# Patient Record
Sex: Female | Born: 1960 | Race: Black or African American | Hispanic: No | Marital: Single | Smoking: Never smoker
Health system: Southern US, Community
[De-identification: ages and names within clinical notes are randomized; demographics above are authoritative.]

## PROBLEM LIST (undated history)

## (undated) DIAGNOSIS — K219 Gastro-esophageal reflux disease without esophagitis: Secondary | ICD-10-CM

## (undated) DIAGNOSIS — R062 Wheezing: Secondary | ICD-10-CM

## (undated) DIAGNOSIS — M1990 Unspecified osteoarthritis, unspecified site: Secondary | ICD-10-CM

## (undated) DIAGNOSIS — N938 Other specified abnormal uterine and vaginal bleeding: Secondary | ICD-10-CM

## (undated) DIAGNOSIS — Z79818 Long term (current) use of other agents affecting estrogen receptors and estrogen levels: Secondary | ICD-10-CM

## (undated) DIAGNOSIS — Z923 Personal history of irradiation: Secondary | ICD-10-CM

## (undated) DIAGNOSIS — D649 Anemia, unspecified: Secondary | ICD-10-CM

## (undated) DIAGNOSIS — D250 Submucous leiomyoma of uterus: Secondary | ICD-10-CM

## (undated) DIAGNOSIS — D219 Benign neoplasm of connective and other soft tissue, unspecified: Secondary | ICD-10-CM

## (undated) DIAGNOSIS — N841 Polyp of cervix uteri: Secondary | ICD-10-CM

## (undated) DIAGNOSIS — I10 Essential (primary) hypertension: Secondary | ICD-10-CM

## (undated) DIAGNOSIS — C50911 Malignant neoplasm of unspecified site of right female breast: Secondary | ICD-10-CM

## (undated) DIAGNOSIS — T7840XA Allergy, unspecified, initial encounter: Secondary | ICD-10-CM

## (undated) DIAGNOSIS — R0981 Nasal congestion: Secondary | ICD-10-CM

## (undated) HISTORY — DX: Polyp of cervix uteri: N84.1

## (undated) HISTORY — PX: VAGINAL HYSTERECTOMY: SUR661

## (undated) HISTORY — DX: Nasal congestion: R09.81

## (undated) HISTORY — PX: TONSILLECTOMY: SUR1361

## (undated) HISTORY — DX: Allergy, unspecified, initial encounter: T78.40XA

## (undated) HISTORY — DX: Essential (primary) hypertension: I10

## (undated) HISTORY — DX: Wheezing: R06.2

## (undated) HISTORY — DX: Unspecified osteoarthritis, unspecified site: M19.90

## (undated) HISTORY — DX: Gastro-esophageal reflux disease without esophagitis: K21.9

## (undated) HISTORY — DX: Other specified abnormal uterine and vaginal bleeding: N93.8

## (undated) HISTORY — DX: Submucous leiomyoma of uterus: D25.0

## (undated) HISTORY — DX: Anemia, unspecified: D64.9

## (undated) HISTORY — DX: Benign neoplasm of connective and other soft tissue, unspecified: D21.9

## (undated) HISTORY — DX: Malignant neoplasm of unspecified site of right female breast: C50.911

---

## 1998-04-18 HISTORY — PX: TOOTH EXTRACTION: SUR596

## 2000-10-30 ENCOUNTER — Other Ambulatory Visit: Admission: RE | Admit: 2000-10-30 | Discharge: 2000-10-30 | Payer: Self-pay | Admitting: Obstetrics and Gynecology

## 2001-11-27 ENCOUNTER — Other Ambulatory Visit: Admission: RE | Admit: 2001-11-27 | Discharge: 2001-11-27 | Payer: Self-pay | Admitting: Obstetrics and Gynecology

## 2003-01-10 ENCOUNTER — Other Ambulatory Visit: Admission: RE | Admit: 2003-01-10 | Discharge: 2003-01-10 | Payer: Self-pay | Admitting: Obstetrics and Gynecology

## 2004-03-31 ENCOUNTER — Other Ambulatory Visit: Admission: RE | Admit: 2004-03-31 | Discharge: 2004-03-31 | Payer: Self-pay | Admitting: Obstetrics and Gynecology

## 2005-04-20 ENCOUNTER — Other Ambulatory Visit: Admission: RE | Admit: 2005-04-20 | Discharge: 2005-04-20 | Payer: Self-pay | Admitting: Obstetrics and Gynecology

## 2006-07-14 ENCOUNTER — Other Ambulatory Visit: Admission: RE | Admit: 2006-07-14 | Discharge: 2006-07-14 | Payer: Self-pay | Admitting: Obstetrics and Gynecology

## 2007-08-24 ENCOUNTER — Other Ambulatory Visit: Admission: RE | Admit: 2007-08-24 | Discharge: 2007-08-24 | Payer: Self-pay | Admitting: Obstetrics and Gynecology

## 2009-03-04 ENCOUNTER — Ambulatory Visit: Payer: Self-pay | Admitting: Obstetrics and Gynecology

## 2009-03-04 ENCOUNTER — Other Ambulatory Visit: Admission: RE | Admit: 2009-03-04 | Discharge: 2009-03-04 | Payer: Self-pay | Admitting: Obstetrics and Gynecology

## 2010-05-06 ENCOUNTER — Ambulatory Visit: Admit: 2010-05-06 | Payer: Self-pay | Admitting: Obstetrics and Gynecology

## 2010-06-17 ENCOUNTER — Encounter: Payer: Self-pay | Admitting: Obstetrics and Gynecology

## 2011-01-05 ENCOUNTER — Other Ambulatory Visit: Payer: Self-pay | Admitting: Obstetrics and Gynecology

## 2011-02-02 ENCOUNTER — Encounter: Payer: Self-pay | Admitting: Gynecology

## 2011-02-02 DIAGNOSIS — J45909 Unspecified asthma, uncomplicated: Secondary | ICD-10-CM | POA: Insufficient documentation

## 2011-02-15 ENCOUNTER — Ambulatory Visit: Payer: Self-pay | Admitting: Obstetrics and Gynecology

## 2011-05-06 ENCOUNTER — Other Ambulatory Visit: Payer: Self-pay | Admitting: Obstetrics and Gynecology

## 2011-05-10 ENCOUNTER — Other Ambulatory Visit: Payer: Self-pay | Admitting: Obstetrics and Gynecology

## 2011-06-28 ENCOUNTER — Encounter: Payer: Self-pay | Admitting: Obstetrics and Gynecology

## 2011-10-03 ENCOUNTER — Telehealth: Payer: Self-pay | Admitting: *Deleted

## 2011-10-03 NOTE — Telephone Encounter (Signed)
Pt calling with advice about irregular cycle, last OV 2010, pt informed to make annual with Dr.G, pt will call back and make ov.

## 2011-10-12 ENCOUNTER — Other Ambulatory Visit: Payer: Self-pay | Admitting: Obstetrics and Gynecology

## 2011-10-13 NOTE — Telephone Encounter (Signed)
Has CE scheduled 12/18/11.

## 2011-10-14 ENCOUNTER — Encounter: Payer: Self-pay | Admitting: Obstetrics and Gynecology

## 2011-10-17 ENCOUNTER — Other Ambulatory Visit (HOSPITAL_COMMUNITY)
Admission: RE | Admit: 2011-10-17 | Discharge: 2011-10-17 | Disposition: A | Discharge status: Home / Self Care | Payer: BC Managed Care – PPO | Source: Ambulatory Visit | Attending: Obstetrics and Gynecology | Admitting: Obstetrics and Gynecology

## 2011-10-17 ENCOUNTER — Ambulatory Visit (INDEPENDENT_AMBULATORY_CARE_PROVIDER_SITE_OTHER): Payer: BC Managed Care – PPO | Admitting: Obstetrics and Gynecology

## 2011-10-17 ENCOUNTER — Encounter: Payer: Self-pay | Admitting: Obstetrics and Gynecology

## 2011-10-17 VITALS — BP 130/80 | Ht 68.0 in | Wt 316.0 lb

## 2011-10-17 DIAGNOSIS — Z01419 Encounter for gynecological examination (general) (routine) without abnormal findings: Secondary | ICD-10-CM | POA: Insufficient documentation

## 2011-10-17 DIAGNOSIS — N949 Unspecified condition associated with female genital organs and menstrual cycle: Secondary | ICD-10-CM

## 2011-10-17 DIAGNOSIS — D649 Anemia, unspecified: Secondary | ICD-10-CM | POA: Insufficient documentation

## 2011-10-17 DIAGNOSIS — N938 Other specified abnormal uterine and vaginal bleeding: Secondary | ICD-10-CM

## 2011-10-17 HISTORY — PX: OTHER SURGICAL HISTORY: SHX169

## 2011-10-17 LAB — CBC WITH DIFFERENTIAL/PLATELET
Basophils Relative: 0 % (ref 0–1)
Eosinophils Absolute: 0.1 10*3/uL (ref 0.0–0.7)
Eosinophils Relative: 1 % (ref 0–5)
Hemoglobin: 8 g/dL — ABNORMAL LOW (ref 12.0–15.0)
MCH: 19.2 pg — ABNORMAL LOW (ref 26.0–34.0)
MCHC: 29.1 g/dL — ABNORMAL LOW (ref 30.0–36.0)
MCV: 66.1 fL — ABNORMAL LOW (ref 78.0–100.0)
Monocytes Relative: 6 % (ref 3–12)
Neutrophils Relative %: 66 % (ref 43–77)

## 2011-10-17 MED ORDER — ETHYNODIOL DIAC-ETH ESTRADIOL 1-35 MG-MCG PO TABS: 1.0000 | ORAL_TABLET | Freq: Every day | ORAL | Status: DC

## 2011-10-17 NOTE — Progress Notes (Signed)
Patient came to see me today for her annual GYN exam. We had last seen her in the fall of 2010. We had her on Kelnor for birth control and cycle control. She was seen after this at the [facility]. Initially they switched her to Micronor due to her age. She could not tolerate due to nausea and bleeding. They suggested she try  a Mirena IUD. She was not comfortable with that due to previous history of PID. She then called our office and spoke with Harriett Sine. Harriett Sine spoke with me and we switched her to Sprintec. She says that Sprintec is not as good as the [employer] was. She is having extremely heavy periods with clotting and dysmenorrhea. It is gone especially worse this year. She became anemic and her hemoglobin got as low as 7. She has been on ferrous sulfate and went back up to 10.3. She says it was rechecked and the stropped to 9. She actually had a syncopal episode and fainted in January, 2013. She thinks she's become anemic again due to fatigue, easy bruising, and ice craving. She has had a mammogram this week. We did a Pap smear in November, 2010 which was normal. She also had a Pap smear at the health department in April 2012 which was normal. She is not having pelvic pain.  Physical examination: Kennon Portela present. HEENT within normal limits. Neck: Thyroid not large. No masses. Supraclavicular nodes: not enlarged. Breasts: Examined in both sitting and lying  position. No skin changes and no masses. Abdomen: Soft no guarding rebound or masses or hernia. Pelvic: External: Within normal limits. BUS: Within normal limits. Vaginal:within normal limits. Good estrogen effect. No evidence of cystocele rectocele or enterocele. Cervix: large polyp extruding from the os. Uterus: Normal size and shape. Adnexa: No masses. Rectovaginal exam: Confirmatory and negative.  Assessment: #1. Menorrhagia with dysmenorrhea #2. Endocervical polyp  Plan: Polyp removed and sent to pathology. [facility] scheduled. CBC and  TSH done. Information on endometrial ablation given the patient. Patient restarted on Kelnor. Extremities: Within normal limits.

## 2011-10-18 ENCOUNTER — Other Ambulatory Visit: Payer: Self-pay | Admitting: Obstetrics and Gynecology

## 2011-10-18 DIAGNOSIS — D649 Anemia, unspecified: Secondary | ICD-10-CM

## 2011-10-18 HISTORY — DX: Anemia, unspecified: D64.9

## 2011-10-18 LAB — URINALYSIS W MICROSCOPIC + REFLEX CULTURE
Glucose, UA: NEGATIVE mg/dL
Hgb urine dipstick: NEGATIVE
Leukocytes, UA: NEGATIVE
Protein, ur: NEGATIVE mg/dL

## 2011-10-19 ENCOUNTER — Other Ambulatory Visit: Payer: Self-pay | Admitting: *Deleted

## 2011-10-19 DIAGNOSIS — N390 Urinary tract infection, site not specified: Secondary | ICD-10-CM

## 2011-10-19 MED ORDER — NITROFURANTOIN MONOHYD MACRO 100 MG PO CAPS: 100.0000 mg | ORAL_CAPSULE | Freq: Two times a day (BID) | ORAL | Status: AC

## 2011-10-19 NOTE — Progress Notes (Signed)
rx called into [facility] [phone].

## 2011-10-21 LAB — URINE CULTURE: Colony Count: >=100000

## 2011-10-25 ENCOUNTER — Other Ambulatory Visit: Payer: Self-pay | Admitting: Obstetrics and Gynecology

## 2011-10-25 DIAGNOSIS — N938 Other specified abnormal uterine and vaginal bleeding: Secondary | ICD-10-CM

## 2011-10-26 ENCOUNTER — Ambulatory Visit: Payer: BC Managed Care – PPO | Admitting: Obstetrics and Gynecology

## 2011-10-26 ENCOUNTER — Other Ambulatory Visit: Payer: BC Managed Care – PPO

## 2011-10-26 ENCOUNTER — Encounter: Payer: Self-pay | Admitting: Obstetrics and Gynecology

## 2011-10-31 ENCOUNTER — Encounter: Payer: Self-pay | Admitting: *Deleted

## 2011-10-31 NOTE — Progress Notes (Signed)
Patient ID: Kayla Neal, female   DOB: 04-02-1961, 51 y.o.   MRN: [id number] Order signed and faxed to [facility] for diag. Mammo. Right breast calcifications.

## 2011-11-03 ENCOUNTER — Encounter (INDEPENDENT_AMBULATORY_CARE_PROVIDER_SITE_OTHER): Payer: Self-pay

## 2011-11-07 ENCOUNTER — Encounter (INDEPENDENT_AMBULATORY_CARE_PROVIDER_SITE_OTHER): Payer: Self-pay

## 2011-11-11 ENCOUNTER — Encounter (INDEPENDENT_AMBULATORY_CARE_PROVIDER_SITE_OTHER): Payer: Self-pay | Admitting: Surgery

## 2011-11-11 ENCOUNTER — Other Ambulatory Visit (INDEPENDENT_AMBULATORY_CARE_PROVIDER_SITE_OTHER): Payer: Self-pay | Admitting: Surgery

## 2011-11-11 ENCOUNTER — Ambulatory Visit (INDEPENDENT_AMBULATORY_CARE_PROVIDER_SITE_OTHER): Payer: BC Managed Care – PPO | Admitting: Surgery

## 2011-11-11 ENCOUNTER — Encounter (HOSPITAL_BASED_OUTPATIENT_CLINIC_OR_DEPARTMENT_OTHER): Payer: Self-pay | Admitting: *Deleted

## 2011-11-11 VITALS — BP 130/72 | HR 70 | Temp 97.6°F | Resp 16 | Ht 68.0 in | Wt 316.0 lb

## 2011-11-11 DIAGNOSIS — R928 Other abnormal and inconclusive findings on diagnostic imaging of breast: Secondary | ICD-10-CM

## 2011-11-11 DIAGNOSIS — R921 Mammographic calcification found on diagnostic imaging of breast: Secondary | ICD-10-CM

## 2011-11-11 DIAGNOSIS — C50911 Malignant neoplasm of unspecified site of right female breast: Secondary | ICD-10-CM | POA: Insufficient documentation

## 2011-11-11 HISTORY — DX: Malignant neoplasm of unspecified site of right female breast: C50.911

## 2011-11-11 NOTE — Patient Instructions (Signed)
We will schedule surgery to remove the area of calcifications in your right breast

## 2011-11-11 NOTE — Progress Notes (Signed)
Kayla Neal DOB: 1961-03-06 MRN: [id number]                                                                                      DATE: 11/11/2011  PCP: [name], MD Referring Provider: No ref. provider found  IMPRESSION:  Right breast calcifications, indeterminate but suspicious for DCIS  PLAN:   Wire localized excision of right breast calcifications under general anesthesia as an outpatient                 CC:  Chief Complaint  Patient presents with  . Breast Problem    Breast calcifications, right    HPI:  Kayla Neal is a 51 y.o.  female who presents for evaluation of Recently found right breast calcifications on a routine mammogram. These are moderately suspicious linear calcifications. A needle core biopsy was not possible because the patient's weight is 2 large for the stereotactic wire-guided table. Therefore surgical biopsy was recommended. Of note is that she has a mother who had colon cancer in her 36s, and a maternal aunt with ovarian cancer in a paternal aunt with breast cancer, both postmenopausal  PMH:  has a past medical history of Asthma; Anemia; GERD (gastroesophageal reflux disease); Hypertension; Nasal congestion; Wheezing; and Arthritis.  PSH:   has past surgical history that includes Tooth extraction (2000).  ALLERGIES:   Allergies  Allergen Reactions  . Lisinopril Nausea And Vomiting and Other (See Comments)    Severe stomach cramps    MEDICATIONS: Current outpatient prescriptions:Ferrous Sulfate (IRON) 325 (65 FE) MG TABS, Take by mouth 3 (three) times daily., Disp: , Rfl: ;  Multiple Vitamins-Minerals (ONE-A-DAY MENOPAUSE FORMULA PO), Take by mouth daily., Disp: , Rfl: ;  ALBUTEROL IN, Inhale into the lungs.  , Disp: , Rfl: ;  ethynodiol-ethinyl estradiol (KELNOR 1/35) 1-35 MG-MCG tablet, Take 1 tablet by mouth daily., Disp: 1 Package, Rfl: 13 EVENING PRIMROSE OIL PO, Take by mouth., Disp: , Rfl: ;  Flaxseed, Linseed, (FLAXSEED OIL PO),  Take by mouth.  , Disp: , Rfl:   ROS: Point review of systems was negative except for some nasal congestion and wheezing associated with her mild asthma  EXAM:   VITAL SIGNS: BP 130/72  Pulse 70  Temp 97.6 F (36.4 C) (Temporal)  Resp 16  Ht 5\' 8"  (1.727 m)  Wt 316 lb (143.337 kg)  BMI 48.05 kg/m2  LMP 10/09/2011 GENERAL:  The patient is alert, oriented, and generally healthy-appearing, NAD. Mood and affect are normal.  HEENT:  The head is normocephalic, the eyes nonicteric, the pupils were round regular and equal. EOMs are normal. Pharynx normal. Dentition good.  NECK:  The neck is supple and there are no masses or thyromegaly.  LUNGS: Normal respirations and clear to auscultation.  HEART: Regular rhythm, with no murmurs rubs or gallops. Pulses are intact carotid dorsalis pedis and posterior tibial. No significant varicosities are noted.  BREASTS:  The breasts are symmetric in appearance. They're normal to inspection and palpation. There is no evidence of any suspicious areas.  LYMPHATICS: There is no axillary or supraventricular adenopathy noted.  ABDOMEN: Soft, flat, and nontender. No  masses or organomegaly is noted. No hernias are noted. Bowel sounds are normal.  EXTREMITIES:  Good range of motion, no edema.   DATA REVIEWED:  I have reviewed the mammogram reports, some laboratory studies are found electronic medical record, and office visit with her gynecologist.    Currie Paris 11/11/2011  CC: No ref. provider found, [name], MD

## 2011-11-14 ENCOUNTER — Encounter (HOSPITAL_BASED_OUTPATIENT_CLINIC_OR_DEPARTMENT_OTHER): Payer: Self-pay | Admitting: Certified Registered"

## 2011-11-14 ENCOUNTER — Ambulatory Visit (HOSPITAL_BASED_OUTPATIENT_CLINIC_OR_DEPARTMENT_OTHER)
Admission: RE | Admit: 2011-11-14 | Discharge: 2011-11-14 | Disposition: A | Discharge status: Home / Self Care | Payer: BC Managed Care – PPO | Source: Ambulatory Visit | Attending: Surgery | Admitting: Surgery

## 2011-11-14 ENCOUNTER — Ambulatory Visit (HOSPITAL_BASED_OUTPATIENT_CLINIC_OR_DEPARTMENT_OTHER): Payer: BC Managed Care – PPO | Admitting: Certified Registered"

## 2011-11-14 ENCOUNTER — Encounter (HOSPITAL_BASED_OUTPATIENT_CLINIC_OR_DEPARTMENT_OTHER): Payer: Self-pay | Admitting: *Deleted

## 2011-11-14 ENCOUNTER — Encounter (HOSPITAL_BASED_OUTPATIENT_CLINIC_OR_DEPARTMENT_OTHER)
Admission: RE | Disposition: A | Discharge status: Home / Self Care | Payer: Self-pay | Source: Ambulatory Visit | Attending: Surgery

## 2011-11-14 ENCOUNTER — Ambulatory Visit
Admission: RE | Admit: 2011-11-14 | Discharge: 2011-11-14 | Disposition: A | Discharge status: Home / Self Care | Payer: BC Managed Care – PPO | Source: Ambulatory Visit | Attending: Surgery | Admitting: Surgery

## 2011-11-14 DIAGNOSIS — D0590 Unspecified type of carcinoma in situ of unspecified breast: Secondary | ICD-10-CM

## 2011-11-14 DIAGNOSIS — R921 Mammographic calcification found on diagnostic imaging of breast: Secondary | ICD-10-CM

## 2011-11-14 DIAGNOSIS — K219 Gastro-esophageal reflux disease without esophagitis: Secondary | ICD-10-CM | POA: Insufficient documentation

## 2011-11-14 DIAGNOSIS — I10 Essential (primary) hypertension: Secondary | ICD-10-CM | POA: Insufficient documentation

## 2011-11-14 HISTORY — PX: BREAST BIOPSY: SHX20

## 2011-11-14 HISTORY — PX: BREAST LUMPECTOMY: SHX2

## 2011-11-14 SURGERY — BREAST BIOPSY WITH NEEDLE LOCALIZATION
Anesthesia: General | Site: Breast | Laterality: Right | Wound class: Clean

## 2011-11-14 MED ORDER — PROPOFOL 10 MG/ML IV EMUL
INTRAVENOUS | Status: DC | PRN
  Administered 2011-11-14: 260 mg via INTRAVENOUS
  Administered 2011-11-14: 40 mg via INTRAVENOUS

## 2011-11-14 MED ORDER — MIDAZOLAM HCL 5 MG/5ML IJ SOLN
INTRAMUSCULAR | Status: DC | PRN
  Administered 2011-11-14: 2 mg via INTRAVENOUS

## 2011-11-14 MED ORDER — CHLORHEXIDINE GLUCONATE 4 % EX LIQD: 1.0000 "application " | Freq: Once | CUTANEOUS | Status: DC

## 2011-11-14 MED ORDER — HYDROMORPHONE HCL PF 1 MG/ML IJ SOLN
0.2500 mg | INTRAMUSCULAR | Status: DC | PRN
  Administered 2011-11-14: 0.25 mg via INTRAVENOUS

## 2011-11-14 MED ORDER — LIDOCAINE HCL (CARDIAC) 20 MG/ML IV SOLN
INTRAVENOUS | Status: DC | PRN
  Administered 2011-11-14: 100 mg via INTRAVENOUS

## 2011-11-14 MED ORDER — ONDANSETRON HCL 4 MG/2ML IJ SOLN
INTRAMUSCULAR | Status: DC | PRN
  Administered 2011-11-14: 4 mg via INTRAVENOUS

## 2011-11-14 MED ORDER — PROMETHAZINE HCL 25 MG/ML IJ SOLN: 6.2500 mg | INTRAMUSCULAR | Status: DC | PRN

## 2011-11-14 MED ORDER — BUPIVACAINE HCL (PF) 0.25 % IJ SOLN
INTRAMUSCULAR | Status: DC | PRN
  Administered 2011-11-14: 30 mL

## 2011-11-14 MED ORDER — OXYCODONE-ACETAMINOPHEN 5-325 MG PO TABS: 1.0000 | ORAL_TABLET | ORAL | Status: DC | PRN

## 2011-11-14 MED ORDER — FENTANYL CITRATE 0.05 MG/ML IJ SOLN: 50.0000 ug | INTRAMUSCULAR | Status: DC | PRN

## 2011-11-14 MED ORDER — FENTANYL CITRATE 0.05 MG/ML IJ SOLN
INTRAMUSCULAR | Status: DC | PRN
  Administered 2011-11-14: 25 ug via INTRAVENOUS
  Administered 2011-11-14: 100 ug via INTRAVENOUS
  Administered 2011-11-14: 25 ug via INTRAVENOUS

## 2011-11-14 MED ORDER — LACTATED RINGERS IV SOLN
INTRAVENOUS | Status: DC
  Administered 2011-11-14: 10:00:00 via INTRAVENOUS

## 2011-11-14 MED ORDER — DEXTROSE 5 % IV SOLN
3.0000 g | INTRAVENOUS | Status: AC
  Administered 2011-11-14: 3 g via INTRAVENOUS

## 2011-11-14 MED ORDER — DEXAMETHASONE SODIUM PHOSPHATE 4 MG/ML IJ SOLN
INTRAMUSCULAR | Status: DC | PRN
  Administered 2011-11-14: 8 mg via INTRAVENOUS

## 2011-11-14 MED ORDER — LORAZEPAM 2 MG/ML IJ SOLN: 1.0000 mg | Freq: Once | INTRAMUSCULAR | Status: DC | PRN

## 2011-11-14 MED ORDER — MIDAZOLAM HCL 2 MG/2ML IJ SOLN: 1.0000 mg | INTRAMUSCULAR | Status: DC | PRN

## 2011-11-14 SURGICAL SUPPLY — 52 items
ADH SKN CLS APL DERMABOND .7 (GAUZE/BANDAGES/DRESSINGS) ×1
APPLICATOR COTTON TIP 6IN STRL (MISCELLANEOUS) IMPLANT
BINDER BREAST LRG (GAUZE/BANDAGES/DRESSINGS) IMPLANT
BINDER BREAST MEDIUM (GAUZE/BANDAGES/DRESSINGS) IMPLANT
BINDER BREAST XLRG (GAUZE/BANDAGES/DRESSINGS) IMPLANT
BINDER BREAST XXLRG (GAUZE/BANDAGES/DRESSINGS) IMPLANT
BLADE HEX COATED 2.75 (ELECTRODE) ×2 IMPLANT
BLADE SURG 15 STRL LF DISP TIS (BLADE) ×1 IMPLANT
BLADE SURG 15 STRL SS (BLADE) ×2
CANISTER SUCTION 1200CC (MISCELLANEOUS) ×2 IMPLANT
CHLORAPREP W/TINT 26ML (MISCELLANEOUS) ×2 IMPLANT
CLIP TI MEDIUM 6 (CLIP) IMPLANT
CLIP TI WIDE RED SMALL 6 (CLIP) IMPLANT
CLOTH BEACON ORANGE TIMEOUT ST (SAFETY) ×2 IMPLANT
COVER MAYO STAND STRL (DRAPES) ×2 IMPLANT
COVER TABLE BACK 60X90 (DRAPES) ×2 IMPLANT
DECANTER SPIKE VIAL GLASS SM (MISCELLANEOUS) ×1 IMPLANT
DERMABOND ADVANCED (GAUZE/BANDAGES/DRESSINGS) ×1
DERMABOND ADVANCED .7 DNX12 (GAUZE/BANDAGES/DRESSINGS) ×1 IMPLANT
DEVICE DUBIN W/COMP PLATE 8390 (MISCELLANEOUS) ×1 IMPLANT
DRAPE LAPAROTOMY TRNSV 102X78 (DRAPE) ×2 IMPLANT
DRAPE UTILITY XL STRL (DRAPES) ×2 IMPLANT
ELECT REM PT RETURN 9FT ADLT (ELECTROSURGICAL) ×2
ELECTRODE REM PT RTRN 9FT ADLT (ELECTROSURGICAL) ×1 IMPLANT
GLOVE BIO SURGEON STRL SZ 6.5 (GLOVE) ×1 IMPLANT
GLOVE BIO SURGEON STRL SZ7 (GLOVE) ×1 IMPLANT
GLOVE BIOGEL PI IND STRL 7.0 (GLOVE) IMPLANT
GLOVE BIOGEL PI IND STRL 7.5 (GLOVE) IMPLANT
GLOVE BIOGEL PI INDICATOR 7.0 (GLOVE) ×1
GLOVE BIOGEL PI INDICATOR 7.5 (GLOVE) ×1
GLOVE EUDERMIC 7 POWDERFREE (GLOVE) ×2 IMPLANT
GOWN PREVENTION PLUS XLARGE (GOWN DISPOSABLE) ×4 IMPLANT
GOWN PREVENTION PLUS XXLARGE (GOWN DISPOSABLE) ×1 IMPLANT
KIT MARKER MARGIN INK (KITS) ×1 IMPLANT
NDL HYPO 25X1 1.5 SAFETY (NEEDLE) ×1 IMPLANT
NEEDLE HYPO 25X1 1.5 SAFETY (NEEDLE) ×2 IMPLANT
NS IRRIG 1000ML POUR BTL (IV SOLUTION) IMPLANT
PACK BASIN DAY SURGERY FS (CUSTOM PROCEDURE TRAY) ×2 IMPLANT
PENCIL BUTTON HOLSTER BLD 10FT (ELECTRODE) ×2 IMPLANT
SLEEVE SCD COMPRESS KNEE MED (MISCELLANEOUS) ×2 IMPLANT
SPONGE GAUZE 4X4 12PLY (GAUZE/BANDAGES/DRESSINGS) IMPLANT
SPONGE INTESTINAL PEANUT (DISPOSABLE) IMPLANT
SPONGE LAP 4X18 X RAY DECT (DISPOSABLE) ×2 IMPLANT
STAPLER VISISTAT 35W (STAPLE) IMPLANT
SUT MNCRL AB 4-0 PS2 18 (SUTURE) ×2 IMPLANT
SUT SILK 0 TIES 10X30 (SUTURE) IMPLANT
SUT VICRYL 3-0 CR8 SH (SUTURE) ×2 IMPLANT
SYR CONTROL 10ML LL (SYRINGE) ×2 IMPLANT
TOWEL OR NON WOVEN STRL DISP B (DISPOSABLE) ×2 IMPLANT
TUBE CONNECTING 20X1/4 (TUBING) ×2 IMPLANT
WATER STERILE IRR 1000ML POUR (IV SOLUTION) ×1 IMPLANT
YANKAUER SUCT BULB TIP NO VENT (SUCTIONS) ×2 IMPLANT

## 2011-11-14 NOTE — H&P (View-Only) (Signed)
NAME: Kayla Neal DOB: 12/08/1960 MRN: [id number]                                                                                      DATE: 11/11/2011  PCP: GOTTSEGEN,DANIEL L, MD Referring Provider: No ref. provider found  IMPRESSION:  Right breast calcifications, indeterminate but suspicious for DCIS  PLAN:   Wire localized excision of right breast calcifications under general anesthesia as an outpatient                 CC:  Chief Complaint  Patient presents with  . Breast Problem    Breast calcifications, right    HPI:  Kayla Neal is a 51 y.o.  female who presents for evaluation of Recently found right breast calcifications on a routine mammogram. These are moderately suspicious linear calcifications. A needle core biopsy was not possible because the patient's weight is 2 large for the stereotactic wire-guided table. Therefore surgical biopsy was recommended. Of note is that she has a mother who had colon cancer in her 70s, and a maternal aunt with ovarian cancer in a paternal aunt with breast cancer, both postmenopausal  PMH:  has a past medical history of Asthma; Anemia; GERD (gastroesophageal reflux disease); Hypertension; Nasal congestion; Wheezing; and Arthritis.  PSH:   has past surgical history that includes Tooth extraction (2000).  ALLERGIES:   Allergies  Allergen Reactions  . Lisinopril Nausea And Vomiting and Other (See Comments)    Severe stomach cramps    MEDICATIONS: Current outpatient prescriptions:Ferrous Sulfate (IRON) 325 (65 FE) MG TABS, Take by mouth 3 (three) times daily., Disp: , Rfl: ;  Multiple Vitamins-Minerals (ONE-A-DAY MENOPAUSE FORMULA PO), Take by mouth daily., Disp: , Rfl: ;  ALBUTEROL IN, Inhale into the lungs.  , Disp: , Rfl: ;  ethynodiol-ethinyl estradiol (KELNOR 1/35) 1-35 MG-MCG tablet, Take 1 tablet by mouth daily., Disp: 1 Package, Rfl: 13 EVENING PRIMROSE OIL PO, Take by mouth., Disp: , Rfl: ;  Flaxseed, Linseed, (FLAXSEED OIL PO),  Take by mouth.  , Disp: , Rfl:   ROS: Point review of systems was negative except for some nasal congestion and wheezing associated with her mild asthma  EXAM:   VITAL SIGNS: BP 130/72  Pulse 70  Temp 97.6 F (36.4 C) (Temporal)  Resp 16  Ht 5' 8" (1.727 m)  Wt 316 lb (143.337 kg)  BMI 48.05 kg/m2  LMP 10/09/2011 GENERAL:  The patient is alert, oriented, and generally healthy-appearing, NAD. Mood and affect are normal.  HEENT:  The head is normocephalic, the eyes nonicteric, the pupils were round regular and equal. EOMs are normal. Pharynx normal. Dentition good.  NECK:  The neck is supple and there are no masses or thyromegaly.  LUNGS: Normal respirations and clear to auscultation.  HEART: Regular rhythm, with no murmurs rubs or gallops. Pulses are intact carotid dorsalis pedis and posterior tibial. No significant varicosities are noted.  BREASTS:  The breasts are symmetric in appearance. They're normal to inspection and palpation. There is no evidence of any suspicious areas.  LYMPHATICS: There is no axillary or supraventricular adenopathy noted.  ABDOMEN: Soft, flat, and nontender. No   masses or organomegaly is noted. No hernias are noted. Bowel sounds are normal.  EXTREMITIES:  Good range of motion, no edema.   DATA REVIEWED:  I have reviewed the mammogram reports, some laboratory studies are found electronic medical record, and office visit with her gynecologist.    Yukari Flax J 11/11/2011  CC: No ref. provider found, GOTTSEGEN,DANIEL L, MD        

## 2011-11-14 NOTE — Anesthesia Procedure Notes (Signed)
Procedure Name: LMA Insertion Date/Time: 11/14/2011 11:02 AM Performed by: Verlan Friends Pre-anesthesia Checklist: Patient identified, Emergency Drugs available, Suction available, Patient being monitored and Timeout performed Patient Re-evaluated:Patient Re-evaluated prior to inductionOxygen Delivery Method: Circle System Utilized Preoxygenation: Pre-oxygenation with 100% oxygen Intubation Type: IV induction Ventilation: Mask ventilation without difficulty LMA: LMA with gastric port inserted LMA Size: 4.0 Number of attempts: 1 Placement Confirmation: positive ETCO2 Tube secured with: Tape Dental Injury: Teeth and Oropharynx as per pre-operative assessment

## 2011-11-14 NOTE — Op Note (Signed)
KIMIE PIDCOCK  [birth year]  [id number]  11/14/2011   Preoperative diagnosis: Suspicious calcifications, upper inner quadrant right breast  Postoperative diagnosis: same  Procedure: Wire loc excisional biopsy  Surgeon: Currie Paris, MD, FACS  Assistant: Charissa Bash, MS III  Anesthesia: General  Clinical History and Indications: this patient presents for a guidewire localized excision of a reght breast area of suspicious calcifications.  Description of procedure: The patient was seen in the holding area and the plans for the procedure reviewed. The right breast was marked as the operative side. The wire localizing films were reviewed.  The patient was taken to the operating room and after satisfactory general anesthesia had been obtained the right breast was prepped and draped and the timeout was performed.  The incision was made over the presumed area of the mass. There were two wires to bracket the area, both entered medial and traced laterally.Skin flaps were raised and using cautery the area was completely excised. Bleeders were controlled with either cautery or sutures as needed.  After achieving hemostasis, the incision was closed with 3-0 Vicryl, 4-0 Monocryl subcuticular, and Dermabond. 30 cc of 0.25% Marcaine was placed before closing.  Specimen mammogram was reviewed and the area in question appeared to have been removed  The patient tolerated the procedure well. There were no operative complications. All counts were correct.   EBL: Minimal  Currie Paris, MD, FACS 11/14/2011 11:54 AM

## 2011-11-14 NOTE — Transfer of Care (Signed)
Immediate Anesthesia Transfer of Care Note  Patient: Kayla Neal  Procedure(s) Performed: Procedure(s) (LRB): BREAST BIOPSY WITH NEEDLE LOCALIZATION (Right)  Patient Location: PACU  Anesthesia Type: General  Level of Consciousness: awake, alert , oriented and patient cooperative  Airway & Oxygen Therapy: Patient Spontanous Breathing and Patient connected to face mask oxygen  Post-op Assessment: Report given to PACU RN and Post -op Vital signs reviewed and stable  Post vital signs: Reviewed and stable  Complications: No apparent anesthesia complications

## 2011-11-14 NOTE — Progress Notes (Signed)
Dr Jamey Ripa notified of pt request for change of pain medication from roxicet to [facility]. Dr Jamey Ripa will call script in to [facility] pharmacy in [place].

## 2011-11-14 NOTE — Anesthesia Preprocedure Evaluation (Signed)
Anesthesia Evaluation  Patient identified by MRN, date of birth, ID band Patient awake    Reviewed: Allergy & Precautions, H&P , NPO status , Patient's Chart, lab work & pertinent test results  Airway Mallampati: II TM Distance: >3 FB Neck ROM: Full    Dental   Pulmonary asthma ,    Pulmonary exam normal       Cardiovascular hypertension,     Neuro/Psych    GI/Hepatic GERD-  ,  Endo/Other  Morbid obesity  Renal/GU      Musculoskeletal   Abdominal (+) + obese,   Peds  Hematology   Anesthesia Other Findings   Reproductive/Obstetrics                           Anesthesia Physical Anesthesia Plan  ASA: II  Anesthesia Plan: General   Post-op Pain Management:    Induction: Intravenous  Airway Management Planned: LMA  Additional Equipment:   Intra-op Plan:   Post-operative Plan: Extubation in OR  Informed Consent: I have reviewed the patients History and Physical, chart, labs and discussed the procedure including the risks, benefits and alternatives for the proposed anesthesia with the patient or authorized representative who has indicated his/her understanding and acceptance.     Plan Discussed with: CRNA and Surgeon  Anesthesia Plan Comments:         Anesthesia Quick Evaluation

## 2011-11-14 NOTE — Interval H&P Note (Signed)
History and Physical Interval Note:  11/14/2011 10:35 AM  Kayla Neal  has presented today for surgery, with the diagnosis of right breast calcification  The various methods of treatment have been discussed with the patient and family. After consideration of risks, benefits and other options for treatment, the patient has consented to  Procedure(s) (LRB): BREAST BIOPSY WITH NEEDLE LOCALIZATION (Right) as a surgical intervention .  The patient's history has been reviewed, patient examined, no change in status, stable for surgery.  I have reviewed the patient's chart and labs.  Questions were answered to the patient's satisfaction.   Right breast marked as the operative side. Wire loc films reviewed  [employer]

## 2011-11-14 NOTE — Anesthesia Postprocedure Evaluation (Signed)
  Anesthesia Post-op Note  Patient: Kayla Neal  Procedure(s) Performed: Procedure(s) (LRB): BREAST BIOPSY WITH NEEDLE LOCALIZATION (Right)  Patient Location: PACU  Anesthesia Type: General  Level of Consciousness: awake  Airway and Oxygen Therapy: Patient Spontanous Breathing  Post-op Pain: mild  Post-op Assessment: Post-op Vital signs reviewed, Patient's Cardiovascular Status Stable, Respiratory Function Stable, Patent Airway, No signs of Nausea or vomiting and Pain level controlled  Post-op Vital Signs: stable  Complications: No apparent anesthesia complications

## 2011-11-15 ENCOUNTER — Telehealth (INDEPENDENT_AMBULATORY_CARE_PROVIDER_SITE_OTHER): Payer: Self-pay | Admitting: Surgery

## 2011-11-15 ENCOUNTER — Encounter (HOSPITAL_BASED_OUTPATIENT_CLINIC_OR_DEPARTMENT_OTHER): Payer: Self-pay | Admitting: Surgery

## 2011-11-15 DIAGNOSIS — C50911 Malignant neoplasm of unspecified site of right female breast: Secondary | ICD-10-CM

## 2011-11-15 NOTE — Telephone Encounter (Signed)
Tried to call patient to give her an appt for next week. No answer. Referral made to [facility].

## 2011-11-15 NOTE — Addendum Note (Signed)
Addended byLiliana Cline on: 11/15/2011 12:55 PM   Modules accepted: Orders

## 2011-11-15 NOTE — Telephone Encounter (Signed)
I spoke with her today, the day after surgery. She is doing well except for problems with the incision or problems with pain.  I reviewed her pathology report. 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 Margins were negative although the deep margin was 2 mm. I reviewed that with her.  We will go ahead and make medical and radiation oncology consultation appointments. She is scheduled to see me in about 3 weeks I'll try to move that up for next week so we can discuss in person the pathology report

## 2011-11-18 ENCOUNTER — Encounter: Payer: Self-pay | Admitting: Obstetrics and Gynecology

## 2011-11-23 ENCOUNTER — Ambulatory Visit (INDEPENDENT_AMBULATORY_CARE_PROVIDER_SITE_OTHER): Payer: BC Managed Care – PPO | Admitting: Surgery

## 2011-11-23 ENCOUNTER — Encounter (INDEPENDENT_AMBULATORY_CARE_PROVIDER_SITE_OTHER): Payer: Self-pay | Admitting: Surgery

## 2011-11-23 VITALS — BP 148/90 | HR 76 | Resp 18 | Ht 68.0 in | Wt 315.0 lb

## 2011-11-23 DIAGNOSIS — C50919 Malignant neoplasm of unspecified site of unspecified female breast: Secondary | ICD-10-CM

## 2011-11-23 DIAGNOSIS — C50911 Malignant neoplasm of unspecified site of right female breast: Secondary | ICD-10-CM

## 2011-11-23 NOTE — Addendum Note (Signed)
Addended byLiliana Cline on: 11/23/2011 04:27 PM   Modules accepted: Orders

## 2011-11-23 NOTE — Patient Instructions (Addendum)
I need to see you again in about 3 weeks for a followup visit. We will try to arrange medical and radiation Oncologyconsultations for you

## 2011-11-23 NOTE — Progress Notes (Signed)
NAME: Kayla Neal                                            DOB: 1961/03/01 DATE: 11/23/2011                                                  MRN: [id number]  CC: Post op   HPI: This patient comes in for post op follow-up.Sheunderwent Wire localized excision of some right breast calcifications upper inner quadrant on 11/14/2011. She feels that she is doing well.  PE:  VITAL SIGNS: BP 148/90  Pulse 76  Resp 18  Ht 5\' 8"  (1.727 m)  Wt 315 lb (142.883 kg)  BMI 47.90 kg/m2  LMP 10/30/2011  General: The patient appears to be healthy, NAD The incision is healing nicely with no evidence of any problems.  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    PLAN: She'll have another postop visit with me in about 3 weeks. I reviewed the pathology report and gave her a copy. We will arrange both medical and radiation therapy consultations for her.

## 2011-11-28 ENCOUNTER — Ambulatory Visit (INDEPENDENT_AMBULATORY_CARE_PROVIDER_SITE_OTHER): Payer: Self-pay | Admitting: Surgery

## 2011-12-01 ENCOUNTER — Ambulatory Visit (INDEPENDENT_AMBULATORY_CARE_PROVIDER_SITE_OTHER): Payer: BC Managed Care – PPO | Admitting: Obstetrics and Gynecology

## 2011-12-01 ENCOUNTER — Encounter: Payer: Self-pay | Admitting: Obstetrics and Gynecology

## 2011-12-01 VITALS — BP 124/78

## 2011-12-01 DIAGNOSIS — N841 Polyp of cervix uteri: Secondary | ICD-10-CM | POA: Insufficient documentation

## 2011-12-01 DIAGNOSIS — N938 Other specified abnormal uterine and vaginal bleeding: Secondary | ICD-10-CM

## 2011-12-01 DIAGNOSIS — N949 Unspecified condition associated with female genital organs and menstrual cycle: Secondary | ICD-10-CM

## 2011-12-01 LAB — CBC WITH DIFFERENTIAL/PLATELET
Eosinophils Absolute: 0 10*3/uL (ref 0.0–0.7)
Eosinophils Relative: 1 % (ref 0–5)
Lymphs Abs: 1 10*3/uL (ref 0.7–4.0)
MCH: 23.6 pg — ABNORMAL LOW (ref 26.0–34.0)
MCV: 76 fL — ABNORMAL LOW (ref 78.0–100.0)
Platelets: 314 10*3/uL (ref 150–400)
RBC: 4.41 MIL/uL (ref 3.87–5.11)

## 2011-12-01 MED ORDER — MEGESTROL ACETATE 40 MG PO TABS: 40.0000 mg | ORAL_TABLET | Freq: Four times a day (QID) | ORAL | Status: DC

## 2011-12-01 NOTE — Progress Notes (Signed)
Patient came to see me today because of sudden onset of heavy bleeding. She has been intermittently anemic over the past year with her low hemoglobin being 7 in  January. She had been on one iron a day and I had increased her in July to 3 a day. Over the past several months her hemoglobin had bounced  around from 8 tiv10. It was last checked at the end of July when she had a breast biopsy which showed ductal carcinoma in situ. That day it was 10.3. She has been on birth control pills both for control of her cycles and contraception. This is her placebo week and she suddenly started heavy bleeding this morning passing clots every hour. She is getting lightheaded. Her blood pressure was normal at office. Her pulse was 84. She had been scheduled for saline infusion histogram on August 23 prior to today's events and the abnormal breast biopsy. She has many appointments with oncologist and radiologist to decide  on her treatment.  Exam:Pelvic exam: External within normal limits. BUS within normal limits. Vaginal exam within normal limits. Cervix is clean without lesions. There is moderate bleeding from the cervical os without clots.  Uterus is top normal size and shape. Adnexa failed to reveal masses. Rectovaginal examination is confirmatory and without masses.   Assessment #1. DUB with anemia. #2. Heavy placebo week bleeding today  Plan: Patient looks hemodynamically stable. She has tolerated a low hemoglobin now for 1 year. Her CBC was rechecked today. She will rest and stay off her feet. She will continue 3 iron a day. She will start Megace 40 mg 4 times a day and given 4 doses today. She will stay on four  a day until bleeding stopped for 24 hours and then to go to 3 a day. She will call me Tuesday and report. She will call sooner if symptoms increase. She will not restart her birth control pills because of breast cancer. She will either abstain from intercourse will use condoms.

## 2011-12-01 NOTE — Patient Instructions (Signed)
Take four Megace today. Continue four daily until bleeding stops. Then  reduce to 3 a day. Continue iron 3 a day. Do not restart birth control pills. Call me on Tuesday and report. Call sooner for increasing symptoms.

## 2011-12-02 ENCOUNTER — Other Ambulatory Visit: Payer: Self-pay | Admitting: *Deleted

## 2011-12-02 ENCOUNTER — Telehealth: Payer: Self-pay | Admitting: Women's Health

## 2011-12-02 DIAGNOSIS — D0510 Intraductal carcinoma in situ of unspecified breast: Secondary | ICD-10-CM

## 2011-12-02 NOTE — Telephone Encounter (Signed)
Telephone call for followup, had office appointment with Dr. Eda Paschal on 12/01/11 for menorrhagia and was prescribed Megace. States bleeding has greatly decreased, no further episodes of vertigo.

## 2011-12-05 ENCOUNTER — Ambulatory Visit (INDEPENDENT_AMBULATORY_CARE_PROVIDER_SITE_OTHER): Payer: BC Managed Care – PPO | Admitting: Surgery

## 2011-12-05 ENCOUNTER — Encounter (INDEPENDENT_AMBULATORY_CARE_PROVIDER_SITE_OTHER): Payer: Self-pay | Admitting: Surgery

## 2011-12-05 VITALS — BP 146/88 | HR 72 | Temp 97.0°F | Ht 68.5 in | Wt 313.0 lb

## 2011-12-05 DIAGNOSIS — Z09 Encounter for follow-up examination after completed treatment for conditions other than malignant neoplasm: Secondary | ICD-10-CM

## 2011-12-05 NOTE — Patient Instructions (Signed)
See me again after radiation 

## 2011-12-05 NOTE — Progress Notes (Signed)
NAME: Kayla Neal                                            DOB: 08/05/1960 DATE: 12/05/2011                                                  MRN: [id number]  CC: Post op   HPI: This patient comes in for post op follow-up.Sheunderwent Wire localized excision of some right breast calcifications upper inner quadrant on 11/14/2011. She feels that she is doing well. This is her second post op visit.  PE:  VITAL SIGNS: BP 146/88  Pulse 72  Temp 97 F (36.1 C) (Temporal)  Ht 5' 8.5" (1.74 m)  Wt 313 lb (141.976 kg)  BMI 46.90 kg/m2  SpO2 97%  LMP 11/30/2011  General: The patient appears to be healthy, NAD The incision is healing nicely with no evidence of any problems.  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    PLAN: She had med and rad onc visits pending. Will see me again in about three months.

## 2011-12-09 ENCOUNTER — Ambulatory Visit (INDEPENDENT_AMBULATORY_CARE_PROVIDER_SITE_OTHER): Payer: BC Managed Care – PPO

## 2011-12-09 ENCOUNTER — Other Ambulatory Visit: Payer: Self-pay | Admitting: *Deleted

## 2011-12-09 ENCOUNTER — Ambulatory Visit (INDEPENDENT_AMBULATORY_CARE_PROVIDER_SITE_OTHER): Payer: BC Managed Care – PPO | Admitting: Obstetrics and Gynecology

## 2011-12-09 DIAGNOSIS — D219 Benign neoplasm of connective and other soft tissue, unspecified: Secondary | ICD-10-CM

## 2011-12-09 DIAGNOSIS — Z853 Personal history of malignant neoplasm of breast: Secondary | ICD-10-CM

## 2011-12-09 DIAGNOSIS — N949 Unspecified condition associated with female genital organs and menstrual cycle: Secondary | ICD-10-CM

## 2011-12-09 DIAGNOSIS — N938 Other specified abnormal uterine and vaginal bleeding: Secondary | ICD-10-CM

## 2011-12-09 DIAGNOSIS — D259 Leiomyoma of uterus, unspecified: Secondary | ICD-10-CM

## 2011-12-09 DIAGNOSIS — C50919 Malignant neoplasm of unspecified site of unspecified female breast: Secondary | ICD-10-CM

## 2011-12-09 DIAGNOSIS — D649 Anemia, unspecified: Secondary | ICD-10-CM

## 2011-12-09 HISTORY — PX: ENDOMETRIAL BIOPSY: SHX622

## 2011-12-09 NOTE — Patient Instructions (Signed)
Please ask Dr. Welton Flakes to call me.

## 2011-12-09 NOTE — Progress Notes (Signed)
Patient came back today for saline infusion histogram. She completely stop bleeding with the Megace and is now on 3 a day. Her hemoglobin last week was 10. She feels much better. Ultrasound was done. The patient's uterus is enlarged by multiple fibroids. Her endometrial echo is 7.6 mm. It appears that one of the fibroids involves the endometrial cavity. Both her ovaries are normal. Her cul-de-sac is free of fluid. A catheter was placed in her uterus. Saline was injected. She does have a submucous myoma which is not confined to the endometrial cavity. Endometrial biopsy was obtained.  Assessment: Severe dysfunctional uterine bleeding with anemia. Multiple fibroids including submucous myoma.  Plan: I do not believe she is a candidate for either Mirena IUD, endometrial ablation, or hysteroscopic myomectomy based on the above. I believe she will require hysterectomy for treatment. I think it might be possible to do an LAVH on her although she does not have any uterine prolapse. The question now is whether she should have her breast cancer treatment first or after surgery. She is going to see Dr. Park Breed on Monday. I think the plan was for radiation treatment for her ductal carcinoma in situ. She will stand her Megace 40 mg twice a day. She knows she can increase that if needed. I've asked to have Dr. Park Breed call me Monday.

## 2011-12-12 ENCOUNTER — Ambulatory Visit (HOSPITAL_BASED_OUTPATIENT_CLINIC_OR_DEPARTMENT_OTHER): Payer: BC Managed Care – PPO | Admitting: Oncology

## 2011-12-12 ENCOUNTER — Encounter: Payer: Self-pay | Admitting: Oncology

## 2011-12-12 ENCOUNTER — Ambulatory Visit: Payer: BC Managed Care – PPO

## 2011-12-12 ENCOUNTER — Telehealth: Payer: Self-pay | Admitting: Oncology

## 2011-12-12 ENCOUNTER — Other Ambulatory Visit (HOSPITAL_BASED_OUTPATIENT_CLINIC_OR_DEPARTMENT_OTHER): Payer: BC Managed Care – PPO | Admitting: Lab

## 2011-12-12 VITALS — BP 145/85 | HR 76 | Temp 98.4°F | Resp 20 | Ht 69.0 in | Wt 313.5 lb

## 2011-12-12 DIAGNOSIS — N926 Irregular menstruation, unspecified: Secondary | ICD-10-CM

## 2011-12-12 DIAGNOSIS — D0590 Unspecified type of carcinoma in situ of unspecified breast: Secondary | ICD-10-CM

## 2011-12-12 DIAGNOSIS — C50919 Malignant neoplasm of unspecified site of unspecified female breast: Secondary | ICD-10-CM

## 2011-12-12 DIAGNOSIS — D649 Anemia, unspecified: Secondary | ICD-10-CM

## 2011-12-12 DIAGNOSIS — N939 Abnormal uterine and vaginal bleeding, unspecified: Secondary | ICD-10-CM

## 2011-12-12 DIAGNOSIS — Z170 Estrogen receptor positive status [ER+]: Secondary | ICD-10-CM

## 2011-12-12 DIAGNOSIS — C50911 Malignant neoplasm of unspecified site of right female breast: Secondary | ICD-10-CM

## 2011-12-12 LAB — CBC WITH DIFFERENTIAL/PLATELET
Basophils Absolute: 0 10*3/uL (ref 0.0–0.1)
HCT: 33.6 % — ABNORMAL LOW (ref 34.8–46.6)
HGB: 10.4 g/dL — ABNORMAL LOW (ref 11.6–15.9)
MONO#: 0.4 10*3/uL (ref 0.1–0.9)
NEUT#: 3.3 10*3/uL (ref 1.5–6.5)
NEUT%: 53.8 % (ref 38.4–76.8)
WBC: 6.1 10*3/uL (ref 3.9–10.3)
lymph#: 2.3 10*3/uL (ref 0.9–3.3)

## 2011-12-12 LAB — COMPREHENSIVE METABOLIC PANEL (CC13)
ALT: 13 U/L (ref 0–55)
Albumin: 3.7 g/dL (ref 3.5–5.0)
CO2: 26 mEq/L (ref 22–29)
Chloride: 107 mEq/L (ref 98–107)
Glucose: 88 mg/dl (ref 70–99)
Potassium: 3.9 mEq/L (ref 3.5–5.1)
Sodium: 141 mEq/L (ref 136–145)

## 2011-12-12 NOTE — Telephone Encounter (Signed)
gve the pt her sept,oct 2013 appt calendar

## 2011-12-12 NOTE — Progress Notes (Signed)
Kayla Neal [id number] 1960/11/16 51 y.o. 12/12/2011 3:24 PM  CC  [name], MD [address] [place] [address] Dr. 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 Patient is status post lumpectomy. She is now being seen in medical oncology for discussion of treatment options.  STAGE:   Breast cancer, right breast, UIQ, DCIS   Primary site: Breast (Right)   Staging method: AJCC 7th Edition   Pathologic: Stage 0 (Tis, NX, cM0) signed by Currie Paris, MD on 11/23/2011  3:58 PM   Summary: Stage 0 (Tis, NX, cM0)  REFERRING PHYSICIAN: Dr. Cyndia Bent.  HISTORY OF PRESENT ILLNESS:  Kayla Neal is a 51 y.o. female.  With medical history significant for fibroid tumors anemia secondary to dysfunctional uterine bleeding and asthma. Patient recently presented for her scheduled mammogram and was found to have calcifications in the upper inner quadrant of the right breast. She went on to have a needle core biopsy that showed a ductal carcinoma in situ high grade. She was seen by Dr. Cyndia Bent he recommended wire localized excisional Vioxx he which was performed on 11/14/2011. The final pathology revealed a 2.4 cm ductal carcinoma in situ with calcifications high-grade all surgical margins were negative for DCIS. The tumor was estrogen receptor +100% progesterone receptor +80%. Postoperatively patient has done well she is now seen in medical oncology for discussion of adjuvant treatment options. She will also be referred to radiation oncology for discussion of post lumpectomy radiation.  The patient also has history of significant dysfunctional uterine bleeding leading to anemia. She has a history of fibroids. She had been on apparently birth control pills but these were taken off and she is currently on Megestrol 40 mg 4 times a day To reduce  the amount of uterine bleeding that patient is experiencing. Eventual hysterectomy is planned by Dr. Eda Paschal.  Recommendations for treatment of ductal carcinoma in situ are made based on NCCN guidelines for stage 0 breast cancer.  Past Medical History: Past Medical History  Diagnosis Date  . Nasal congestion   . Wheezing   . Hypertension     under control without medication as of 11/11/11-no meds for  at least 1 yr  . Asthma     seasonal allergies related-prn albuterol  . GERD (gastroesophageal reflux disease)     no meds required in past 3 years  . Arthritis     knee pain-uses acetaminophen for pain control prn  . Anemia 10/18/2011    placed on iron tid for Hgb of 8  . 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    . Cervical polyp     Past Surgical History: Past Surgical History  Procedure Date  . Tooth extraction 2000  . Breast biopsy 11/14/2011    Procedure: BREAST BIOPSY WITH NEEDLE LOCALIZATION;  Surgeon: Currie Paris, MD;  Location: [facility];  Service: General;  Laterality: Right;  needle localization right breast calcifications    Family History: Family History  Problem Relation Age of Onset  . Diabetes Mother   . Hypertension Mother   . Cancer Mother     Colon cancer  . Heart disease Mother   . Hypertension Father   . Breast cancer Maternal Aunt     Age 48's  . Cancer Maternal Aunt  breast  . Cancer Paternal Aunt     Ovarian cancer  . Hypertension Brother     Social History History  Substance Use Topics  . Smoking status: Never Smoker   . Smokeless tobacco: Never Used  . Alcohol Use: Yes     rare 1 or 2 times per month    Allergies: Allergies  Allergen Reactions  . Lisinopril Nausea And Vomiting and Other (See Comments)    Severe stomach cramps    Current Medications: Current Outpatient Prescriptions  Medication Sig Dispense Refill  . Acetaminophen 650 MG TABS  Take 1-2 tablets by mouth as needed.      . ALBUTEROL IN Inhale into the lungs as needed.       Marland Kitchen EVENING PRIMROSE OIL PO Take by mouth daily.       . Ferrous Sulfate (IRON) 325 (65 FE) MG TABS Take by mouth 3 (three) times daily.      Marland Kitchen HYDROcodone-acetaminophen (NORCO/VICODIN) 5-325 MG per tablet       . megestrol (MEGACE) 40 MG tablet Take 1 tablet (40 mg total) by mouth 4 (four) times daily.  40 tablet  1  . Multiple Vitamins-Minerals (ONE-A-DAY MENOPAUSE FORMULA PO) Take by mouth daily.      . Flaxseed, Linseed, (FLAXSEED OIL PO) Take by mouth.          OB/GYN History: Menarche at Age 22 last menstrual period was on 12/01/2011. Patient is G0 P0 she has never been pregnant. She is now trying to become pregnant.  Fertility Discussion: Patient does not want to become pregnant Prior History of Cancer:. She has no prior history of cancers.  Health Maintenance:  Colonoscopy No Bone Density No Last PAP smear 10/17/2011.  Her first mammogram was in 2003.  ECOG PERFORMANCE STATUS: 0 - Asymptomatic  Genetic Counseling/testing:A complete family history was obtained patient's father died at age 66 mother died at age 2 patient has 2 brothers one sister. Patient's mother had colon cancer at the age of 58 paternal aunt had ovarian cancer at the age of 76 paternal aunt had breast cancer at the age of 92 and a maternal aunt had breast cancer at the age of 35. Based on this family history I have recommended genetic counseling and testing and a referral has been made to [facility] our Dentist.  REVIEW OF SYSTEMS:  A comprehensive review of systems was negative.  PHYSICAL EXAMINATION: Blood pressure 145/85, pulse 76, temperature 98.4 F (36.9 C), resp. rate 20, height 5\' 9"  (1.753 m), weight 313 lb 8 oz (142.203 kg), last menstrual period 11/30/2011.  Exam EOMI PERRLA sclerae anicteric no conjunctival pallor oral mucosa is moist neck is supple no palpable cervical supraclavicular or  axillary adenopathy lungs are clear to auscultation and percussion cardiovascular is regular rate rhythm no murmurs gallops or rubs abdomen is soft nontender nondistended bowel sounds are present no hepatosplenomegaly extremities no clubbing edema or cyanosis neuro patient's alert oriented otherwise nonfocal right breast reveals a healed incisional scar no nodularity. No nipple discharge. Left breast no masses or nipple discharge. Skin no rashes.   STUDIES/RESULTS: Mm Breast Surgical Specimen  11/14/2011  *RADIOLOGY REPORT*  Clinical Data:  Suspicious right breast calcifications.  The patient exceeded the weight limited of the stereotactic table therefore surgical excision was recommended.  NEEDLE LOCALIZATION WITH MAMMOGRAPHIC GUIDANCE AND SPECIMEN RADIOGRAPH  Comparison:  With priors  Patient presents for needle localization prior to surgery.  I met with the patient and we discussed the  procedure of needle localization including benefits and alternatives. We discussed the high likelihood of a successful procedure. We discussed the risks of the procedure, including infection, bleeding, tissue injury, and further surgery. Informed, written consent was given.  Using mammographic guidance, sterile technique, 2% lidocaine and two #9 modified Kopans needle, the calcifications in the upper inner quadrant of the right breast were bracketed using a superior approach.  The films are marked for Dr. Jamey Ripa.  Specimen radiograph was performed at [facility], and confirms the calcifications are present in the tissue sample.  The specimen is marked for pathology.  IMPRESSION: Needle localization the right breast.  No apparent complications.  Original Report Authenticated By: Littie Deeds. Judyann Munson, M.D.   Mm Breast Wire Localization Right  11/14/2011  *RADIOLOGY REPORT*  Clinical Data:  Suspicious right breast calcifications.  The patient exceeded the weight limited of the stereotactic table therefore surgical excision was  recommended.  NEEDLE LOCALIZATION WITH MAMMOGRAPHIC GUIDANCE AND SPECIMEN RADIOGRAPH  Comparison:  With priors  Patient presents for needle localization prior to surgery.  I met with the patient and we discussed the procedure of needle localization including benefits and alternatives. We discussed the high likelihood of a successful procedure. We discussed the risks of the procedure, including infection, bleeding, tissue injury, and further surgery. Informed, written consent was given.  Using mammographic guidance, sterile technique, 2% lidocaine and two #9 modified Kopans needle, the calcifications in the upper inner quadrant of the right breast were bracketed using a superior approach.  The films are marked for Dr. Jamey Ripa.  Specimen radiograph was performed at [facility], and confirms the calcifications are present in the tissue sample.  The specimen is marked for pathology.  IMPRESSION: Needle localization the right breast.  No apparent complications.  Original Report Authenticated By: Littie Deeds. ARCEO, M.D.     LABS:    Chemistry   No results found for this basename: NA, K, CL, CO2, BUN, CREATININE, GLU   No results found for this basename: CALCIUM, ALKPHOS, AST, ALT, BILITOT      Lab Results  Component Value Date   WBC 6.1 12/12/2011   HGB 10.4* 12/12/2011   HCT 33.6* 12/12/2011   MCV 76.7* 12/12/2011   PLT 352 12/12/2011    PATHOLOGY: ADDITIONAL INFORMATION: PROGNOSTIC INDICATORS - ACIS Results IMMUNOHISTOCHEMICAL AND MORPHOMETRIC ANALYSIS BY THE AUTOMATED CELLULAR IMAGING SYSTEM (ACIS) Estrogen Receptor (Negative, <1%): 100%, POSITIVE,STRONG STAINING INTENSITY Progesterone Receptor (Negative, <1%): 80%, POSITIVE, WEAK STAINING INTENSITY All controls stained appropriately Abigail Miyamoto MD Pathologist, Electronic Signature ( Signed 11/18/2011) FINAL DIAGNOSIS Diagnosis Breast, lumpectomy, right - DUCTAL CARCINOMA IN SITU WITH CALCIFICATIONS, HIGH GRADE, SPANNING AT LEAST 2.4 CM. -  THE SURGICAL RESECTION MARGINS ARE NEGATIVE FOR DUCTAL CARCINOMA. - SEE ONCOLOGY TABLE BELOW. Microscopic Comment BREAST, IN SITU CARCINOMA Specimen, including laterality: Right breast Procedure: Needle localized lumpectomy Grade of carcinoma: High grade Necrosis: Present Estimated tumor size: (glass slide measurement): 2.4 cm 1 of 2 FINAL for JAXSON, KEENER [phone]) Microscopic Comment(continued) Treatment effect: N/A Distance to closest margin: 0.2 cm to the posterior margin (glass slide measurement). Breast prognostic profile: Will be performed on the current case and the results reported separately. TNM: pTis, pNX Comments: The case was discussed with Dr. Jamey Ripa on 11/15/2011. (JBK:kh 11/15/2011) Pecola Leisure MD Pathologist, Electronic Signature (Case signed 11/15/2011) Specimen Gross and ASSESSMENT    51 year old female with  #1 new diagnosis of 2.4 cm ductal carcinoma in situ of the right breast status post lumpectomy that revealed  a 2.4 cm high-grade DCIS all surgical margins were negative tumor was ER +100% PR +80%. Patient is now status post lumpectomy. She is a good candidate for adjuvant antiestrogen therapy with tamoxifen 20 mg daily. Because she does have DCIS she is also eligible for our clinical study NSABP B. 43 for her DCIS. However I do believe that she plans on having her radiation performed at another site.  #2 because patient has had a lumpectomy she will get radiation and she is referred to radiation oncology.  #3 because of patient's strong family history of malignancy I have referred her to our genetic counselor for testing for possible ability of BRCA1 and 2 gene mutations.  #3 patient with dysfunctional uterine bleeding leading to anemia. Patient's hemoglobin has risen quite a bit. However she is on Megace and will soon be tapered down. There is plan for doing a hysterectomy. At this time my recommendation is to hold off on the hysterectomy until patient has  her genetic testing performed if she is BRCA positive then certainly she will need to have bilateral salpingo-oophorectomy with hysterectomy. If she is BRCA negative then certainly she could proceed with hysterectomy only. In terms of timing of the hysterectomy I would want her to have the hysterectomy prior to her going on antiestrogen therapy with tamoxifen.   PLAN:    #1 patient will be referred to radiation oncology for consultation.  #2 referral to [facility] for genetic counseling and testing for BRCA1 and BRCA2 gene mutations.  #3 if patient is BRCA one and 2 mutation carrier negative then she will proceed with her hysterectomy first. If the patient is found to be BRCA positive then I would recommend that she proceed with having bilateral salpingo-oophorectomy and hysterectomy.  #4Certainly her radiation could be timed based on recommendations by the radiation oncologist      Thank you so much for allowing me to participate in the care of Kayla Neal. I will continue to follow up the patient with you and assist in her care.  All questions were answered. The patient knows to call the clinic with any problems, questions or concerns. We can certainly see the patient much sooner if necessary.  I spent 60 minutes counseling the patient face to face. The total time spent in the appointment was 60 minutes.  Drue Second, MD Medical/Oncology [facility] [phone] (beeper) [phone] (Office)  12/12/2011, 3:24 PM

## 2011-12-12 NOTE — Patient Instructions (Addendum)
You have DCIS treatment radiation followed by tamoxifen  With family history of breast and ovarian cancer suggest genetic counseling and possible testing  I will see you back in 2-3 months

## 2011-12-15 ENCOUNTER — Encounter: Payer: Self-pay | Admitting: *Deleted

## 2011-12-15 NOTE — Progress Notes (Signed)
Mailed after appt letter to pt. 

## 2011-12-16 ENCOUNTER — Encounter: Payer: Self-pay | Admitting: Radiation Oncology

## 2011-12-16 ENCOUNTER — Ambulatory Visit
Admission: RE | Admit: 2011-12-16 | Discharge: 2011-12-16 | Disposition: A | Discharge status: Home / Self Care | Payer: BC Managed Care – PPO | Source: Ambulatory Visit | Attending: Radiation Oncology | Admitting: Radiation Oncology

## 2011-12-16 VITALS — BP 143/80 | HR 87 | Temp 97.9°F | Wt 315.3 lb

## 2011-12-16 DIAGNOSIS — C50211 Malignant neoplasm of upper-inner quadrant of right female breast: Secondary | ICD-10-CM | POA: Insufficient documentation

## 2011-12-16 DIAGNOSIS — J45909 Unspecified asthma, uncomplicated: Secondary | ICD-10-CM | POA: Insufficient documentation

## 2011-12-16 DIAGNOSIS — D0590 Unspecified type of carcinoma in situ of unspecified breast: Secondary | ICD-10-CM | POA: Insufficient documentation

## 2011-12-16 DIAGNOSIS — I10 Essential (primary) hypertension: Secondary | ICD-10-CM | POA: Insufficient documentation

## 2011-12-16 DIAGNOSIS — K219 Gastro-esophageal reflux disease without esophagitis: Secondary | ICD-10-CM | POA: Insufficient documentation

## 2011-12-16 DIAGNOSIS — C50911 Malignant neoplasm of unspecified site of right female breast: Secondary | ICD-10-CM

## 2011-12-16 DIAGNOSIS — Z79899 Other long term (current) drug therapy: Secondary | ICD-10-CM | POA: Insufficient documentation

## 2011-12-16 HISTORY — DX: Long term (current) use of other agents affecting estrogen receptors and estrogen levels: Z79.818

## 2011-12-16 NOTE — Addendum Note (Signed)
Encounter addended by: Zaryia Markel Mintz Quang Thorpe, RN on: 12/16/2011  5:45 PM<BR>     Documentation filed: Charges VN

## 2011-12-16 NOTE — Progress Notes (Signed)
Here for [place] Visit 

## 2011-12-16 NOTE — Addendum Note (Signed)
Encounter addended by: Delynn Flavin, RN on: 12/16/2011  5:15 PM<BR>     Documentation filed: Charges VN

## 2011-12-16 NOTE — Progress Notes (Signed)
[facility] today for evaluation of radiation in the treatment of her DCIS of her right breast.  No voiced concerns. Has experienced heavy menses, therefore, she has been placed on Megace to counteract her anemia.  Scheduled for genetic testing and is planned for a hysterectomy by Dr. Eda Paschal.

## 2011-12-16 NOTE — Progress Notes (Signed)
Radiation Oncology         [phone]) [phone] ________________________________  Initial outpatient Consultation  Name: Kayla Neal MRN: [id number]  Date: 12/16/2011  DOB: 08-Jun-1960  FA:[id number],IONGEX L, MD  Streck, Reola Mosher, MD   REFERRING PHYSICIAN: Currie Paris, MD  DIAGNOSIS: right upper inner quadrant DCIS, ER/PR +, high grade, with necrosis  HISTORY OF PRESENT ILLNESS::Kayla Neal is a 51 y.o. female who was found on screening mammography ( 10/14/2011)  to have right breast calcifications which were ultimately found to be consistent with DCIS. She underwent lumpectomy on 7-29 revealing ER/PR positive high-grade DCIS with necrosis. The spanned at least 2.4 cm. Margins were clear by at least 2 mm. The patient has a significant family history but has not yet undergone genetic counseling. This is pending for September 9. She has a history of uterine fibroids and has had excessive bleeding with plans to eventually undergo hysterectomy. She may undergo bilateral salpingo-oophorectomy if her genetics is positive. She is taking Megace; she has been anemic from her bleeding.  The patient lives near [facility] and is conflicted about whether she would like to get treated here or in [place] if she eventually needs radiotherapy.  PREVIOUS RADIATION THERAPY: No  PAST MEDICAL HISTORY:  has a past medical history of Nasal congestion; Wheezing; Hypertension; Asthma; GERD (gastroesophageal reflux disease); Arthritis; Cervical polyp; Breast cancer, right breast, UIQ, DCIS (11/11/2011); Allergy; Anemia (10/18/2011); Fibroids; Uterine bleeding, dysfunctional; Submucous myoma of uterus; and Use of megestrol acetate (Megace).    PAST SURGICAL HISTORY: Past Surgical History  Procedure Date  . Tooth extraction 2000  . Breast biopsy 11/14/2011    Procedure: BREAST BIOPSY WITH NEEDLE LOCALIZATION;  Surgeon: Currie Paris, MD;  Location: [facility];  Service: General;   Laterality: Right;  needle localization right breast calcifications  . Breast lumpectomy 11/14/11    RIGHT  BREAST=DUCTAL CA IN SITU,W/CALCIFICATIONS,HIGH GRADE,2.4CM,SURGICAL RESECTION MARGINS NEG,  DR. C. STRECK  . Endometrial biopsy 12/09/11    UTERUS=BENIGN ENDOMETRIAL GLANDS,BENIGN SQUAMOUS EPITHELIAL CELLS,NO DYSPLASIA OR MALIGNANCY  . Cervix polyp 10/17/11    NO MALIGNANCY    FAMILY HISTORY: family history includes Breast cancer in her maternal aunt; Cancer in her maternal aunt, mother, and paternal aunt; Diabetes in her mother; Heart disease in her mother; and Hypertension in her brother, father, and mother.  SOCIAL HISTORY:  reports that she has never smoked. She has never used smokeless tobacco. She reports that she drinks alcohol. She reports that she does not use illicit drugs.  ALLERGIES: Lisinopril and Bee pollen  MEDICATIONS:  Current Outpatient Prescriptions  Medication Sig Dispense Refill  . Acetaminophen 650 MG TABS Take 1-2 tablets by mouth as needed.      . ALBUTEROL IN Inhale into the lungs as needed.       Marland Kitchen EVENING PRIMROSE OIL PO Take by mouth daily.       . Ferrous Sulfate (IRON) 325 (65 FE) MG TABS Take by mouth 3 (three) times daily.      Marland Kitchen HYDROcodone-acetaminophen (NORCO/VICODIN) 5-325 MG per tablet       . megestrol (MEGACE) 40 MG tablet Take 40 mg by mouth 2 (two) times daily.      . Multiple Vitamins-Minerals (ONE-A-DAY MENOPAUSE FORMULA PO) Take by mouth daily.      . Flaxseed, Linseed, (FLAXSEED OIL PO) Take by mouth.          REVIEW OF SYSTEMS:  Notable for that above  PHYSICAL EXAM:  weight is 315 lb 4.8 oz (143.019 kg). Her temperature is 97.9 F (36.6 C). Her blood pressure is 143/80 and her pulse is 87.   General: Alert and oriented, in no acute distress HEENT: Head is normocephalic. Pupils are equally round and reactive to light. Extraocular movements are intact. Oropharynx is clear. Neck: Neck is supple, no palpable cervical or supraclavicular  lymphadenopathy. Heart: Regular in rate and rhythm with no murmurs, rubs, or gallops. Chest: Clear to auscultation bilaterally, with no rhonchi, wheezes, or rales. Abdomen: Soft, nontender, nondistended, with no rigidity or guarding. Extremities: No cyanosis or edema. Lymphatics: No concerning lymphadenopathy. Skin: No concerning lesions. Musculoskeletal: symmetric strength and muscle tone throughout. Neurologic: Cranial nerves II through XII are grossly intact. No obvious focalities. Speech is fluent. Coordination is intact. Psychiatric: Judgment and insight are intact. Affect is appropriate. Breasts: Right breast lumpectomy scar has healed well. No concerning lesions palpable in either breast. No palpable axillary adenopathy. Her breasts are large. I think she could be treated in the supine position with a Styrofoam wedge at her inframammary fold or alternatively in the prone position.   LABORATORY DATA:  Lab Results  Component Value Date   WBC 6.1 12/12/2011   HGB 10.4* 12/12/2011   HCT 33.6* 12/12/2011   MCV 76.7* 12/12/2011   PLT 352 12/12/2011   Lab Results  Component Value Date   NA 141 12/12/2011   K 3.9 12/12/2011   CL 107 12/12/2011   CO2 26 12/12/2011   Lab Results  Component Value Date   ALT 13 12/12/2011   AST 16 12/12/2011   ALKPHOS 90 12/12/2011   BILITOT 0.20 12/12/2011      RADIOGRAPHY: As above    IMPRESSION/PLAN: This is a very pleasant 51 year old woman high-grade DCIS  1) the patient will undergo genetics counseling on September 9. I will see see this to our genetics counselor to see if her appointment can be moved up and see if the results can be expedited. She may be a candidate for bilateral mastectomy if her results are positive. This would effect her plan of care significantly. This will also affect whether her ovaries will be removed when she undergoes hysterectomy.  2) if she ends up conserving her breast, she is an excellent candidate for radiotherapy. I  talked to the patient about our services at [facility] and informed her of Dr. Thersa Salt who will be starting there is a full-time physician at the end of September. She will think about whether she wants to get her treatment there or here and we will discuss that further once the results from her genetics counseling is available.  3) We talked about the risks benefits and side effects of radiotherapy to the right breast and she understands to take place over approximately 6 weeks. We talked about skin irritation and fatigue associated with treatment and less common long-term effects including inflammation to the lung and swelling of the arm. We talked about cosmetic changes to the breast as well. I did not go over a consent form with the patient today as as such a consent form would not not be valid at [facility], should she ultimately receive her treatment there.  4) I would like to get input from radiology as to whether a repeat mammogram is necessary to rule out residual calcifications if she undergoes radiotherapy.  5) in light of all the issues above, I have asked for the patient to be put on our  tumor board scheduled next week   _________________________________________   Lonie Peak, MD

## 2011-12-22 ENCOUNTER — Telehealth: Payer: Self-pay | Admitting: *Deleted

## 2011-12-22 NOTE — Telephone Encounter (Signed)
Left message for pt to return my call about rescheduling her genetic appt.

## 2011-12-26 ENCOUNTER — Encounter: Payer: Self-pay | Admitting: Genetic Counselor

## 2011-12-26 ENCOUNTER — Ambulatory Visit (HOSPITAL_BASED_OUTPATIENT_CLINIC_OR_DEPARTMENT_OTHER): Payer: BC Managed Care – PPO | Admitting: Genetic Counselor

## 2011-12-26 ENCOUNTER — Other Ambulatory Visit: Payer: BC Managed Care – PPO | Admitting: Lab

## 2011-12-26 DIAGNOSIS — Z803 Family history of malignant neoplasm of breast: Secondary | ICD-10-CM

## 2011-12-26 DIAGNOSIS — C50219 Malignant neoplasm of upper-inner quadrant of unspecified female breast: Secondary | ICD-10-CM

## 2011-12-26 DIAGNOSIS — D0590 Unspecified type of carcinoma in situ of unspecified breast: Secondary | ICD-10-CM

## 2011-12-26 DIAGNOSIS — Z8049 Family history of malignant neoplasm of other genital organs: Secondary | ICD-10-CM

## 2011-12-26 NOTE — Progress Notes (Signed)
Dr. Welton Flakes requested a consultation for genetic counseling and risk assessment for Kayla Neal, a 51 y.o. female, for discussion of her personal history of breast cancer and family history of breast and ovarian cancer. She presents to clinic today to discuss the possibility of a genetic predisposition to cancer, and to further clarify her risks, as well as her family members' risks for cancer.   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 This was treated with surgery.    Past Medical History  Diagnosis Date  . Nasal congestion   . Wheezing   . Hypertension     under control without medication as of 11/11/11-no meds for  at least 1 yr  . Asthma     seasonal allergies related-prn albuterol  . GERD (gastroesophageal reflux disease)     no meds required in past 3 years  . Arthritis     knee pain-uses acetaminophen for pain control prn  . Cervical polyp   . 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    . Allergy     LISINOPRIL=N,V  . Anemia 10/18/2011    placed on iron tid for Hgb of 8  . Fibroids     MULTIPLE IN ENDOMETRIAL CAVITY  . Uterine bleeding, dysfunctional     severe  . Submucous myoma of uterus   . Use of megestrol acetate (Megace)     For uterine bleeding    Past Surgical History  Procedure Date  . Tooth extraction 2000  . Breast biopsy 11/14/2011    Procedure: BREAST BIOPSY WITH NEEDLE LOCALIZATION;  Surgeon: Currie Paris, MD;  Location: [facility];  Service: General;  Laterality: Right;  needle localization right breast calcifications  . Breast lumpectomy 11/14/11    RIGHT  BREAST=DUCTAL CA IN SITU,W/CALCIFICATIONS,HIGH GRADE,2.4CM,SURGICAL RESECTION MARGINS NEG,  DR. C. STRECK  . Endometrial biopsy 12/09/11    UTERUS=BENIGN ENDOMETRIAL GLANDS,BENIGN SQUAMOUS EPITHELIAL CELLS,NO DYSPLASIA OR MALIGNANCY  . Cervix polyp 10/17/11   NO MALIGNANCY    History  Substance Use Topics  . Smoking status: Never Smoker   . Smokeless tobacco: Never Used  . Alcohol Use: Yes     rare 1 or 2 times per month    REPRODUCTIVE HISTORY AND PERSONAL RISK ASSESSMENT FACTORS: Menarche was at age 25.   Premenopausal Uterus Intact: Yes Ovaries Intact: Yes [id number] , first live birth at age N/A  She has not previously undergone treatment for infertility.   OCP use for 30 years   She has not used HRT in the past.    FAMILY HISTORY:  We obtained a detailed, 4-generation family history.  Significant diagnoses are listed below: Family History  Problem Relation Age of Onset  . Diabetes Mother   . Hypertension Mother   . Heart disease Mother   . Colon cancer Mother 43  . Hypertension Father   . ALS Father 6  . Breast cancer Maternal Aunt     Age 47's  . Ovarian cancer Paternal Aunt 62  . Hypertension Brother   . Breast cancer Paternal Aunt     dx in her 24s  . Breast cancer Cousin     3 paternal cousins with breast cancer >50  The patient was diagnosed with breast cancer at age 74.  Her mother was diagnosed with colon cancer at age 66, and a maternal aunt  was diagnosed with breast cancer at age 65.  The patient has eight paternal aunts and one uncle.  One aunt had breast cancer in her 27s, and another had ovarian cancer at 26.  She has three paternal cousins with breast cancer diagnosed over the age of 26.  There is no other reported history of cancer.  Patient's maternal ancestors are of Caucasian, [place] American and Native American descent, and paternal ancestors are of African American descent. There is no reported Ashkenazi Jewish ancestry. There is known consanguinity, as patient's parents are 3rd cousins.  GENETIC COUNSELING RISK ASSESSMENT, DISCUSSION, AND SUGGESTED FOLLOW UP: We reviewed the natural history and genetic etiology of sporadic, familial and hereditary cancer syndromes.  About 5-10% of breast cancer is  hereditary.  Of this, about 85% is the result of a BRCA1 or BRCA2 mutation.  We reviewed the red flags of hereditary cancer syndromes and the dominant inheritance patterns.  If the BRCA testing is negative, we discussed that we could be testing for the wrong gene.  We discussed gene panels, and that several cancer genes that are associated with different cancers can be tested at the same time.  Because of the breast and ovarian cancer that are in the patient's family, we will consider the BRCAPlus panel test.   The patient's personal and family history is suggestive of the following possible diagnosis: hereditary cancer syndrome  We discussed that identification of a hereditary cancer syndrome may help her care providers tailor the patients medical management. If a mutation indicating a hereditary cancer syndrome is detected in this case, the [employer] recommendations would include increased cancer surveillance and possible prophylactic surger. If a mutation is detected, the patient will be referred back to the referring provider and to any additional appropriate care providers to discuss the relevant options.   If a mutation is not found in the patient, this will decrease the likelihood of hereditary cancer syndrome as the explanation for her breast cancer. Cancer surveillance options would be discussed for the patient according to the appropriate standard National Comprehensive Cancer Network and American Cancer Society guidelines, with consideration of their personal and family history risk factors. In this case, the patient will be referred back to their care providers for discussions of management.   In order to estimate her chance of having a BRCA1 or BRCA2 mutation, we used statistical models (Penn II) and laboratory data that take into account her personal medical history, family history and ancestry.  Because each model is different, there can be a lot of variability in  the risks they give.  Therefore, these numbers must be considered a rough range and not a precise risk of having a BRCA1 or BRCA2 mutation.  This model estimates that she has approximately a 14% chance of having a mutation. Based on this assessment of her family and personal history, genetic testing is recommended.  After considering the risks, benefits, and limitations, the patient provided informed consent for  the following  testing: BRCAPlus through [employer]   Per the patient's request, we will contact her by telephone to discuss these results. A follow up genetic counseling visit will be scheduled if indicated.  The patient was seen for a total of 60 minutes, greater than 50% of which was spent face-to-face counseling.  This plan is being carried out per Dr. Milta Deiters recommendations.  This note will also be sent to the referring provider via the electronic medical record. The patient will be supplied with  a summary of this genetic counseling discussion as well as educational information on the discussed hereditary cancer syndromes following the conclusion of their visit.   Patient was discussed with Dr. Drue Second.   _______________________________________________________________________ For Office Staff:  Number of people involved in session: 3 Was an Intern/ student involved with case: yes

## 2011-12-27 ENCOUNTER — Telehealth: Payer: Self-pay | Admitting: Obstetrics and Gynecology

## 2011-12-27 ENCOUNTER — Telehealth: Payer: Self-pay | Admitting: *Deleted

## 2011-12-27 NOTE — Telephone Encounter (Signed)
Tell patient she should continue Megace 40 mg daily.

## 2011-12-27 NOTE — Telephone Encounter (Signed)
Tell her that Dr. Nils Flack to not start radiation until after surgery.

## 2011-12-27 NOTE — Telephone Encounter (Signed)
Pt is calling to follow up, she asked have you spoke with Dr.Khan? Pt said this is regarding possible surgery. Please advise

## 2011-12-27 NOTE — Telephone Encounter (Signed)
Patient wants to check on dates for OCtober before I schedule surgery and she will let me know tomorrow what will work for her.  Patient said they told her BRACA1 and 2 test could take 3 weeks for results.  She wanted to know in light of surgery planned did you want her to go ahead and start her radiation?    Also she wants to know if she should continue Megace?  Bleeding has stopped.  I told her I will call her tomorrow with answers to these questions and she will have dates available then.

## 2011-12-27 NOTE — Telephone Encounter (Signed)
Dr. Welton Flakes never called me. Please call Dr. Milta Deiters office and leave her message to call me. Explain to patient.

## 2011-12-27 NOTE — Telephone Encounter (Signed)
Got MD on phone for Dr.G

## 2011-12-28 NOTE — Telephone Encounter (Signed)
Left message for patient to call me

## 2012-01-06 ENCOUNTER — Telehealth: Payer: Self-pay | Admitting: Genetic Counselor

## 2012-01-06 NOTE — Telephone Encounter (Signed)
Left good news message and asked to have her CB

## 2012-01-09 ENCOUNTER — Telehealth: Payer: Self-pay | Admitting: *Deleted

## 2012-01-09 ENCOUNTER — Encounter: Payer: Self-pay | Admitting: Genetic Counselor

## 2012-01-09 MED ORDER — MEGESTROL ACETATE 40 MG PO TABS: 40.0000 mg | ORAL_TABLET | Freq: Every day | ORAL | Status: DC

## 2012-01-09 NOTE — Telephone Encounter (Signed)
Pt calling requesting refill on megace 40 mg daily. Per telephone encounter 12/27/11 pt should continue megace daily. rx sent.

## 2012-01-10 ENCOUNTER — Other Ambulatory Visit: Payer: Self-pay | Admitting: Obstetrics and Gynecology

## 2012-01-12 ENCOUNTER — Encounter (HOSPITAL_COMMUNITY): Payer: Self-pay | Admitting: Pharmacist

## 2012-01-17 ENCOUNTER — Telehealth: Payer: Self-pay | Admitting: Radiation Oncology

## 2012-01-17 ENCOUNTER — Other Ambulatory Visit: Payer: Self-pay | Admitting: Radiation Oncology

## 2012-01-17 ENCOUNTER — Encounter: Payer: Self-pay | Admitting: Radiation Oncology

## 2012-01-17 DIAGNOSIS — C50911 Malignant neoplasm of unspecified site of right female breast: Secondary | ICD-10-CM

## 2012-01-17 NOTE — Progress Notes (Signed)
I was able to speak with the patient today by phone. She is undergoing hysterectomy in the near future and this is somewhat complicating timely adjuvant followup for her Right Breast DCIS.  Overall, the patient reports that she would like to receive her postoperative radiation in [place], [place] which is closer to home. I am going to make referral to Dr. Gillian Shields for this. She is somewhat overwhelmed and feels that she cannot attend this consultation until the week of October 28. She and I also spoke about radiology's recommendation at our breast tumor board for her to undergo a postoperative mammogram to rule out residual calcifications in her breast. I've ordered this for the week of October 21 so that it's available to Dr. Thersa Salt at his consultation.  She is pleased with this plan.  -----------------------------------  Lonie Peak, MD

## 2012-01-17 NOTE — Telephone Encounter (Signed)
Left a second message today (after not receiving call back last week) to reiterate to patient that I would like to speak with her about plans of care.  Asked her to call my office back today.   She will need a post-op mammogram  (per tumor board discussion ) and need to be scheduled for radiotherapy planning either here or in [place], depending on her choice.   -----------------------------------  Lonie Peak, MD

## 2012-01-18 ENCOUNTER — Encounter: Payer: Self-pay | Admitting: Obstetrics and Gynecology

## 2012-01-18 ENCOUNTER — Ambulatory Visit (INDEPENDENT_AMBULATORY_CARE_PROVIDER_SITE_OTHER): Payer: BC Managed Care – PPO | Admitting: Obstetrics and Gynecology

## 2012-01-18 ENCOUNTER — Telehealth: Payer: Self-pay | Admitting: *Deleted

## 2012-01-18 DIAGNOSIS — D259 Leiomyoma of uterus, unspecified: Secondary | ICD-10-CM

## 2012-01-18 DIAGNOSIS — D219 Benign neoplasm of connective and other soft tissue, unspecified: Secondary | ICD-10-CM

## 2012-01-18 DIAGNOSIS — N925 Other specified irregular menstruation: Secondary | ICD-10-CM

## 2012-01-18 DIAGNOSIS — N949 Unspecified condition associated with female genital organs and menstrual cycle: Secondary | ICD-10-CM

## 2012-01-18 DIAGNOSIS — N938 Other specified abnormal uterine and vaginal bleeding: Secondary | ICD-10-CM

## 2012-01-18 NOTE — Progress Notes (Signed)
Patient came back to see me today. Her BRCA1 and BRCA2 test were negative. Her bleeding is controlled on Megace 40 mg twice a day although she does have some breakthrough bleeding. We have scheduled her for surgery next week. Her oncologist is okay with her having her radiation for ductal carcinoma in situ of her breast after the surgery.  Exam: Kennon Portela present.Pelvic exam: External within normal limits. BUS within normal limits. Vaginal exam within normal limits. Cervix is clean without lesions. Uterus is 7-8 weeks size. Adnexa failed to reveal masses. Rectovaginal examination is confirmatory and without masses.  Although she doesn't have prolapse her uterus is relatively mobile and I think amenable to vaginal surgery.  We plan to do a vaginal hysterectomy. She does  know that we may need to convert it to abdominal hysterectomy. We  will use pulsatile stockings. We discussed normal postoperative recovery. We discussed risks of surgery including infection, hemorrhage, DVT, bowel or bladder injury, or fistula formation. All her questions were answered. She will stay on her Megace until surgery.

## 2012-01-18 NOTE — Patient Instructions (Addendum)
Stay on Megace to surgery. Do not take aspirin, ibuprofen, naproxen or vitamin E until surgery.

## 2012-01-18 NOTE — Telephone Encounter (Signed)
CALLED PATIENT TO INFORM OF APPT. WITH DR. PALERMO ON 02-13-12 - ARRIVAL TIME - 7:15 AM, SPOKE WITH PATIENT AND SHE WILL CALL ME BACK.

## 2012-01-23 ENCOUNTER — Encounter (HOSPITAL_COMMUNITY): Payer: Self-pay

## 2012-01-23 ENCOUNTER — Encounter (HOSPITAL_COMMUNITY)
Admission: RE | Admit: 2012-01-23 | Discharge: 2012-01-23 | Disposition: A | Discharge status: Home / Self Care | Payer: BC Managed Care – PPO | Source: Ambulatory Visit | Attending: Obstetrics and Gynecology | Admitting: Obstetrics and Gynecology

## 2012-01-23 LAB — BASIC METABOLIC PANEL
BUN: 10 mg/dL (ref 6–23)
Calcium: 9.7 mg/dL (ref 8.4–10.5)
GFR calc Af Amer: 86 mL/min — ABNORMAL LOW (ref >90)
GFR calc non Af Amer: 74 mL/min — ABNORMAL LOW (ref >90)
Glucose, Bld: 98 mg/dL (ref 70–99)
Potassium: 4.9 mEq/L (ref 3.5–5.1)
Sodium: 137 mEq/L (ref 135–145)

## 2012-01-23 LAB — CBC
HCT: 37.4 % (ref 36.0–46.0)
MCH: 24.1 pg — ABNORMAL LOW (ref 26.0–34.0)
MCHC: 30.7 g/dL (ref 30.0–36.0)
RDW: 15.3 % (ref 11.5–15.5)

## 2012-01-23 LAB — SURGICAL PCR SCREEN
MRSA, PCR: NEGATIVE
Staphylococcus aureus: NEGATIVE

## 2012-01-23 NOTE — Patient Instructions (Addendum)
   Your procedure is scheduled on: Thursday, Oct 10  Enter through the [employer] of [facility] at: 6 am Pick up the phone at the desk and dial [phone] and inform us of your arrival.  Please call this number if you have any problems the morning of surgery: [phone]  Remember: Do not eat food after midnight: Wednesday Do not drink clear liquids after: Wednesday Take these medicines the morning of surgery with a SIP OF WATER: Diovan.   Patient to bring albuterol inhaler with her on DOS.  Do not wear jewelry, make-up, or FINGER nail polish No metal in your hair or on your body. Do not wear lotions, powders, perfumes. You may wear deodorant.  Please use your CHG wash as directed prior to surgery.  Do not shave anywhere for at least 12 hours prior to first CHG shower.  Do not bring valuables to the hospital. Contacts, dentures or bridgework may not be worn into surgery.  Leave suitcase in the car. After Surgery it may be brought to your room. For patients being admitted to the hospital, checkout time is 11:00am the day of discharge. Home with sister Enrique Sack.  Patients discharged on the day of surgery will not be allowed to drive home.

## 2012-01-25 MED ORDER — DEXTROSE 5 % IV SOLN
3.0000 g | INTRAVENOUS | Status: AC
  Administered 2012-01-26: 3 g via INTRAVENOUS
  Filled 2012-01-25: qty 3000

## 2012-01-25 NOTE — H&P (Addendum)
  Chief complaint: menomettorhagia with multiple fibroids and anemia.       HPI: Patient is a 51 year old nulligravida with a one year history of menorrhagia and metorrhagia and dysmenorrhea. She has been anemic with the above with a hemoglobin as low as 7.0. She has been treated with several different birth control pills and a progestin without relief of the above. On examination the patient had fibroids of her uterus and an endocervical polyp which was removed. In spite of this the problem has persisted. Saline infusion hystogram revealed multiple fibroids including a 4 cm submucous fibroid which invades significantly into the myometrium. Endometrial biopsy was benign. The patient now enters for vaginal hysterectomy for correction of the above. She has been recently diagnosed with ductal carcinoma in situ of the breast but her oncologist would like the gyn surgery done before she starts treatment for her breast cancer. She was tested for BRCA-1 and BRCA-2 and was negative and therefore her ovarys will not be removed. Her anemia has partially responded to iron but she is still anemic. She understands risks of surgery including need for laparotomy if surgery can not be done vaginally.                          Past medical history, family history, social history and review of systems in epic chart and reviewed.                          Physical examination: HEENT within normal limits. Neck: Thyroid not large. No masses. Supraclavicular nodes: not enlarged. Breasts: Examined in both sitting and lying  position. No skin changes and no masses. Abdomen: Soft no guarding rebound or masses or hernia. Pelvic: External: Within normal limits. BUS: Within normal limits. Vaginal:within normal limits. Good estrogen effect. No evidence of cystocele rectocele or enterocele. Cervix: clean. Uterus: enlarged by multiple fibroids to 10 weeks size. Adnexa: no masses. Rectovaginal exam: confirmatory with no rectal masses.  Extremities: within normal limits.  . Assessment: Fibroids with dysmenorrhea, menorragia, and anemia. Breast Cancer                         Plan: Vaginal hysterectomy                    Date of Initial H&P: 01/25/2012  History reviewed, patient examined, no change in status, stable for surgery.

## 2012-01-26 ENCOUNTER — Encounter (HOSPITAL_COMMUNITY): Payer: Self-pay

## 2012-01-26 ENCOUNTER — Encounter (HOSPITAL_COMMUNITY): Payer: Self-pay | Admitting: Anesthesiology

## 2012-01-26 ENCOUNTER — Encounter (HOSPITAL_COMMUNITY)
Admission: RE | Disposition: A | Discharge status: Home / Self Care | Payer: Self-pay | Source: Ambulatory Visit | Attending: Obstetrics and Gynecology

## 2012-01-26 ENCOUNTER — Ambulatory Visit (HOSPITAL_COMMUNITY): Payer: BC Managed Care – PPO | Admitting: Anesthesiology

## 2012-01-26 ENCOUNTER — Observation Stay (HOSPITAL_COMMUNITY)
Admission: RE | Admit: 2012-01-26 | Discharge: 2012-01-27 | Disposition: A | Discharge status: Home / Self Care | Payer: BC Managed Care – PPO | Source: Ambulatory Visit | Attending: Obstetrics and Gynecology | Admitting: Obstetrics and Gynecology

## 2012-01-26 DIAGNOSIS — D250 Submucous leiomyoma of uterus: Secondary | ICD-10-CM | POA: Insufficient documentation

## 2012-01-26 DIAGNOSIS — C50919 Malignant neoplasm of unspecified site of unspecified female breast: Secondary | ICD-10-CM | POA: Insufficient documentation

## 2012-01-26 DIAGNOSIS — N949 Unspecified condition associated with female genital organs and menstrual cycle: Secondary | ICD-10-CM

## 2012-01-26 DIAGNOSIS — D259 Leiomyoma of uterus, unspecified: Secondary | ICD-10-CM

## 2012-01-26 DIAGNOSIS — Z01818 Encounter for other preprocedural examination: Secondary | ICD-10-CM | POA: Insufficient documentation

## 2012-01-26 DIAGNOSIS — D500 Iron deficiency anemia secondary to blood loss (chronic): Secondary | ICD-10-CM | POA: Insufficient documentation

## 2012-01-26 DIAGNOSIS — D649 Anemia, unspecified: Secondary | ICD-10-CM

## 2012-01-26 DIAGNOSIS — C50911 Malignant neoplasm of unspecified site of right female breast: Secondary | ICD-10-CM

## 2012-01-26 DIAGNOSIS — N920 Excessive and frequent menstruation with regular cycle: Principal | ICD-10-CM | POA: Insufficient documentation

## 2012-01-26 DIAGNOSIS — N946 Dysmenorrhea, unspecified: Secondary | ICD-10-CM | POA: Insufficient documentation

## 2012-01-26 DIAGNOSIS — Z01812 Encounter for preprocedural laboratory examination: Secondary | ICD-10-CM | POA: Insufficient documentation

## 2012-01-26 DIAGNOSIS — N841 Polyp of cervix uteri: Secondary | ICD-10-CM | POA: Insufficient documentation

## 2012-01-26 HISTORY — PX: VAGINAL HYSTERECTOMY: SHX2639

## 2012-01-26 SURGERY — HYSTERECTOMY, VAGINAL
Anesthesia: General | Site: Vagina | Wound class: Clean Contaminated

## 2012-01-26 MED ORDER — DEXAMETHASONE SODIUM PHOSPHATE 4 MG/ML IJ SOLN
INTRAMUSCULAR | Status: DC | PRN
  Administered 2012-01-26: 10 mg via INTRAVENOUS

## 2012-01-26 MED ORDER — GLYCOPYRROLATE 0.2 MG/ML IJ SOLN
INTRAMUSCULAR | Status: DC | PRN
  Administered 2012-01-26: 0.4 mg via INTRAVENOUS

## 2012-01-26 MED ORDER — KETOROLAC TROMETHAMINE 30 MG/ML IJ SOLN
INTRAMUSCULAR | Status: AC
  Filled 2012-01-26: qty 1

## 2012-01-26 MED ORDER — LIDOCAINE-EPINEPHRINE 0.5 %-1:200000 IJ SOLN
INTRAMUSCULAR | Status: DC | PRN
  Administered 2012-01-26: 10 mL

## 2012-01-26 MED ORDER — KETOROLAC TROMETHAMINE 30 MG/ML IJ SOLN
30.0000 mg | Freq: Once | INTRAMUSCULAR | Status: AC
  Administered 2012-01-26: 30 mg via INTRAVENOUS

## 2012-01-26 MED ORDER — MIDAZOLAM HCL 2 MG/2ML IJ SOLN
INTRAMUSCULAR | Status: AC
  Filled 2012-01-26: qty 2

## 2012-01-26 MED ORDER — ROCURONIUM BROMIDE 50 MG/5ML IV SOLN
INTRAVENOUS | Status: AC
  Filled 2012-01-26: qty 1

## 2012-01-26 MED ORDER — LACTATED RINGERS IV SOLN
INTRAVENOUS | Status: DC | PRN
  Administered 2012-01-26 (×2): via INTRAVENOUS

## 2012-01-26 MED ORDER — DEXTROSE-NACL 5-0.45 % IV SOLN
INTRAVENOUS | Status: DC
  Administered 2012-01-26 – 2012-01-27 (×3): via INTRAVENOUS

## 2012-01-26 MED ORDER — LIDOCAINE-EPINEPHRINE 0.5 %-1:200000 IJ SOLN
INTRAMUSCULAR | Status: AC
  Filled 2012-01-26: qty 1

## 2012-01-26 MED ORDER — LIDOCAINE HCL (CARDIAC) 20 MG/ML IV SOLN
INTRAVENOUS | Status: DC | PRN
  Administered 2012-01-26: 30 mg via INTRAVENOUS
  Administered 2012-01-26: 20 mg via INTRAVENOUS

## 2012-01-26 MED ORDER — ONDANSETRON HCL 4 MG PO TABS: 4.0000 mg | ORAL_TABLET | Freq: Four times a day (QID) | ORAL | Status: DC | PRN

## 2012-01-26 MED ORDER — ALBUTEROL SULFATE HFA 108 (90 BASE) MCG/ACT IN AERS
1.0000 | INHALATION_SPRAY | Freq: Four times a day (QID) | RESPIRATORY_TRACT | Status: DC | PRN
  Filled 2012-01-26: qty 6.7

## 2012-01-26 MED ORDER — HYDROMORPHONE HCL PF 1 MG/ML IJ SOLN
0.2500 mg | INTRAMUSCULAR | Status: DC | PRN
  Administered 2012-01-26: 0.5 mg via INTRAVENOUS

## 2012-01-26 MED ORDER — MENTHOL 3 MG MT LOZG: 1.0000 | LOZENGE | OROMUCOSAL | Status: DC | PRN

## 2012-01-26 MED ORDER — ROCURONIUM BROMIDE 100 MG/10ML IV SOLN
INTRAVENOUS | Status: DC | PRN
  Administered 2012-01-26: 40 mg via INTRAVENOUS
  Administered 2012-01-26: 10 mg via INTRAVENOUS

## 2012-01-26 MED ORDER — ONDANSETRON HCL 4 MG/2ML IJ SOLN
INTRAMUSCULAR | Status: DC | PRN
  Administered 2012-01-26: 4 mg via INTRAVENOUS

## 2012-01-26 MED ORDER — FENTANYL CITRATE 0.05 MG/ML IJ SOLN
INTRAMUSCULAR | Status: AC
  Filled 2012-01-26: qty 5

## 2012-01-26 MED ORDER — MIDAZOLAM HCL 5 MG/5ML IJ SOLN
INTRAMUSCULAR | Status: DC | PRN
  Administered 2012-01-26: 2 mg via INTRAVENOUS

## 2012-01-26 MED ORDER — ONDANSETRON HCL 4 MG/2ML IJ SOLN: 4.0000 mg | Freq: Four times a day (QID) | INTRAMUSCULAR | Status: DC | PRN

## 2012-01-26 MED ORDER — LACTATED RINGERS IV SOLN
INTRAVENOUS | Status: DC
  Administered 2012-01-26: 07:00:00 via INTRAVENOUS

## 2012-01-26 MED ORDER — PROPOFOL 10 MG/ML IV EMUL
INTRAVENOUS | Status: AC
  Filled 2012-01-26: qty 20

## 2012-01-26 MED ORDER — FENTANYL CITRATE 0.05 MG/ML IJ SOLN
INTRAMUSCULAR | Status: AC
Start: 2012-01-26 — End: 2012-01-26
  Filled 2012-01-26: qty 5

## 2012-01-26 MED ORDER — NEOSTIGMINE METHYLSULFATE 1 MG/ML IJ SOLN
INTRAMUSCULAR | Status: AC
  Filled 2012-01-26: qty 10

## 2012-01-26 MED ORDER — PROPOFOL 10 MG/ML IV EMUL
INTRAVENOUS | Status: DC | PRN
  Administered 2012-01-26: 200 mg via INTRAVENOUS

## 2012-01-26 MED ORDER — LIDOCAINE HCL (CARDIAC) 20 MG/ML IV SOLN
INTRAVENOUS | Status: AC
  Filled 2012-01-26: qty 5

## 2012-01-26 MED ORDER — NEOSTIGMINE METHYLSULFATE 1 MG/ML IJ SOLN
INTRAMUSCULAR | Status: DC | PRN
  Administered 2012-01-26: 3 mg via INTRAVENOUS

## 2012-01-26 MED ORDER — GLYCOPYRROLATE 0.2 MG/ML IJ SOLN
INTRAMUSCULAR | Status: AC
  Filled 2012-01-26: qty 1

## 2012-01-26 MED ORDER — HYDROMORPHONE HCL PF 1 MG/ML IJ SOLN
INTRAMUSCULAR | Status: AC
  Administered 2012-01-26: 0.5 mg via INTRAVENOUS
  Filled 2012-01-26: qty 1

## 2012-01-26 MED ORDER — HYDROCODONE-ACETAMINOPHEN 5-325 MG PO TABS
2.0000 | ORAL_TABLET | ORAL | Status: DC | PRN
  Administered 2012-01-26 – 2012-01-27 (×3): 2 via ORAL
  Filled 2012-01-26 (×3): qty 2

## 2012-01-26 MED ORDER — GLYCOPYRROLATE 0.2 MG/ML IJ SOLN
INTRAMUSCULAR | Status: AC
Start: 2012-01-26 — End: 2012-01-26
  Filled 2012-01-26: qty 1

## 2012-01-26 MED ORDER — FENTANYL CITRATE 0.05 MG/ML IJ SOLN
INTRAMUSCULAR | Status: AC
  Filled 2012-01-26: qty 2

## 2012-01-26 MED ORDER — DEXAMETHASONE SODIUM PHOSPHATE 10 MG/ML IJ SOLN
INTRAMUSCULAR | Status: AC
  Filled 2012-01-26: qty 1

## 2012-01-26 MED ORDER — TEMAZEPAM 15 MG PO CAPS: 15.0000 mg | ORAL_CAPSULE | Freq: Every evening | ORAL | Status: DC | PRN

## 2012-01-26 MED ORDER — FENTANYL CITRATE 0.05 MG/ML IJ SOLN
INTRAMUSCULAR | Status: DC | PRN
  Administered 2012-01-26 (×2): 50 ug via INTRAVENOUS
  Administered 2012-01-26: 25 ug via INTRAVENOUS
  Administered 2012-01-26: 50 ug via INTRAVENOUS
  Administered 2012-01-26: 100 ug via INTRAVENOUS
  Administered 2012-01-26: 50 ug via INTRAVENOUS
  Administered 2012-01-26: 100 ug via INTRAVENOUS
  Administered 2012-01-26: 50 ug via INTRAVENOUS
  Administered 2012-01-26: 25 ug via INTRAVENOUS

## 2012-01-26 MED ORDER — ONDANSETRON HCL 4 MG/2ML IJ SOLN
INTRAMUSCULAR | Status: AC
  Filled 2012-01-26: qty 2

## 2012-01-26 SURGICAL SUPPLY — 41 items
CANISTER SUCTION 2500CC (MISCELLANEOUS) ×2 IMPLANT
CATH ROBINSON RED A/P 16FR (CATHETERS) ×1 IMPLANT
CLOTH BEACON ORANGE TIMEOUT ST (SAFETY) ×2 IMPLANT
CONT PATH 16OZ SNAP LID 3702 (MISCELLANEOUS) IMPLANT
DECANTER SPIKE VIAL GLASS SM (MISCELLANEOUS) ×1 IMPLANT
DRESSING TELFA 8X3 (GAUZE/BANDAGES/DRESSINGS) ×2 IMPLANT
GLOVE BIO SURGEON STRL SZ7 (GLOVE) ×2 IMPLANT
GLOVE BIOGEL PI IND STRL 6.5 (GLOVE) ×1 IMPLANT
GLOVE BIOGEL PI IND STRL 7.0 (GLOVE) IMPLANT
GLOVE BIOGEL PI IND STRL 7.5 (GLOVE) ×2 IMPLANT
GLOVE BIOGEL PI INDICATOR 6.5 (GLOVE) ×1
GLOVE BIOGEL PI INDICATOR 7.0 (GLOVE) ×3
GLOVE BIOGEL PI INDICATOR 7.5 (GLOVE) ×2
GLOVE ECLIPSE 7.0 STRL STRAW (GLOVE) ×3 IMPLANT
GOWN PREVENTION PLUS LG XLONG (DISPOSABLE) ×6 IMPLANT
GOWN STRL REIN 2XL XLG LVL4 (GOWN DISPOSABLE) ×2 IMPLANT
GOWN STRL REIN XL XLG (GOWN DISPOSABLE) ×4 IMPLANT
NDL SPNL 18GX3.5 QUINCKE PK (NEEDLE) ×1 IMPLANT
NDL SPNL 22GX3.5 QUINCKE BK (NEEDLE) IMPLANT
NEEDLE HYPO 22GX1.5 SAFETY (NEEDLE) IMPLANT
NEEDLE MAYO .5 CIRCLE (NEEDLE) IMPLANT
NEEDLE SPNL 18GX3.5 QUINCKE PK (NEEDLE) ×2 IMPLANT
NEEDLE SPNL 22GX3.5 QUINCKE BK (NEEDLE) IMPLANT
NS IRRIG 1000ML POUR BTL (IV SOLUTION) ×3 IMPLANT
PACK VAGINAL WOMENS (CUSTOM PROCEDURE TRAY) ×2 IMPLANT
PAD OB MATERNITY 4.3X12.25 (PERSONAL CARE ITEMS) ×3 IMPLANT
SEALER TISSUE X1 CVD JAW (INSTRUMENTS) ×1 IMPLANT
SPONGE LAP 4X18 X RAY DECT (DISPOSABLE) IMPLANT
SUT CHROMIC 1 TIES 18 (SUTURE) ×2 IMPLANT
SUT CHROMIC 1MO 4 18 CR8 (SUTURE) ×6 IMPLANT
SUT VIC AB 0 CT1 27 (SUTURE) ×6
SUT VIC AB 0 CT1 27XBRD ANBCTR (SUTURE) ×2 IMPLANT
SUT VIC AB 0 CT1 27XBRD ANTBC (SUTURE) IMPLANT
SUT VIC AB 2-0 SH 27 (SUTURE) ×2
SUT VIC AB 2-0 SH 27XBRD (SUTURE) ×1 IMPLANT
SYR 20CC LL (SYRINGE) ×2 IMPLANT
TOWEL OR 17X24 6PK STRL BLUE (TOWEL DISPOSABLE) ×4 IMPLANT
TRAY FOLEY CATH 14FR (SET/KITS/TRAYS/PACK) ×2 IMPLANT
TUBING SUCTION BULK 100 FT (MISCELLANEOUS) ×1 IMPLANT
WATER STERILE IRR 1000ML POUR (IV SOLUTION) ×2 IMPLANT
YANKAUER SUCT BULB TIP NO VENT (SUCTIONS) ×1 IMPLANT

## 2012-01-26 NOTE — Addendum Note (Signed)
Addendum  created 01/26/12 1808 by Graciela Husbands, CRNA   Modules edited:Notes Section

## 2012-01-26 NOTE — Anesthesia Preprocedure Evaluation (Addendum)
Anesthesia Evaluation  Patient identified by MRN, date of birth, ID band Patient awake    Reviewed: Allergy & Precautions, H&P , NPO status , Patient's Chart, lab work & pertinent test results  Airway Mallampati: II TM Distance: >3 FB Neck ROM: Full    Dental  (+) Dental Advisory Given,    Pulmonary asthma ,    Pulmonary exam normal       Cardiovascular hypertension,     Neuro/Psych    GI/Hepatic GERD-  ,  Endo/Other  Morbid obesity  Renal/GU      Musculoskeletal   Abdominal (+) + obese,   Peds  Hematology   Anesthesia Other Findings   Reproductive/Obstetrics                          Anesthesia Physical  Anesthesia Plan  ASA: III  Anesthesia Plan: General   Post-op Pain Management:    Induction: Intravenous  Airway Management Planned: Oral ETT  Additional Equipment:   Intra-op Plan:   Post-operative Plan: Extubation in OR  Informed Consent: I have reviewed the patients History and Physical, chart, labs and discussed the procedure including the risks, benefits and alternatives for the proposed anesthesia with the patient or authorized representative who has indicated his/her understanding and acceptance.   Dental Advisory Given and Dental advisory given  Plan Discussed with: CRNA and Surgeon  Anesthesia Plan Comments: (  Discussed  general anesthesia, including possible nausea, instrumentation of airway, sore throat,pulmonary aspiration, etc. I asked if the were any outstanding questions, or  concerns before we proceeded. )        Anesthesia Quick Evaluation

## 2012-01-26 NOTE — Anesthesia Postprocedure Evaluation (Signed)
  Anesthesia Post-op Note  Patient: Kayla Neal  Procedure(s) Performed: Procedure(s) (LRB) with comments: HYSTERECTOMY VAGINAL (N/A)  Patient Location: [facility]  Anesthesia Type: General  Level of Consciousness: awake, alert  and oriented  Airway and Oxygen Therapy: Patient Spontanous Breathing and Patient connected to nasal cannula oxygen  Post-op Pain: mild  Post-op Assessment: Post-op Vital signs reviewed, Patient's Cardiovascular Status Stable and Pain level controlled  Post-op Vital Signs: Reviewed and stable  Complications: No apparent anesthesia complications

## 2012-01-26 NOTE — Transfer of Care (Signed)
Immediate Anesthesia Transfer of Care Note  Patient: Kayla Neal  Procedure(s) Performed: Procedure(s) (LRB) with comments: HYSTERECTOMY VAGINAL (N/A)  Patient Location: PACU  Anesthesia Type: General  Level of Consciousness: awake, oriented and sedated  Airway & Oxygen Therapy: Patient Spontanous Breathing and Patient connected to nasal cannula oxygen  Post-op Assessment: Report given to PACU RN and Post -op Vital signs reviewed and stable  Post vital signs: Reviewed and stable  Complications: No apparent anesthesia complications

## 2012-01-26 NOTE — Anesthesia Postprocedure Evaluation (Signed)
Anesthesia Post Note  Patient: Kayla Neal  Procedure(s) Performed: Procedure(s) (LRB): HYSTERECTOMY VAGINAL (N/A)  Anesthesia type: General  Patient location: PACU  Post pain: Pain level controlled  Post assessment: Post-op Vital signs reviewed  Last Vitals:  Filed Vitals:   01/26/12 0619  BP: 158/78  Pulse: 76  Temp: 36.8 C  Resp: 18    Post vital signs: Reviewed  Level of consciousness: sedated  Complications: No apparent anesthesia complicationsfj

## 2012-01-26 NOTE — Transfer of Care (Signed)
Immediate Anesthesia Transfer of Care Note  Patient: Kayla Neal  Procedure(s) Performed: Procedure(s) (LRB) with comments: HYSTERECTOMY VAGINAL (N/A)  Patient Location: PACU  Anesthesia Type: General  Level of Consciousness: awake, sedated and patient cooperative  Airway & Oxygen Therapy: Patient Spontanous Breathing and Patient connected to nasal cannula oxygen  Post-op Assessment: Report given to PACU RN and Post -op Vital signs reviewed and stable  Post vital signs: Reviewed and stable  Complications: No apparent anesthesia complications

## 2012-01-26 NOTE — Op Note (Signed)
Preoperative diagnoses: Fibroids including submucous fibroid with menometrorrhagia and anemia. Postoperative diagnosis: Same Operation: Vaginal hysterectomy Surgeon: Dr. Eda Paschal First Asst.: Dr. Audie Box Anesthesia: Gen. Endotracheal  Findings: The uterus was enlarged by multiple myoma to 10-to 12 week size. There was a submucosal myoma that filled the entire endometrial cavity and inflated the myometrium of 4-5 cm. There was a subserosal myoma of approximately 5 cm. The total weight of the uterus was 335 g. The ovaries and fallopian tubes were difficult to visualize but appeared normal. They were normal on ultrasound. She is BRCA1 and BRCA2 negative. The peritoneal cavity was free of endometriosis or adhesions.  Procedure: After general endotracheal anesthesia the patient was placed in the dorsolithotomy position and prepped and draped in the usual sterile manner. She was given 3 g of Ancef IV. She had on pulsatile stockings. A half percent Xylocaine solution with 1-200,000 epinephrine was injected 360 around the cervix. A Foley catheter was inserted into the patient's bladder. A 360 incision was made around the cervix. The bladder and posterior peritoneum were mobilized with sharp dissection. The cul-de-sac was entered atraumatically. The uterosacral ligaments were clamped, cut, and sutured with #1 chromic catgut. As the procedure continued the bladder was advanced off the uterus with sharp dissection. The cardinal ligaments and uterine arteries were clamped with a giant jaws endseal  And cauterized and separated. At this point the uterus was morcellated and one by one fibroids were removed with sharp dissection until the uterus finally was smaller and could be removed. The rest of the broad ligament, utero-ovarian ligament, and fallopian tube were separated from the uterus with the Enseal. The uterus weighed 335 g. Ovaries appeared normal but were difficult to visualize completely. The vaginal cuff was  sutured to the posterior peritoneum with 2 running locking sutures of 0 Vicryl. Copious irrigation was done with sterile saline. The posterior peritoneum and the vaginal cuff were brought together with figure of eights of #1 chromic catgut. The uterosacral ligaments had previously been sutured to the vault  Laterally for good vaginal vault support. Once again copious irrigation was done with sterile saline. At the termination of the procedure the Foley catheter was draining clear urine. Estimated blood loss was 100-200 cc. No blood was given. The specimen was sent to pathology for tissue diagnosis. The patient tolerated procedure well and left the operating room in satisfactory condition.

## 2012-01-27 ENCOUNTER — Encounter (HOSPITAL_COMMUNITY): Payer: Self-pay | Admitting: Obstetrics and Gynecology

## 2012-01-27 MED ORDER — HYDROCODONE-ACETAMINOPHEN 5-325 MG PO TABS: 2.0000 | ORAL_TABLET | ORAL | Status: DC | PRN

## 2012-01-27 NOTE — Progress Notes (Signed)
Post-operative day 1-Afebrile. Abdomen is soft. Tolerating liquids. Passing flatus. Urine clear. Output-excellent. Will discharge.

## 2012-01-27 NOTE — Discharge Summary (Signed)
  Date of admission: 01/26/2012 Date of discharge: 01/27/2012 Final diagnosis: Multiple fibroids including submucosal fibroid. Menorrhagia. Anemia. Breast cancer. Operation: Vaginal hysterectomy.  Patient has been suffering with severe anemia associated with severe menorrhagia with fibroids. Saline infusion histogram showed a submucous myoma that invade the myometrium. She entered the hospital for vaginal hysterectomy. On the day of admission a vaginal hysterectomy was done. Multiple myomectomies and morcellation was needed to remove the uterus. Postoperatively the patient did well without any complications and was discharged on the first postoperative day. She will be seen in the office in 4 weeks.   Condition on discharge: Improved Diet on discharge: Soft diet and she will advance. Medication on discharge: Iron. Vicodin when necessary. Laboratory results: In epic record. Only preoperative abnormal was hemoglobin of 11.1. No postoperative lab done. Final pathology report: Not available at time of dictation.

## 2012-01-27 NOTE — Progress Notes (Signed)
UR Chart review completed.  

## 2012-01-27 NOTE — Progress Notes (Signed)
Instructions provided to patient and husband at bedside. Prescriptions provided to patient by MD.  No questions at this time.  Patient left unit in personal belongings in stable condition.  Osvaldo Angst, RN--------------

## 2012-02-02 ENCOUNTER — Telehealth: Payer: Self-pay | Admitting: *Deleted

## 2012-02-02 NOTE — Telephone Encounter (Signed)
Call from pt requesting to r/s 02/10/12 appt-Recently had hysterectomy, and pt does not have a ride. Onc Tx schedule sent

## 2012-02-02 NOTE — Telephone Encounter (Signed)
Per orders from 02-02-2012 left voice message to inform the patient of the new date and time on 02-16-2012 starting at 1:00pm

## 2012-02-07 ENCOUNTER — Encounter: Payer: Self-pay | Admitting: Obstetrics and Gynecology

## 2012-02-07 ENCOUNTER — Telehealth: Payer: Self-pay | Admitting: *Deleted

## 2012-02-07 NOTE — Telephone Encounter (Signed)
It can last 3-4 weeks. She only needs to come in for fever, heavy bleeding or pain.

## 2012-02-07 NOTE — Telephone Encounter (Signed)
Pt had Vaginal hysterectomy on 01/26/12, pt said she is still having some clear, brownish with very light pink discharge at times, no bleeding. Pt said you told her this would happen, but though it would have stopped by now. No pain nor other complaints. Please advise

## 2012-02-07 NOTE — Telephone Encounter (Signed)
Pt informed with the below note. 

## 2012-02-08 ENCOUNTER — Encounter: Payer: Self-pay | Admitting: Obstetrics and Gynecology

## 2012-02-10 ENCOUNTER — Ambulatory Visit: Payer: BC Managed Care – PPO | Admitting: Oncology

## 2012-02-10 ENCOUNTER — Other Ambulatory Visit: Payer: BC Managed Care – PPO | Admitting: Lab

## 2012-02-16 ENCOUNTER — Ambulatory Visit (HOSPITAL_BASED_OUTPATIENT_CLINIC_OR_DEPARTMENT_OTHER): Payer: BC Managed Care – PPO | Admitting: Oncology

## 2012-02-16 ENCOUNTER — Encounter: Payer: Self-pay | Admitting: Oncology

## 2012-02-16 ENCOUNTER — Other Ambulatory Visit (HOSPITAL_BASED_OUTPATIENT_CLINIC_OR_DEPARTMENT_OTHER): Payer: BC Managed Care – PPO

## 2012-02-16 ENCOUNTER — Telehealth: Payer: Self-pay | Admitting: *Deleted

## 2012-02-16 VITALS — BP 142/86 | HR 73 | Temp 98.4°F | Resp 20 | Ht 69.0 in | Wt 320.4 lb

## 2012-02-16 DIAGNOSIS — D0590 Unspecified type of carcinoma in situ of unspecified breast: Secondary | ICD-10-CM

## 2012-02-16 DIAGNOSIS — Z170 Estrogen receptor positive status [ER+]: Secondary | ICD-10-CM

## 2012-02-16 DIAGNOSIS — C50919 Malignant neoplasm of unspecified site of unspecified female breast: Secondary | ICD-10-CM

## 2012-02-16 DIAGNOSIS — C50911 Malignant neoplasm of unspecified site of right female breast: Secondary | ICD-10-CM

## 2012-02-16 DIAGNOSIS — D649 Anemia, unspecified: Secondary | ICD-10-CM

## 2012-02-16 DIAGNOSIS — D0510 Intraductal carcinoma in situ of unspecified breast: Secondary | ICD-10-CM

## 2012-02-16 LAB — CBC WITH DIFFERENTIAL/PLATELET
BASO%: 0.4 % (ref 0.0–2.0)
EOS%: 1.5 % (ref 0.0–7.0)
HCT: 35 % (ref 34.8–46.6)
LYMPH%: 30.5 % (ref 14.0–49.7)
MCH: 24.5 pg — ABNORMAL LOW (ref 25.1–34.0)
MCHC: 32 g/dL (ref 31.5–36.0)
MCV: 76.5 fL — ABNORMAL LOW (ref 79.5–101.0)
MONO#: 0.3 10*3/uL (ref 0.1–0.9)
NEUT%: 62.7 % (ref 38.4–76.8)
Platelets: 218 10*3/uL (ref 145–400)

## 2012-02-16 LAB — COMPREHENSIVE METABOLIC PANEL (CC13)
ALT: 12 U/L (ref 0–55)
CO2: 28 mEq/L (ref 22–29)
Creatinine: 0.8 mg/dL (ref 0.6–1.1)
Total Bilirubin: 0.41 mg/dL (ref 0.20–1.20)

## 2012-02-16 NOTE — Patient Instructions (Addendum)
Proceed with radiation   I will see you back in January 2014 

## 2012-02-16 NOTE — Telephone Encounter (Signed)
Gave patient appointment for 04-27-2012 starting at 1:30pm

## 2012-02-17 DIAGNOSIS — Z923 Personal history of irradiation: Secondary | ICD-10-CM

## 2012-02-17 HISTORY — DX: Personal history of irradiation: Z92.3

## 2012-02-23 NOTE — Progress Notes (Signed)
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 female returns for follow up visit. Overall she is doing well. She denies any nausea or vomiting. No fevers or chills. No headaches. She does have a little tenderness at the surgical site.  MEDICAL HISTORY: Past Medical History  Diagnosis Date  . Nasal congestion   . Wheezing     hx  - at allergy season only  . Hypertension     under control without medication as of 11/11/11-no meds for  at least 1 yr  . Asthma     seasonal allergies related-prn albuterol  . GERD (gastroesophageal reflux disease)     no meds required in past 3 years  . Arthritis     knee pain-uses acetaminophen for pain control prn  . Cervical polyp   . 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    . Allergy     LISINOPRIL=N,V  . Anemia 10/18/2011    placed on iron tid for Hgb of 8  . Fibroids     MULTIPLE IN ENDOMETRIAL CAVITY  . Uterine bleeding, dysfunctional     severe  . Submucous myoma of uterus   . Use of megestrol acetate (Megace)     For uterine bleeding    ALLERGIES:  is allergic to lisinopril; bee pollen; and oxycodone.  MEDICATIONS:  Current Outpatient Prescriptions  Medication Sig Dispense Refill  . Acetaminophen 650 MG TABS Take 1-2 tablets by mouth daily as needed. For mild pain      . albuterol (PROVENTIL HFA;VENTOLIN HFA) 108 (90 BASE) MCG/ACT inhaler Inhale 1 puff into the lungs daily as needed. For asthma symptoms      . EVENING PRIMROSE OIL PO Take by mouth daily.       . Ferrous Sulfate (IRON) 325 (65 FE) MG TABS Take 1 tablet by mouth 3 (three) times daily.       . Multiple Vitamins-Minerals (ONE-A-DAY  MENOPAUSE FORMULA PO) Take 1 tablet by mouth daily.       . valsartan (DIOVAN) 160 MG tablet Take 160 mg by mouth daily.        SURGICAL HISTORY:  Past Surgical History  Procedure Date  . Tooth extraction 2000  . Breast biopsy 11/14/2011    Procedure: BREAST BIOPSY WITH NEEDLE LOCALIZATION;  Surgeon: Currie Paris, MD;  Location: [facility];  Service: General;  Laterality: Right;  needle localization right breast calcifications  . Breast lumpectomy 11/14/11    RIGHT  BREAST=DUCTAL CA IN SITU,W/CALCIFICATIONS,HIGH GRADE,2.4CM,SURGICAL RESECTION MARGINS NEG,  DR. C. STRECK  . Endometrial biopsy 12/09/11    UTERUS=BENIGN ENDOMETRIAL GLANDS,BENIGN SQUAMOUS EPITHELIAL CELLS,NO DYSPLASIA OR MALIGNANCY  . Cervix polyp 10/17/11    NO MALIGNANCY  . Tonsillectomy   . Vaginal hysterectomy 01/26/2012    Procedure: HYSTERECTOMY VAGINAL;  Surgeon: [name], MD;  Location: [facility];  Service: Gynecology;  Laterality: N/A;    REVIEW OF SYSTEMS:  Pertinent items are noted in HPI.   HEALTH MAINTENANCE:   PHYSICAL EXAMINATION: Blood pressure 142/86, pulse 73, temperature 98.4 F (36.9 C), temperature source Oral, resp. rate 20, height 5\' 9"  (1.753 m), weight 320 lb 6.4 oz (  145.332 kg), last menstrual period 12/01/2011. Body mass index is 47.31 kg/(m^2). ECOG PERFORMANCE STATUS: 0 - Asymptomatic   General appearance: alert, cooperative and appears stated age Lymph nodes: Cervical, supraclavicular, and axillary nodes normal. Resp: clear to auscultation bilaterally Back: symmetric, no curvature. ROM normal. No CVA tenderness. Cardio: regular rate and rhythm GI: soft, non-tender; bowel sounds normal; no masses,  no organomegaly Extremities: extremities normal, atraumatic, no cyanosis or edema Neurologic: Grossly normal   LABORATORY DATA: Lab Results  Component Value Date   WBC 5.3 02/16/2012   HGB 11.2* 02/16/2012   HCT 35.0 02/16/2012   MCV 76.5* 02/16/2012   PLT 218  02/16/2012      Chemistry      Component Value Date/Time   NA 140 02/16/2012 1250   NA 137 01/23/2012 0929   K 4.1 02/16/2012 1250   K 4.9 01/23/2012 0929   CL 106 02/16/2012 1250   CL 101 01/23/2012 0929   CO2 28 02/16/2012 1250   CO2 26 01/23/2012 0929   BUN 14.0 02/16/2012 1250   BUN 10 01/23/2012 0929   CREATININE 0.8 02/16/2012 1250   CREATININE 0.89 01/23/2012 0929      Component Value Date/Time   CALCIUM 9.9 02/16/2012 1250   CALCIUM 9.7 01/23/2012 0929   ALKPHOS 110 02/16/2012 1250   AST 14 02/16/2012 1250   ALT 12 02/16/2012 1250   BILITOT 0.41 02/16/2012 1250       RADIOGRAPHIC STUDIES:  No results found.  ASSESSMENT: 51 year old female with  1. 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 She is doing well post operatively   PLAN: She will proceed with radiation therapy.Once she completes that then we will begin her on tamoxifen 20 mg daily for a duration of 5 years.   All questions were answered. The patient knows to call the clinic with any problems, questions or concerns. We can certainly see the patient much sooner if necessary.  I spent 25 minutes counseling the patient face to face. The total time spent in the appointment was 30 minutes.    Drue Second, MD Medical/Oncology [facility] [phone] (beeper) [phone] (Office)

## 2012-03-01 ENCOUNTER — Encounter: Payer: Self-pay | Admitting: Obstetrics and Gynecology

## 2012-03-01 ENCOUNTER — Ambulatory Visit (INDEPENDENT_AMBULATORY_CARE_PROVIDER_SITE_OTHER): Payer: BC Managed Care – PPO | Admitting: Obstetrics and Gynecology

## 2012-03-01 DIAGNOSIS — D219 Benign neoplasm of connective and other soft tissue, unspecified: Secondary | ICD-10-CM

## 2012-03-01 DIAGNOSIS — D259 Leiomyoma of uterus, unspecified: Secondary | ICD-10-CM

## 2012-03-01 NOTE — Patient Instructions (Signed)
Call if any problems

## 2012-03-01 NOTE — Progress Notes (Signed)
Patient came back today for a postoperative visit after vaginal hysterectomy for fibroids, anemia and menorrhagia. She is made complete recovery. Her hemoglobin is now 11.2. She remains on iron. She is having no vaginal discharge or bleeding. She started radiation for her breast cancer today.  Exam: External and vaginal within normal limits with cuff healing well. Bimanual is normal.  Assessment: Normal postoperative exam  Plan: Continue iron Until  oncologist says she can stop. Return in July for annual exam.

## 2012-03-22 ENCOUNTER — Institutional Professional Consult (permissible substitution): Payer: BC Managed Care – PPO | Admitting: Gynecology

## 2012-04-04 ENCOUNTER — Encounter (INDEPENDENT_AMBULATORY_CARE_PROVIDER_SITE_OTHER): Payer: BC Managed Care – PPO | Admitting: Surgery

## 2012-04-10 ENCOUNTER — Telehealth: Payer: Self-pay | Admitting: *Deleted

## 2012-04-10 NOTE — Telephone Encounter (Signed)
placed patient on schedule NP

## 2012-04-10 NOTE — Telephone Encounter (Signed)
placed patient on schedule NP  

## 2012-04-27 ENCOUNTER — Ambulatory Visit: Payer: BC Managed Care – PPO | Admitting: Oncology

## 2012-04-27 ENCOUNTER — Ambulatory Visit: Payer: BC Managed Care – PPO | Admitting: Adult Health

## 2012-04-27 ENCOUNTER — Other Ambulatory Visit: Payer: BC Managed Care – PPO | Admitting: Lab

## 2012-04-27 ENCOUNTER — Encounter: Payer: Self-pay | Admitting: *Deleted

## 2012-05-01 ENCOUNTER — Telehealth: Payer: Self-pay | Admitting: Oncology

## 2012-05-01 NOTE — Telephone Encounter (Signed)
lmonvm for pt re appt for 1/27 and mailed schedule

## 2012-05-10 ENCOUNTER — Ambulatory Visit (INDEPENDENT_AMBULATORY_CARE_PROVIDER_SITE_OTHER): Payer: BC Managed Care – PPO | Admitting: Surgery

## 2012-05-10 ENCOUNTER — Encounter (INDEPENDENT_AMBULATORY_CARE_PROVIDER_SITE_OTHER): Payer: Self-pay | Admitting: Surgery

## 2012-05-10 VITALS — BP 130/84 | HR 74 | Temp 96.6°F | Resp 16 | Ht 69.0 in | Wt 324.8 lb

## 2012-05-10 DIAGNOSIS — Z853 Personal history of malignant neoplasm of breast: Secondary | ICD-10-CM

## 2012-05-10 NOTE — Patient Instructions (Signed)
See me again in six months, after your mammogram

## 2012-05-10 NOTE — Progress Notes (Signed)
NAME: Cornelious M Stroble       DOB: 1961-04-05           DATE: 05/10/2012       MRN: [id number]   THELDA GAGAN is a 52 y.o.Marland Kitchenfemale who presents for routine followup of her Stage ) right breast cancer diagnosed in July 2013 and treated with Lumpectomy and radiation.She may do anti-estrogen but has not yet seen Dr Welton Flakes for her post radiation followup. She has no problems or concerns on either side.  PFSH: She has had no significant changes since the last visit here.  ROS: There have been no significant changes since the last visit here  EXAM:  VS: BP 130/84  Pulse 74  Temp 96.6 F (35.9 C) (Temporal)  Resp 16  Ht 5\' 9"  (1.753 m)  Wt 324 lb 12.8 oz (147.328 kg)  BMI 47.96 kg/m2  General: The patient is alert, oriented, generally healthy appearing, NAD. Mood and affect are normal.  Breasts:  Symmetric, almost no radiation changes noted  Lymphatics: She has no axillary or supraclavicular adenopathy on either side.  Extremities: Full ROM of the surgical side with no lymphedema noted.  Data Reviewed: No new data  Impression: Doing well, with no evidence of recurrent cancer or new cancer  Plan: RTC six months for her one year follow up

## 2012-05-14 ENCOUNTER — Other Ambulatory Visit: Payer: BC Managed Care – PPO | Admitting: Lab

## 2012-05-14 ENCOUNTER — Ambulatory Visit: Payer: BC Managed Care – PPO | Admitting: Adult Health

## 2012-05-28 ENCOUNTER — Telehealth: Payer: Self-pay | Admitting: Oncology

## 2012-05-28 ENCOUNTER — Other Ambulatory Visit (HOSPITAL_BASED_OUTPATIENT_CLINIC_OR_DEPARTMENT_OTHER): Payer: BC Managed Care – PPO

## 2012-05-28 ENCOUNTER — Encounter: Payer: Self-pay | Admitting: Adult Health

## 2012-05-28 ENCOUNTER — Ambulatory Visit (HOSPITAL_BASED_OUTPATIENT_CLINIC_OR_DEPARTMENT_OTHER): Payer: BC Managed Care – PPO | Admitting: Adult Health

## 2012-05-28 VITALS — BP 156/84 | HR 74 | Temp 98.5°F | Resp 20 | Ht 69.0 in | Wt 331.0 lb

## 2012-05-28 DIAGNOSIS — D509 Iron deficiency anemia, unspecified: Secondary | ICD-10-CM

## 2012-05-28 DIAGNOSIS — Z170 Estrogen receptor positive status [ER+]: Secondary | ICD-10-CM

## 2012-05-28 DIAGNOSIS — D0590 Unspecified type of carcinoma in situ of unspecified breast: Secondary | ICD-10-CM

## 2012-05-28 DIAGNOSIS — C50911 Malignant neoplasm of unspecified site of right female breast: Secondary | ICD-10-CM

## 2012-05-28 LAB — CBC WITH DIFFERENTIAL/PLATELET
Basophils Absolute: 0 10*3/uL (ref 0.0–0.1)
Eosinophils Absolute: 0.1 10*3/uL (ref 0.0–0.5)
HCT: 37.5 % (ref 34.8–46.6)
LYMPH%: 23.1 % (ref 14.0–49.7)
MCHC: 32 g/dL (ref 31.5–36.0)
MONO#: 0.2 10*3/uL (ref 0.1–0.9)
NEUT#: 2.9 10*3/uL (ref 1.5–6.5)
NEUT%: 70.2 % (ref 38.4–76.8)
Platelets: 197 10*3/uL (ref 145–400)
WBC: 4.2 10*3/uL (ref 3.9–10.3)

## 2012-05-28 MED ORDER — TAMOXIFEN CITRATE 20 MG PO TABS: 20.0000 mg | ORAL_TABLET | Freq: Every day | ORAL | Status: DC

## 2012-05-28 NOTE — Telephone Encounter (Signed)
gv pt appt schedule for May. °

## 2012-05-28 NOTE — Patient Instructions (Addendum)
Doing well.  No sign of recurrence.  We will see you back in 3 months to evaluate how well you are tolerating the tamoxifen.   Tamoxifen oral tablet What is this medicine? TAMOXIFEN (ta MOX i fen) blocks the effects of estrogen. It is commonly used to treat breast cancer. It is also used to decrease the chance of breast cancer coming back in women who have received treatment for the disease. It may also help prevent breast cancer in women who have a high risk of developing breast cancer. This medicine may be used for other purposes; ask your health care provider or pharmacist if you have questions. What should I tell my health care provider before I take this medicine? They need to know if you have any of these conditions: -blood clots -blood disease -cataracts or impaired eyesight -endometriosis -high calcium levels -high cholesterol -irregular menstrual cycles -liver disease -stroke -uterine fibroids -an unusual or allergic reaction to tamoxifen, other medicines, foods, dyes, or preservatives -pregnant or trying to get pregnant -breast-feeding How should I use this medicine? Take this medicine by mouth with a glass of water. Follow the directions on the prescription label. You can take it with or without food. Take your medicine at regular intervals. Do not take your medicine more often than directed. Do not stop taking except on your doctor's advice. A special MedGuide will be given to you by the pharmacist with each prescription and refill. Be sure to read this information carefully each time. Talk to your pediatrician regarding the use of this medicine in children. While this drug may be prescribed for selected conditions, precautions do apply. Overdosage: If you think you have taken too much of this medicine contact a poison control center or emergency room at once. NOTE: This medicine is only for you. Do not share this medicine with others. What if I miss a dose? If you miss a  dose, take it as soon as you can. If it is almost time for your next dose, take only that dose. Do not take double or extra doses. What may interact with this medicine? -aminoglutethimide -bromocriptine -chemotherapy drugs -female hormones, like estrogens and birth control pills -letrozole -medroxyprogesterone -phenobarbital -rifampin -warfarin This list may not describe all possible interactions. Give your health care provider a list of all the medicines, herbs, non-prescription drugs, or dietary supplements you use. Also tell them if you smoke, drink alcohol, or use illegal drugs. Some items may interact with your medicine. What should I watch for while using this medicine? Visit your doctor or health care professional for regular checks on your progress. You will need regular pelvic exams, breast exams, and mammograms. If you are taking this medicine to reduce your risk of getting breast cancer, you should know that this medicine does not prevent all types of breast cancer. If breast cancer or other problems occur, there is no guarantee that it will be found at an early stage. Do not become pregnant while taking this medicine or for 2 months after stopping this medicine. Stop taking this medicine if you get pregnant or think you are pregnant and contact your doctor. This medicine may harm your unborn baby. Women who can possibly become pregnant should use birth control methods that do not use hormones during tamoxifen treatment and for 2 months after therapy has stopped. Talk with your health care provider for birth control advice. Do not breast feed while taking this medicine. What side effects may I notice from receiving this medicine?  Side effects that you should report to your doctor or health care professional as soon as possible: -changes in vision (blurred vision) -changes in your menstrual cycle -difficulty breathing or shortness of breath -difficulty walking or talking -new breast  lumps -numbness -pelvic pain or pressure -redness, blistering, peeling or loosening of the skin, including inside the mouth -skin rash or itching (hives) -sudden chest pain -swelling of lips, face, or tongue -swelling, pain or tenderness in your calf or leg -unusual bruising or bleeding -vaginal discharge that is bloody, brown, or rust -weakness -yellowing of the whites of the eyes or skin Side effects that usually do not require medical attention (report to your doctor or health care professional if they continue or are bothersome): -fatigue -hair loss, although uncommon and is usually mild -headache -hot flashes -impotence (in men) -nausea, vomiting (mild) -vaginal discharge (white or clear) This list may not describe all possible side effects. Call your doctor for medical advice about side effects. You may report side effects to FDA at 1-800-FDA-1088. Where should I keep my medicine? Keep out of the reach of children. Store at room temperature between 20 and 25 degrees C (68 and 77 degrees F). Protect from light. Keep container tightly closed. Throw away any unused medicine after the expiration date. NOTE: This sheet is a summary. It may not cover all possible information. If you have questions about this medicine, talk to your doctor, pharmacist, or health care provider.  2012, [employer]. (12/20/2007 12:01:56 PM)

## 2012-05-28 NOTE — Progress Notes (Signed)
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 She underwent radiation therapy in [place] under the care of Dr. Thersa Salt from 10/28 to 04/19/12.    3. Tamoxifen 20mg  daily started on 05/28/12  CURRENT THERAPY:daily Tamoxifen   INTERVAL HISTORY: Kayla Neal 52 y.o. female returns for follow up visit. She has completed her radiation therapy.  She remains fatigued, but otherwise is well and w/o concerns.  She had a TAH, but kept her ovaries on 01/26/12.  We updated her health maintenance below.  She has not exercised recently, and doesn't eat a diet rich in fruits/vegetables.    MEDICAL HISTORY: Past Medical History  Diagnosis Date  . Nasal congestion   . Wheezing     hx  - at allergy season only  . Hypertension     under control without medication as of 11/11/11-no meds for  at least 1 yr  . Asthma     seasonal allergies related-prn albuterol  . GERD (gastroesophageal reflux disease)     no meds required in past 3 years  . Arthritis     knee pain-uses acetaminophen for pain control prn  . Cervical polyp   . 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    . Allergy     LISINOPRIL=N,V  . Anemia 10/18/2011    placed on iron tid for Hgb of 8  . Fibroids     MULTIPLE IN ENDOMETRIAL CAVITY  . Uterine bleeding, dysfunctional     severe  . Submucous myoma of uterus   . Use of megestrol acetate (Megace)     For uterine bleeding    ALLERGIES:  is allergic to lisinopril; bee pollen; and oxycodone.  MEDICATIONS:  Current Outpatient Prescriptions  Medication Sig Dispense Refill  . Acetaminophen 650 MG TABS Take 1-2 tablets by mouth daily as needed. For mild pain      . albuterol (PROVENTIL HFA;VENTOLIN HFA) 108 (90 BASE) MCG/ACT inhaler  Inhale 1 puff into the lungs daily as needed. For asthma symptoms      . EVENING PRIMROSE OIL PO Take by mouth daily.       . Multiple Vitamins-Minerals (ONE-A-DAY MENOPAUSE FORMULA PO) Take 1 tablet by mouth daily.       . valsartan (DIOVAN) 160 MG tablet Take 160 mg by mouth daily.      . tamoxifen (NOLVADEX) 20 MG tablet Take 1 tablet (20 mg total) by mouth daily.  30 tablet  2   No current facility-administered medications for this visit.    SURGICAL HISTORY:  Past Surgical History  Procedure Laterality Date  . Tooth extraction  2000  . Breast biopsy  11/14/2011    Procedure: BREAST BIOPSY WITH NEEDLE LOCALIZATION;  Surgeon: Currie Paris, MD;  Location: [facility];  Service: General;  Laterality: Right;  needle localization right breast calcifications  . Breast lumpectomy  11/14/11    RIGHT  BREAST=DUCTAL CA IN SITU,W/CALCIFICATIONS,HIGH GRADE,2.4CM,SURGICAL RESECTION MARGINS NEG,  DR. C. STRECK  . Endometrial biopsy  12/09/11    UTERUS=BENIGN ENDOMETRIAL GLANDS,BENIGN SQUAMOUS EPITHELIAL CELLS,NO DYSPLASIA OR MALIGNANCY  . Cervix polyp  10/17/11    NO MALIGNANCY  . Tonsillectomy    . Vaginal hysterectomy  01/26/2012  Procedure: HYSTERECTOMY VAGINAL;  Surgeon: [name], MD;  Location: [facility];  Service: Gynecology;  Laterality: N/A;  . Vaginal hysterectomy      REVIEW OF SYSTEMS:   General: fatigue (+), night sweats (-), fever (-), pain (-) Lymph: palpable nodes (-) HEENT: vision changes (-), mucositis (-), gum bleeding (-), epistaxis (-) Cardiovascular: chest pain (-), palpitations (-) Pulmonary: shortness of breath (-), dyspnea on exertion (-), cough (-), hemoptysis (-) GI:  Early satiety (-), melena (-), dysphagia (-), nausea/vomiting (-), diarrhea (-) GU: dysuria (-), hematuria (-), incontinence (-) Musculoskeletal: joint swelling (-), joint pain (-), back pain (-) Neuro: weakness (-), numbness (-), headache (-), confusion (-) Skin: Rash (-),  lesions (-), dryness (-) Psych: depression (-), suicidal/homicidal ideation (-), feeling of hopelessness (-)   Health Maintenance  Mammogram: 12/2011 Colonoscopy:never Bone Density Scan: never Pap Smear: 10/2011 Eye Exam: 04/2011 Vitamin D Level: 2012 Lipid Panel:10/2011    PHYSICAL EXAMINATION: Blood pressure 156/84, pulse 74, temperature 98.5 F (36.9 C), temperature source Oral, resp. rate 20, height 5\' 9"  (1.753 m), weight 331 lb (150.141 kg). Body mass index is 48.86 kg/(m^2). General: Patient is a well appearing female in no acute distress HEENT: PERRLA, sclerae anicteric no conjunctival pallor, MMM Neck: supple, no palpable adenopathy Lungs: clear to auscultation bilaterally, no wheezes, rhonchi, or rales Cardiovascular: regular rate rhythm, S1, S2, no murmurs, rubs or gallops Abdomen: Soft, non-tender, non-distended, normoactive bowel sounds, no HSM Extremities: warm and well perfused, no clubbing, cyanosis, or edema Skin: No rashes or lesions Neuro: Non-focal Breasts: right breast with radiation changes to the skin.  Lumpectomy site well healed, no nodularity or sign of recurrence.  Left breast: no nodularity, no masses ECOG PERFORMANCE STATUS: 0 - Asymptomatic   LABORATORY DATA: Lab Results  Component Value Date   WBC 4.2 05/28/2012   HGB 12.0 05/28/2012   HCT 37.5 05/28/2012   MCV 80.1 05/28/2012   PLT 197 05/28/2012      Chemistry      Component Value Date/Time   NA 140 02/16/2012 1250   NA 137 01/23/2012 0929   K 4.1 02/16/2012 1250   K 4.9 01/23/2012 0929   CL 106 02/16/2012 1250   CL 101 01/23/2012 0929   CO2 28 02/16/2012 1250   CO2 26 01/23/2012 0929   BUN 14.0 02/16/2012 1250   BUN 10 01/23/2012 0929   CREATININE 0.8 02/16/2012 1250   CREATININE 0.89 01/23/2012 0929      Component Value Date/Time   CALCIUM 9.9 02/16/2012 1250   CALCIUM 9.7 01/23/2012 0929   ALKPHOS 110 02/16/2012 1250   AST 14 02/16/2012 1250   ALT 12 02/16/2012 1250   BILITOT 0.41  02/16/2012 1250       RADIOGRAPHIC STUDIES:  No results found.  ASSESSMENT: 52 year old female with  1. 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 She is doing well post operatively.  She completed radiation therapy, and now will start Tamoxifen 20mg  daily for 5 years.     PLAN:  1. Ms. Yodice and I discussed her health maintenance and Tamoxifen.  I gave her information to read, and reviewed the indication, adverse effects, and to call us with any concerns.  She and I discussed exercise, and she has a plan to start walking in 2 weeks after the snow.  We will see her back in 3 months.    All questions were answered. The patient knows to call the clinic with any  problems, questions or concerns. We can certainly see the patient much sooner if necessary.  I spent 25 minutes counseling the patient face to face. The total time spent in the appointment was 30 minutes.  Cherie Ouch Lyn Hollingshead, NP Medical Oncology [facility] Phone: [phone]

## 2012-05-28 NOTE — Telephone Encounter (Signed)
lmonvm adviisng the pt of her 3 mnth f/u appt in may

## 2012-06-02 ENCOUNTER — Other Ambulatory Visit: Payer: Self-pay

## 2012-08-17 ENCOUNTER — Telehealth: Payer: Self-pay | Admitting: Oncology

## 2012-08-20 ENCOUNTER — Ambulatory Visit: Payer: BC Managed Care – PPO | Admitting: Adult Health

## 2012-08-20 ENCOUNTER — Other Ambulatory Visit: Payer: BC Managed Care – PPO | Admitting: Lab

## 2012-09-04 ENCOUNTER — Other Ambulatory Visit: Payer: BC Managed Care – PPO | Admitting: Lab

## 2012-09-04 ENCOUNTER — Ambulatory Visit: Payer: BC Managed Care – PPO | Admitting: Adult Health

## 2012-09-06 ENCOUNTER — Other Ambulatory Visit: Payer: Self-pay | Admitting: *Deleted

## 2012-09-12 ENCOUNTER — Other Ambulatory Visit: Payer: Self-pay | Admitting: *Deleted

## 2012-09-12 DIAGNOSIS — C50911 Malignant neoplasm of unspecified site of right female breast: Secondary | ICD-10-CM

## 2012-09-12 MED ORDER — TAMOXIFEN CITRATE 20 MG PO TABS: 20.0000 mg | ORAL_TABLET | Freq: Every day | ORAL | Status: DC

## 2012-09-14 ENCOUNTER — Encounter (INDEPENDENT_AMBULATORY_CARE_PROVIDER_SITE_OTHER): Payer: Self-pay | Admitting: Surgery

## 2012-09-17 ENCOUNTER — Ambulatory Visit (HOSPITAL_BASED_OUTPATIENT_CLINIC_OR_DEPARTMENT_OTHER): Payer: BC Managed Care – PPO | Admitting: Adult Health

## 2012-09-17 ENCOUNTER — Telehealth: Payer: Self-pay | Admitting: Oncology

## 2012-09-17 ENCOUNTER — Encounter: Payer: Self-pay | Admitting: Adult Health

## 2012-09-17 ENCOUNTER — Other Ambulatory Visit (HOSPITAL_BASED_OUTPATIENT_CLINIC_OR_DEPARTMENT_OTHER): Payer: BC Managed Care – PPO | Admitting: Lab

## 2012-09-17 VITALS — BP 152/80 | HR 71 | Temp 98.0°F | Resp 20 | Ht 69.0 in | Wt 329.4 lb

## 2012-09-17 DIAGNOSIS — C50919 Malignant neoplasm of unspecified site of unspecified female breast: Secondary | ICD-10-CM

## 2012-09-17 DIAGNOSIS — C50911 Malignant neoplasm of unspecified site of right female breast: Secondary | ICD-10-CM

## 2012-09-17 DIAGNOSIS — D509 Iron deficiency anemia, unspecified: Secondary | ICD-10-CM

## 2012-09-17 LAB — COMPREHENSIVE METABOLIC PANEL (CC13)
AST: 17 U/L (ref 5–34)
Albumin: 3.2 g/dL — ABNORMAL LOW (ref 3.5–5.0)
BUN: 12.7 mg/dL (ref 7.0–26.0)
Calcium: 9 mg/dL (ref 8.4–10.4)
Chloride: 104 mEq/L (ref 98–107)
Glucose: 93 mg/dl (ref 70–99)
Potassium: 3.7 mEq/L (ref 3.5–5.1)
Sodium: 138 mEq/L (ref 136–145)
Total Protein: 6.9 g/dL (ref 6.4–8.3)

## 2012-09-17 LAB — CBC WITH DIFFERENTIAL/PLATELET
BASO%: 0.3 % (ref 0.0–2.0)
EOS%: 1.5 % (ref 0.0–7.0)
HCT: 34.9 % (ref 34.8–46.6)
LYMPH%: 18.9 % (ref 14.0–49.7)
MCH: 26 pg (ref 25.1–34.0)
MCHC: 32.6 g/dL (ref 31.5–36.0)
MCV: 80 fL (ref 79.5–101.0)
NEUT%: 72.9 % (ref 38.4–76.8)
Platelets: 187 10*3/uL (ref 145–400)

## 2012-09-17 NOTE — Patient Instructions (Addendum)
Doing well.  No sign of recurrence.  Continue Tamoxifen, we will see you back in 6 months.  Please call us if you have any questions or concerns.

## 2012-09-17 NOTE — Progress Notes (Signed)
OFFICE PROGRESS NOTE  CC  Kayla Paganini, MD 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 She underwent radiation therapy in [place] under the care of Dr. Thersa Salt from 10/28 to 04/19/12.    3. Tamoxifen 20mg  daily started on 05/28/12  CURRENT THERAPY:daily Tamoxifen   INTERVAL HISTORY: Kayla KOCHEL 52 y.o. female returns for follow up visit. She has been taking the Tamoxifen, and became fatigued and had hot flashes and joint pains with it.  They are improving.  She is taking one a day menopause and is feeling better.  Otherwise a 10 point ROS is neg.   MEDICAL HISTORY: Past Medical History  Diagnosis Date  . Nasal congestion   . Wheezing     hx  - at allergy season only  . Hypertension     under control without medication as of 11/11/11-no meds for  at least 1 yr  . Asthma     seasonal allergies related-prn albuterol  . GERD (gastroesophageal reflux disease)     no meds required in past 3 years  . Arthritis     knee pain-uses acetaminophen for pain control prn  . Cervical polyp   . 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    . Allergy     LISINOPRIL=N,V  . Anemia 10/18/2011    placed on iron tid for Hgb of 8  . Fibroids     MULTIPLE IN ENDOMETRIAL CAVITY  . Uterine bleeding, dysfunctional     severe  . Submucous myoma of uterus   . Use of megestrol acetate (Megace)     For uterine bleeding    ALLERGIES:  is allergic to lisinopril; bee pollen; and oxycodone.  MEDICATIONS:  Current Outpatient Prescriptions  Medication Sig Dispense Refill  . albuterol (PROVENTIL HFA;VENTOLIN HFA) 108 (90 BASE) MCG/ACT inhaler Inhale 1 puff into the lungs daily as needed. For asthma symptoms      . EVENING PRIMROSE OIL PO Take by mouth daily.       . Multiple Vitamins-Minerals  (ONE-A-DAY MENOPAUSE FORMULA PO) Take 1 tablet by mouth daily.       . naproxen sodium (ANAPROX) 220 MG tablet Take 220 mg by mouth as needed.      . tamoxifen (NOLVADEX) 20 MG tablet Take 1 tablet (20 mg total) by mouth daily.  30 tablet  0  . valsartan (DIOVAN) 160 MG tablet Take 160 mg by mouth daily.      . Acetaminophen 650 MG TABS Take 1-2 tablets by mouth daily as needed. For mild pain       No current facility-administered medications for this visit.    SURGICAL HISTORY:  Past Surgical History  Procedure Laterality Date  . Tooth extraction  2000  . Breast biopsy  11/14/2011    Procedure: BREAST BIOPSY WITH NEEDLE LOCALIZATION;  Surgeon: Currie Paris, MD;  Location: [facility];  Service: General;  Laterality: Right;  needle localization right breast calcifications  . Breast lumpectomy  11/14/11    RIGHT  BREAST=DUCTAL CA IN SITU,W/CALCIFICATIONS,HIGH GRADE,2.4CM,SURGICAL RESECTION MARGINS NEG,  DR. C. STRECK  . Endometrial biopsy  12/09/11    UTERUS=BENIGN ENDOMETRIAL GLANDS,BENIGN SQUAMOUS EPITHELIAL CELLS,NO DYSPLASIA OR MALIGNANCY  . Cervix polyp  10/17/11    NO MALIGNANCY  . Tonsillectomy    .  Vaginal hysterectomy  01/26/2012    Procedure: HYSTERECTOMY VAGINAL;  Surgeon: Kayla Paganini, MD;  Location: [facility];  Service: Gynecology;  Laterality: N/A;  . Vaginal hysterectomy      REVIEW OF SYSTEMS:   General: fatigue (+), night sweats (-), fever (-), pain (-) Lymph: palpable nodes (-) HEENT: vision changes (-), mucositis (-), gum bleeding (-), epistaxis (-) Cardiovascular: chest pain (-), palpitations (-) Pulmonary: shortness of breath (-), dyspnea on exertion (-), cough (-), hemoptysis (-) GI:  Early satiety (-), melena (-), dysphagia (-), nausea/vomiting (-), diarrhea (-) GU: dysuria (-), hematuria (-), incontinence (-) Musculoskeletal: joint swelling (-), joint pain (-), back pain (-) Neuro: weakness (-), numbness (-), headache (-), confusion  (-) Skin: Rash (-), lesions (-), dryness (-) Psych: depression (-), suicidal/homicidal ideation (-), feeling of hopelessness (-)   Health Maintenance Mammogram: 10/2011 Colonoscopy:never Bone Density Scan: never Pap Smear: 10/2011 Eye Exam: 04/2011 Vitamin D Level: 2012 Lipid Panel:10/2011  PHYSICAL EXAMINATION: Blood pressure 152/80, pulse 71, temperature 98 F (36.7 C), temperature source Oral, resp. rate 20, height 5\' 9"  (1.753 m), weight 329 lb 6.4 oz (149.415 kg). Body mass index is 48.62 kg/(m^2). General: Patient is a well appearing female in no acute distress HEENT: PERRLA, sclerae anicteric no conjunctival pallor, MMM Neck: supple, no palpable adenopathy Lungs: clear to auscultation bilaterally, no wheezes, rhonchi, or rales Cardiovascular: regular rate rhythm, S1, S2, no murmurs, rubs or gallops Abdomen: Soft, non-tender, non-distended, normoactive bowel sounds, no HSM Extremities: warm and well perfused, no clubbing, cyanosis, or edema Skin: No rashes or lesions Neuro: Non-focal Breasts: right breast with radiation changes to the skin.  Lumpectomy site well healed, no nodularity or sign of recurrence.  Left breast: no nodularity, no masses ECOG PERFORMANCE STATUS: 0 - Asymptomatic   LABORATORY DATA: Lab Results  Component Value Date   WBC 5.7 09/17/2012   HGB 11.4* 09/17/2012   HCT 34.9 09/17/2012   MCV 80.0 09/17/2012   PLT 187 09/17/2012      Chemistry      Component Value Date/Time   NA 140 02/16/2012 1250   NA 137 01/23/2012 0929   K 4.1 02/16/2012 1250   K 4.9 01/23/2012 0929   CL 106 02/16/2012 1250   CL 101 01/23/2012 0929   CO2 28 02/16/2012 1250   CO2 26 01/23/2012 0929   BUN 14.0 02/16/2012 1250   BUN 10 01/23/2012 0929   CREATININE 0.8 02/16/2012 1250   CREATININE 0.89 01/23/2012 0929      Component Value Date/Time   CALCIUM 9.9 02/16/2012 1250   CALCIUM 9.7 01/23/2012 0929   ALKPHOS 110 02/16/2012 1250   AST 14 02/16/2012 1250   ALT 12 02/16/2012 1250    BILITOT 0.41 02/16/2012 1250       RADIOGRAPHIC STUDIES:  No results found.  ASSESSMENT: 52 year old female with  1. 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 She is doing well post operatively.  She completed radiation therapy, and now will start Tamoxifen 20mg  daily for 5 years.     PLAN:  1. Kayla Neal and I discussed her health maintenance and Tamoxifen.  Doing well.  No sign of recurrence.  She is starting back exercising and will continue this.  We will see her back in 6 months.    All questions were answered. The patient knows to call the clinic with any problems, questions or concerns. We can certainly see the patient much sooner if necessary.  I  spent 25 minutes counseling the patient face to face. The total time spent in the appointment was 30 minutes.  Cherie Ouch Lyn Hollingshead, NP Medical Oncology [facility] Phone: [phone]

## 2012-09-18 ENCOUNTER — Telehealth: Payer: Self-pay | Admitting: Medical Oncology

## 2012-09-18 ENCOUNTER — Telehealth: Payer: Self-pay | Admitting: *Deleted

## 2012-09-18 ENCOUNTER — Other Ambulatory Visit: Payer: Self-pay | Admitting: Adult Health

## 2012-09-18 DIAGNOSIS — D509 Iron deficiency anemia, unspecified: Secondary | ICD-10-CM | POA: Insufficient documentation

## 2012-09-18 NOTE — Telephone Encounter (Signed)
Per staff phone call and POF I have schedueld appts.  JMW  

## 2012-09-18 NOTE — Telephone Encounter (Signed)
Per NP, informed patient of her ferritin/iron/TIBC results show she is iron deficient and that NP would like for pt to receive IV feraheme. I explained to patient the procedure and that our scheduling dept will be contacting her to set up an appt. Patient expressed verbal understanding, no further questions at this time. Patient knows to call office with questions and concerns.

## 2012-09-18 NOTE — Telephone Encounter (Signed)
sw pt gv appt for fera scheduled on 09/24/12 @2pm , and a lab on 12/19/12 @3pm . Pt is aware of both appts...td

## 2012-09-18 NOTE — Telephone Encounter (Signed)
Message copied by Rexene Edison on Tue Sep 18, 2012  9:27 AM ------      Message from: Laural Golden      Created: Tue Sep 18, 2012  8:41 AM       Patient has iron deficiency based on her labs from the past visit.  She needs IV feraheme.  Please call patient and inform her.  I will place all the orders.  Thanks, L      ----- Message -----         From: [facility]         Sent: 09/17/2012   3:10 PM           To: Augustin Schooling, NP                   ------

## 2012-09-20 ENCOUNTER — Telehealth: Payer: Self-pay | Admitting: Medical Oncology

## 2012-09-20 ENCOUNTER — Ambulatory Visit (INDEPENDENT_AMBULATORY_CARE_PROVIDER_SITE_OTHER): Payer: BC Managed Care – PPO | Admitting: General Practice

## 2012-09-20 DIAGNOSIS — J4520 Mild intermittent asthma, uncomplicated: Secondary | ICD-10-CM

## 2012-09-20 DIAGNOSIS — T782XXS Anaphylactic shock, unspecified, sequela: Secondary | ICD-10-CM

## 2012-09-20 DIAGNOSIS — I10 Essential (primary) hypertension: Secondary | ICD-10-CM

## 2012-09-20 DIAGNOSIS — J45909 Unspecified asthma, uncomplicated: Secondary | ICD-10-CM

## 2012-09-20 MED ORDER — EPINEPHRINE 0.3 MG/0.3ML IJ SOAJ: 0.3000 mg | Freq: Once | INTRAMUSCULAR | Status: DC

## 2012-09-20 MED ORDER — ALBUTEROL SULFATE HFA 108 (90 BASE) MCG/ACT IN AERS: 1.0000 | INHALATION_SPRAY | Freq: Every day | RESPIRATORY_TRACT | Status: DC | PRN

## 2012-09-20 NOTE — Telephone Encounter (Signed)
Patient called with a few questions regarding feraheme infusion and her lab results. Explained to pt the treatment, possible time length of treatment and procedure as well as since it's her first time we will have her stay an additional 30 minutes after treatment to monitor her. Pt expressed verbal understanding. Encouraged patient to call should she have any further questions or concerns. She expressed thanks.

## 2012-09-20 NOTE — Progress Notes (Signed)
  Subjective:    Patient ID: Kayla Neal, female    DOB: [birth year], 52 y.o.   MRN: [id number]  Asthma There is no cough, shortness of breath or wheezing. Pertinent negatives include no chest pain, fever, headaches, rhinorrhea or sore throat. Her past medical history is significant for asthma.  Reports she has had asthma for 20 years. Reports using her inhalers more now with the seasonal allergies. Reports inhalers to be effective. Reports being diagnosed with blood pressure six years ago. Reports diovan was dropping blood pressure to 105/50's and she felt dizzy. She recently had cancer and now finds blood pressure more elevated 130/80's. Reports oncologist informed her that cancer medications may have an affect on medications. Denies headaches or dizziness. Patient wishes to start back on Benicar, reports because diovan still makes her lightheaded at times.    Review of Systems  Constitutional: Negative for fever and chills.  HENT: Positive for sinus pressure. Negative for congestion, sore throat and rhinorrhea.   Respiratory: Negative for cough, chest tightness, shortness of breath and wheezing.   Cardiovascular: Negative for chest pain and palpitations.  Gastrointestinal: Negative for abdominal pain and blood in stool.  Genitourinary: Negative for dysuria, hematuria and difficulty urinating.  Neurological: Negative for dizziness, weakness and headaches.       Objective:   Physical Exam  Constitutional: She is oriented to person, place, and time. She appears well-developed and well-nourished.  HENT:  Head: Normocephalic and atraumatic.  Right Ear: External ear normal.  Left Ear: External ear normal.  Cardiovascular: Normal rate, regular rhythm and normal heart sounds.   No murmur heard. Pulmonary/Chest: Effort normal and breath sounds normal. No respiratory distress. She exhibits no tenderness.  Abdominal: Soft. Bowel sounds are normal. She exhibits no distension. There is no  tenderness.  Neurological: She is alert and oriented to person, place, and time.  Skin: Skin is warm and dry.  Psychiatric: She has a normal mood and affect.          Assessment & Plan:  1. Asthma, chronic, mild intermittent, uncomplicated - albuterol (PROVENTIL HFA;VENTOLIN HFA) 108 (90 BASE) MCG/ACT inhaler; Inhale 1 puff into the lungs daily as needed. For asthma symptoms  Dispense: 1 Inhaler; Refill: 6  2. Anaphylactic reaction, sequela - EPINEPHrine (EPI-PEN) 0.3 mg/0.3 mL DEVI; Inject 0.3 mLs (0.3 mg total) into the muscle once.  Dispense: 1 Device; Refill: 1  3. Hypertension -discontinue Diovan  -start tomorrow morning taking Benicar 20mg  daily -Monitor blood pressure and keep diary -RTO for blood pressure check in one week -call office is symptoms develop -Continue healthy diet -Continue working on exercise -Patient verbalized understanding -Coralie Keens, FNP-C

## 2012-09-24 ENCOUNTER — Ambulatory Visit (HOSPITAL_BASED_OUTPATIENT_CLINIC_OR_DEPARTMENT_OTHER): Payer: BC Managed Care – PPO

## 2012-09-24 VITALS — BP 98/67 | HR 77 | Temp 98.3°F | Resp 20

## 2012-09-24 DIAGNOSIS — D509 Iron deficiency anemia, unspecified: Secondary | ICD-10-CM

## 2012-09-24 MED ORDER — SODIUM CHLORIDE 0.9 % IV SOLN
Freq: Once | INTRAVENOUS | Status: AC
  Administered 2012-09-24: 15:00:00 via INTRAVENOUS

## 2012-09-24 MED ORDER — SODIUM CHLORIDE 0.9 % IJ SOLN
3.0000 mL | Freq: Once | INTRAMUSCULAR | Status: DC | PRN
  Filled 2012-09-24: qty 10

## 2012-09-24 MED ORDER — SODIUM CHLORIDE 0.9 % IV SOLN
1020.0000 mg | Freq: Once | INTRAVENOUS | Status: AC
  Administered 2012-09-24: 1020 mg via INTRAVENOUS
  Filled 2012-09-24: qty 34

## 2012-09-24 NOTE — Patient Instructions (Signed)
Ferumoxytol injection What is this medicine? FERUMOXYTOL is an iron complex. Iron is used to make healthy red blood cells, which carry oxygen and nutrients throughout the body. This medicine is used to treat iron deficiency anemia in people with chronic kidney disease. This medicine may be used for other purposes; ask your health care provider or pharmacist if you have questions. What should I tell my health care provider before I take this medicine? They need to know if you have any of these conditions: -anemia not caused by low iron levels -high levels of iron in the blood -magnetic resonance imaging (MRI) test scheduled -an unusual or allergic reaction to iron, other medicines, foods, dyes, or preservatives -pregnant or trying to get pregnant -breast-feeding How should I use this medicine? This medicine is for infusion into a vein. It is given by a health care professional in a hospital or clinic setting. Talk to your pediatrician regarding the use of this medicine in children. Special care may be needed. Overdosage: If you think you've taken too much of this medicine contact a poison control center or emergency room at once. Overdosage: If you think you have taken too much of this medicine contact a poison control center or emergency room at once. NOTE: This medicine is only for you. Do not share this medicine with others. What if I miss a dose? It is important not to miss your dose. Call your doctor or health care professional if you are unable to keep an appointment. What may interact with this medicine? This medicine may interact with the following medications: -other iron products This list may not describe all possible interactions. Give your health care provider a list of all the medicines, herbs, non-prescription drugs, or dietary supplements you use. Also tell them if you smoke, drink alcohol, or use illegal drugs. Some items may interact with your medicine. What should I watch  for while using this medicine? Visit your doctor or healthcare professional regularly. Tell your doctor or healthcare professional if your symptoms do not start to get better or if they get worse. You may need blood work done while you are taking this medicine. You may need to follow a special diet. Talk to your doctor. Foods that contain iron include: whole grains/cereals, dried fruits, beans, or peas, leafy green vegetables, and organ meats (liver, kidney). What side effects may I notice from receiving this medicine? Side effects that you should report to your doctor or health care professional as soon as possible: -allergic reactions like skin rash, itching or hives, swelling of the face, lips, or tongue -breathing problems -changes in blood pressure -feeling faint or lightheaded, falls -fever or chills -flushing, sweating, or hot feelings -swelling of the ankles or feet Side effects that usually do not require medical attention (Report these to your doctor or health care professional if they continue or are bothersome.): -diarrhea -headache -nausea, vomiting -stomach pain This list may not describe all possible side effects. Call your doctor for medical advice about side effects. You may report side effects to FDA at 1-800-FDA-1088. Where should I keep my medicine? This drug is given in a hospital or clinic and will not be stored at home. NOTE: This sheet is a summary. It may not cover all possible information. If you have questions about this medicine, talk to your doctor, pharmacist, or health care provider.  2013, [employer]. (12/26/2007 9:48:25 PM)  

## 2012-10-16 ENCOUNTER — Other Ambulatory Visit: Payer: Self-pay | Admitting: Emergency Medicine

## 2012-10-16 DIAGNOSIS — C50911 Malignant neoplasm of unspecified site of right female breast: Secondary | ICD-10-CM

## 2012-10-16 MED ORDER — TAMOXIFEN CITRATE 20 MG PO TABS: 20.0000 mg | ORAL_TABLET | Freq: Every day | ORAL | Status: DC

## 2012-10-29 ENCOUNTER — Ambulatory Visit
Admission: RE | Admit: 2012-10-29 | Discharge: 2012-10-29 | Disposition: A | Discharge status: Home / Self Care | Payer: BC Managed Care – PPO | Source: Ambulatory Visit | Attending: Adult Health | Admitting: Adult Health

## 2012-10-29 DIAGNOSIS — C50911 Malignant neoplasm of unspecified site of right female breast: Secondary | ICD-10-CM

## 2012-12-19 ENCOUNTER — Other Ambulatory Visit: Payer: BC Managed Care – PPO | Admitting: Lab

## 2012-12-19 ENCOUNTER — Telehealth: Payer: Self-pay | Admitting: Oncology

## 2012-12-19 NOTE — Telephone Encounter (Signed)
, °

## 2012-12-27 ENCOUNTER — Telehealth: Payer: Self-pay | Admitting: General Practice

## 2012-12-30 ENCOUNTER — Other Ambulatory Visit: Payer: Self-pay | Admitting: General Practice

## 2012-12-30 DIAGNOSIS — I10 Essential (primary) hypertension: Secondary | ICD-10-CM

## 2012-12-30 MED ORDER — LOSARTAN POTASSIUM 25 MG PO TABS: 25.0000 mg | ORAL_TABLET | Freq: Every day | ORAL | Status: DC

## 2012-12-30 NOTE — Progress Notes (Signed)
Left message for patient to discontinue benicar and start taking cozaar 25mg  daily and follow up in one week for blood pressure check.

## 2012-12-30 NOTE — Telephone Encounter (Signed)
Message left for patient to discontinue benicar and start taking cozaar 25mg  by mouth daily and follow up in one week for recheck of blood pressure.

## 2013-01-07 ENCOUNTER — Telehealth: Payer: Self-pay | Admitting: General Practice

## 2013-01-07 NOTE — Telephone Encounter (Signed)
Forward to mae to address

## 2013-01-11 NOTE — Telephone Encounter (Signed)
Attempted to contact patient and inform that we have discount cards for benicar, depending on insurance carrier. We will look at placing her on norvasc if discount card doesn't apply, due to insurance.

## 2013-01-11 NOTE — Telephone Encounter (Signed)
Pt will come for discount card and try it.

## 2013-01-16 ENCOUNTER — Other Ambulatory Visit: Payer: Self-pay | Admitting: General Practice

## 2013-01-16 DIAGNOSIS — I10 Essential (primary) hypertension: Secondary | ICD-10-CM

## 2013-01-16 MED ORDER — VALSARTAN 160 MG PO TABS: 160.0000 mg | ORAL_TABLET | Freq: Every day | ORAL | Status: DC

## 2013-01-16 NOTE — Progress Notes (Signed)
Patient prefers to restart diovan 160mg  daily and discontinue losartan. Script sent to pharmacy. Patient aware and verbalized understanding.

## 2013-02-06 ENCOUNTER — Encounter: Payer: Self-pay | Admitting: General Practice

## 2013-02-06 ENCOUNTER — Telehealth: Payer: Self-pay | Admitting: General Practice

## 2013-02-06 ENCOUNTER — Ambulatory Visit (INDEPENDENT_AMBULATORY_CARE_PROVIDER_SITE_OTHER): Payer: BC Managed Care – PPO | Admitting: General Practice

## 2013-02-06 VITALS — BP 161/92 | HR 75 | Temp 98.2°F | Wt 339.0 lb

## 2013-02-06 DIAGNOSIS — J329 Chronic sinusitis, unspecified: Secondary | ICD-10-CM

## 2013-02-06 MED ORDER — AZITHROMYCIN 250 MG PO TABS: ORAL_TABLET | ORAL | Status: DC

## 2013-02-06 NOTE — Patient Instructions (Signed)

## 2013-02-06 NOTE — Progress Notes (Signed)
  Subjective:    Patient ID: Kayla Neal, female    DOB: 15-Jun-1960, 52 y.o.   MRN: [id number]  Sinusitis This is a new problem. The current episode started in the past 7 days. The problem is unchanged. There has been no fever. Associated symptoms include sinus pressure. Pertinent negatives include no chills, congestion, coughing, shortness of breath or sore throat. Past treatments include nothing.       Review of Systems  Constitutional: Negative for chills.  HENT: Positive for sinus pressure. Negative for congestion and sore throat.   Respiratory: Negative for cough, chest tightness and shortness of breath.   Cardiovascular: Negative for chest pain and palpitations.  All other systems reviewed and are negative.       Objective:   Physical Exam  Constitutional: She appears well-developed and well-nourished.  HENT:  Head: Normocephalic and atraumatic.  Nose: Right sinus exhibits maxillary sinus tenderness and frontal sinus tenderness. Left sinus exhibits maxillary sinus tenderness and frontal sinus tenderness.  Mouth/Throat: Posterior oropharyngeal erythema present.  Neck: Normal range of motion. Neck supple. No thyromegaly present.  Cardiovascular: Normal rate, regular rhythm and normal heart sounds.   No murmur heard. Pulmonary/Chest: Effort normal and breath sounds normal. No respiratory distress. She exhibits no tenderness.  Lymphadenopathy:    She has no cervical adenopathy.  Neurological: She is alert.  Skin: Skin is warm and dry. No rash noted.  Psychiatric: She has a normal mood and affect.          Assessment & Plan:  1. Sinusitis - azithromycin (ZITHROMAX) 250 MG tablet; Take as directed  Dispense: 6 tablet; Refill: 0 -RTO if symptoms worsen or unresolved Patient verbalized understanding Coralie Keens, FNP-C

## 2013-02-06 NOTE — Telephone Encounter (Signed)
Pt wanted to be seen for sinus  Appt scheduled

## 2013-02-21 ENCOUNTER — Other Ambulatory Visit: Payer: Self-pay

## 2013-03-19 ENCOUNTER — Telehealth: Payer: Self-pay | Admitting: Oncology

## 2013-03-20 ENCOUNTER — Other Ambulatory Visit: Payer: BC Managed Care – PPO | Admitting: Lab

## 2013-03-20 ENCOUNTER — Ambulatory Visit: Payer: BC Managed Care – PPO | Admitting: Adult Health

## 2013-03-21 ENCOUNTER — Ambulatory Visit: Payer: BC Managed Care – PPO | Admitting: Physician Assistant

## 2013-03-21 ENCOUNTER — Other Ambulatory Visit: Payer: BC Managed Care – PPO | Admitting: Lab

## 2013-07-01 ENCOUNTER — Other Ambulatory Visit: Payer: Self-pay | Admitting: Adult Health

## 2013-07-01 DIAGNOSIS — R921 Mammographic calcification found on diagnostic imaging of breast: Secondary | ICD-10-CM

## 2013-07-10 ENCOUNTER — Ambulatory Visit
Admission: RE | Admit: 2013-07-10 | Discharge: 2013-07-10 | Disposition: A | Discharge status: Home / Self Care | Payer: BC Managed Care – PPO | Source: Ambulatory Visit | Attending: Adult Health | Admitting: Adult Health

## 2013-07-10 DIAGNOSIS — R921 Mammographic calcification found on diagnostic imaging of breast: Secondary | ICD-10-CM

## 2013-08-13 ENCOUNTER — Other Ambulatory Visit: Payer: Self-pay

## 2013-08-13 DIAGNOSIS — I10 Essential (primary) hypertension: Secondary | ICD-10-CM

## 2013-08-13 NOTE — Telephone Encounter (Signed)
Last seen 02/06/13  Kayla Neal

## 2013-08-16 MED ORDER — VALSARTAN 160 MG PO TABS: 160.0000 mg | ORAL_TABLET | Freq: Every day | ORAL | Status: DC

## 2013-10-23 ENCOUNTER — Other Ambulatory Visit: Payer: Self-pay | Admitting: Adult Health

## 2013-10-28 ENCOUNTER — Other Ambulatory Visit: Payer: Self-pay | Admitting: Adult Health

## 2013-10-28 DIAGNOSIS — Z853 Personal history of malignant neoplasm of breast: Secondary | ICD-10-CM

## 2013-10-31 ENCOUNTER — Other Ambulatory Visit: Payer: Self-pay | Admitting: Adult Health

## 2013-10-31 ENCOUNTER — Other Ambulatory Visit: Payer: Self-pay

## 2013-10-31 DIAGNOSIS — Z853 Personal history of malignant neoplasm of breast: Secondary | ICD-10-CM

## 2013-11-01 ENCOUNTER — Ambulatory Visit
Admission: RE | Admit: 2013-11-01 | Discharge: 2013-11-01 | Disposition: A | Discharge status: Home / Self Care | Payer: BC Managed Care – PPO | Source: Ambulatory Visit | Attending: Adult Health | Admitting: Adult Health

## 2013-11-01 DIAGNOSIS — Z853 Personal history of malignant neoplasm of breast: Secondary | ICD-10-CM

## 2013-11-05 ENCOUNTER — Other Ambulatory Visit: Payer: Self-pay | Admitting: Oncology

## 2013-11-05 DIAGNOSIS — C50919 Malignant neoplasm of unspecified site of unspecified female breast: Secondary | ICD-10-CM

## 2013-11-07 ENCOUNTER — Other Ambulatory Visit: Payer: Self-pay | Admitting: *Deleted

## 2013-11-08 ENCOUNTER — Telehealth: Payer: Self-pay | Admitting: Adult Health

## 2013-11-08 NOTE — Telephone Encounter (Signed)
lvm for pt regarding to OCT appt....mailed pt appt sched and letter °

## 2013-12-31 ENCOUNTER — Telehealth: Payer: Self-pay | Admitting: Hematology and Oncology

## 2013-12-31 NOTE — Telephone Encounter (Signed)
MOVED 10/22 APPTS TO 10/20 DUE TO LC OUT. LMONVM FOR PT AND MAILED SCHEDULE.

## 2014-01-30 ENCOUNTER — Telehealth: Payer: Self-pay | Admitting: Adult Health

## 2014-01-30 NOTE — Telephone Encounter (Signed)
, °

## 2014-02-04 ENCOUNTER — Other Ambulatory Visit: Payer: BC Managed Care – PPO

## 2014-02-04 ENCOUNTER — Ambulatory Visit: Payer: BC Managed Care – PPO | Admitting: Adult Health

## 2014-02-05 ENCOUNTER — Other Ambulatory Visit: Payer: Self-pay | Admitting: Oncology

## 2014-02-06 ENCOUNTER — Ambulatory Visit: Payer: BC Managed Care – PPO | Admitting: Adult Health

## 2014-02-06 ENCOUNTER — Other Ambulatory Visit: Payer: BC Managed Care – PPO

## 2014-02-17 ENCOUNTER — Encounter: Payer: Self-pay | Admitting: General Practice

## 2014-02-18 ENCOUNTER — Encounter: Payer: Self-pay | Admitting: Adult Health

## 2014-02-18 ENCOUNTER — Other Ambulatory Visit (HOSPITAL_BASED_OUTPATIENT_CLINIC_OR_DEPARTMENT_OTHER): Payer: BC Managed Care – PPO

## 2014-02-18 ENCOUNTER — Ambulatory Visit (HOSPITAL_BASED_OUTPATIENT_CLINIC_OR_DEPARTMENT_OTHER): Payer: BC Managed Care – PPO | Admitting: Adult Health

## 2014-02-18 VITALS — BP 137/87 | HR 87 | Temp 98.2°F | Resp 18 | Ht 69.0 in | Wt 337.4 lb

## 2014-02-18 DIAGNOSIS — C50911 Malignant neoplasm of unspecified site of right female breast: Secondary | ICD-10-CM

## 2014-02-18 DIAGNOSIS — K219 Gastro-esophageal reflux disease without esophagitis: Secondary | ICD-10-CM

## 2014-02-18 DIAGNOSIS — IMO0001 Reserved for inherently not codable concepts without codable children: Secondary | ICD-10-CM

## 2014-02-18 DIAGNOSIS — D0590 Unspecified type of carcinoma in situ of unspecified breast: Secondary | ICD-10-CM

## 2014-02-18 DIAGNOSIS — Z170 Estrogen receptor positive status [ER+]: Secondary | ICD-10-CM

## 2014-02-18 LAB — CBC WITH DIFFERENTIAL/PLATELET
BASO%: 0.4 % (ref 0.0–2.0)
Basophils Absolute: 0 10*3/uL (ref 0.0–0.1)
EOS%: 2 % (ref 0.0–7.0)
Eosinophils Absolute: 0.1 10*3/uL (ref 0.0–0.5)
HCT: 42.1 % (ref 34.8–46.6)
HGB: 13 g/dL (ref 11.6–15.9)
LYMPH%: 22.8 % (ref 14.0–49.7)
MCH: 25.3 pg (ref 25.1–34.0)
MCHC: 31 g/dL — ABNORMAL LOW (ref 31.5–36.0)
MCV: 81.7 fL (ref 79.5–101.0)
MONO#: 0.4 10*3/uL (ref 0.1–0.9)
MONO%: 6.5 % (ref 0.0–14.0)
NEUT%: 68.3 % (ref 38.4–76.8)
NEUTROS ABS: 4.7 10*3/uL (ref 1.5–6.5)
PLATELETS: 225 10*3/uL (ref 145–400)
RBC: 5.15 10*6/uL (ref 3.70–5.45)
RDW: 13.7 % (ref 11.2–14.5)
WBC: 6.9 10*3/uL (ref 3.9–10.3)
lymph#: 1.6 10*3/uL (ref 0.9–3.3)

## 2014-02-18 LAB — COMPREHENSIVE METABOLIC PANEL (CC13)
ALT: 10 U/L (ref 0–55)
AST: 13 U/L (ref 5–34)
Albumin: 3.8 g/dL (ref 3.5–5.0)
Alkaline Phosphatase: 114 U/L (ref 40–150)
Anion Gap: 10 mEq/L (ref 3–11)
BUN: 13.6 mg/dL (ref 7.0–26.0)
CHLORIDE: 102 meq/L (ref 98–109)
CO2: 27 mEq/L (ref 22–29)
Calcium: 9.9 mg/dL (ref 8.4–10.4)
Creatinine: 1 mg/dL (ref 0.6–1.1)
GLUCOSE: 105 mg/dL (ref 70–140)
POTASSIUM: 4 meq/L (ref 3.5–5.1)
SODIUM: 140 meq/L (ref 136–145)
Total Bilirubin: 0.43 mg/dL (ref 0.20–1.20)
Total Protein: 7.5 g/dL (ref 6.4–8.3)

## 2014-02-18 MED ORDER — OMEPRAZOLE 40 MG PO CPDR: 40.0000 mg | DELAYED_RELEASE_CAPSULE | Freq: Every day | ORAL | Status: DC

## 2014-02-18 NOTE — Patient Instructions (Signed)
You are doing well.  You sign of recurrence.  I recommend healthy diet, exercise, and monthly breast exams.    Breast Self-Awareness Practicing breast self-awareness may pick up problems early, prevent significant medical complications, and possibly save your life. By practicing breast self-awareness, you can become familiar with how your breasts look and feel and if your breasts are changing. This allows you to notice changes early. It can also offer you some reassurance that your breast health is good. One way to learn what is normal for your breasts and whether your breasts are changing is to do a breast self-exam. If you find a lump or something that was not present in the past, it is best to contact your caregiver right away. Other findings that should be evaluated by your caregiver include nipple discharge, especially if it is bloody; skin changes or reddening; areas where the skin seems to be pulled in (retracted); or new lumps and bumps. Breast pain is seldom associated with cancer (malignancy), but should also be evaluated by a caregiver. HOW TO PERFORM A BREAST SELF-EXAM The best time to examine your breasts is 5-7 days after your menstrual period is over. During menstruation, the breasts are lumpier, and it may be more difficult to pick up changes. If you do not menstruate, have reached menopause, or had your uterus removed (hysterectomy), you should examine your breasts at regular intervals, such as monthly. If you are breastfeeding, examine your breasts after a feeding or after using a breast pump. Breast implants do not decrease the risk for lumps or tumors, so continue to perform breast self-exams as recommended. Talk to your caregiver about how to determine the difference between the implant and breast tissue. Also, talk about the amount of pressure you should use during the exam. Over time, you will become more familiar with the variations of your breasts and more comfortable with the exam. A  breast self-exam requires you to remove all your clothes above the waist. 1. Look at your breasts and nipples. Stand in front of a mirror in a room with good lighting. With your hands on your hips, push your hands firmly downward. Look for a difference in shape, contour, and size from one breast to the other (asymmetry). Asymmetry includes puckers, dips, or bumps. Also, look for skin changes, such as reddened or scaly areas on the breasts. Look for nipple changes, such as discharge, dimpling, repositioning, or redness. 2. Carefully feel your breasts. This is best done either in the shower or tub while using soapy water or when flat on your back. Place the arm (on the side of the breast you are examining) above your head. Use the pads (not the fingertips) of your three middle fingers on your opposite hand to feel your breasts. Start in the underarm area and use  inch (2 cm) overlapping circles to feel your breast. Use 3 different levels of pressure (light, medium, and firm pressure) at each circle before moving to the next circle. The light pressure is needed to feel the tissue closest to the skin. The medium pressure will help to feel breast tissue a little deeper, while the firm pressure is needed to feel the tissue close to the ribs. Continue the overlapping circles, moving downward over the breast until you feel your ribs below your breast. Then, move one finger-width towards the center of the body. Continue to use the  inch (2 cm) overlapping circles to feel your breast as you move slowly up toward the collar  bone (clavicle) near the base of the neck. Continue the up and down exam using all 3 pressures until you reach the middle of the chest. Do this with each breast, carefully feeling for lumps or changes. 3.  Keep a written record with breast changes or normal findings for each breast. By writing this information down, you do not need to depend only on memory for size, tenderness, or location. Write down  where you are in your menstrual cycle, if you are still menstruating. Breast tissue can have some lumps or thick tissue. However, see your caregiver if you find anything that concerns you.  SEEK MEDICAL CARE IF:  You see a change in shape, contour, or size of your breasts or nipples.   You see skin changes, such as reddened or scaly areas on the breasts or nipples.   You have an unusual discharge from your nipples.   You feel a new lump or unusually thick areas.  Document Released: 04/04/2005 Document Revised: 03/21/2012 Document Reviewed: 07/20/2011 [facility] Patient Information 2015 [place], [place]. This information is not intended to replace advice given to you by your health care provider. Make sure you discuss any questions you have with your health care provider.  Omeprazole tablets (OTC) What is this medicine? OMEPRAZOLE (oh ME pray zol) prevents the production of acid in the stomach. It is used to treat the symptoms of heartburn. You can buy this medicine without a prescription. This product is not for long-term use, unless otherwise directed by your doctor or health care professional. This medicine may be used for other purposes; ask your health care provider or pharmacist if you have questions. COMMON BRAND NAME(S): Prilosec OTC What should I tell my health care provider before I take this medicine? They need to know if you have any of these conditions: -black or bloody stools -chest pain -difficulty swallowing -have had heartburn for over 3 months -have heartburn with dizziness, lightheadedness or sweating -liver disease -stomach pain -unexplained weight loss -vomiting with blood -wheezing -an unusual or allergic reaction to omeprazole, other medicines, foods, dyes, or preservatives -pregnant or trying to get pregnant -breast-feeding How should I use this medicine? Take this medicine by mouth. Follow the directions on the product label. If you are taking this  medicine without a prescription, take one tablet every day. Do not use for longer than 14 days or repeat a course of treatment more often than every 4 months unless directed by a doctor or healthcare professional. Take your dose at regular intervals every 24 hours. Swallow the tablet whole with a drink of water. Do not crush, break or chew. This medicine works best if taken on an empty stomach 30 minutes before breakfast. If you are using this medicine with the prescription of your doctor or healthcare professional, follow the directions you were given. Do not take your medicine more often than directed. Talk to your pediatrician regarding the use of this medicine in children. Special care may be needed. Overdosage: If you think you have taken too much of this medicine contact a poison control center or emergency room at once. NOTE: This medicine is only for you. Do not share this medicine with others. What if I miss a dose? If you miss a dose, take it as soon as you can. If it is almost time for your next dose, take only that dose. Do not take double or extra doses. What may interact with this medicine? Do not take this medicine with any of the  following medications: -atazanavir -clopidogrel -nelfinavir This medicine may also interact with the following medications: -ampicillin -certain medicines for anxiety or sleep -certain medicines that treat or prevent blood clots like warfarin -cyclosporine -diazepam -digoxin -disulfiram -iron salts -phenytoin -prescription medicine for fungal or yeast infection like itraconazole, ketoconazole, voriconazole -saquinavir -tacrolimus This list may not describe all possible interactions. Give your health care provider a list of all the medicines, herbs, non-prescription drugs, or dietary supplements you use. Also tell them if you smoke, drink alcohol, or use illegal drugs. Some items may interact with your medicine. What should I watch for while using  this medicine? It can take several days before your heartburn gets better. Check with your doctor or health care professional if your condition does not start to get better, or if it gets worse. Do not treat diarrhea with over the counter products. Contact your doctor if you have diarrhea that lasts more than 2 days or if it is severe and watery. Do not treat yourself for heartburn with this medicine for more than 14 days in a row. You should only use this medicine for a 2-week treatment period once every 4 months. If your symptoms return shortly after your therapy is complete, or within the 4 month time frame, call your doctor or health care professional. What side effects may I notice from receiving this medicine? Side effects that you should report to your doctor or health care professional as soon as possible: -allergic reactions like skin rash, itching or hives, swelling of the face, lips, or tongue -bone, muscle or joint pain -breathing problems -chest pain or chest tightness -dark yellow or brown urine -diarrhea -dizziness -fast, irregular heartbeat -feeling faint or lightheaded -fever or sore throat -muscle spasm -palpitations -redness, blistering, peeling or loosening of the skin, including inside the mouth -seizures -tremors -unusual bleeding or bruising -unusually weak or tired -yellowing of the eyes or skin Side effects that usually do not require medical attention (Report these to your doctor or health care professional if they continue or are bothersome.): -constipation -dry mouth -headache -loose stools -nausea This list may not describe all possible side effects. Call your doctor for medical advice about side effects. You may report side effects to FDA at 1-800-FDA-1088. Where should I keep my medicine? Keep out of the reach of children. Store at room temperature between 20 and 25 degrees C (68 and 77 degrees F). Protect from light and moisture. Throw away any unused  medicine after the expiration date. NOTE: This sheet is a summary. It may not cover all possible information. If you have questions about this medicine, talk to your doctor, pharmacist, or health care provider.  2015, [employer]. (2011-01-03 11:40:25)

## 2014-02-18 NOTE — Progress Notes (Signed)
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 She underwent radiation therapy in [place] under the care of Dr. Orlene Erm from 10/28 to 04/19/12.    3. Tamoxifen 20mg  daily started on 05/28/12  CURRENT THERAPY:daily Tamoxifen   INTERVAL HISTORY: Kayla Neal 53 y.o. female returns for follow up visit. She is doing moderately well today.  She is taking Tamoxifen daily.  She did take a hiatus from the medication over the summer stating that she just wanted to feel well.  She is taking it daily again.  She does have joint aches and takes diclofenac for this.  She has hot flashes that she deals with.  She is having reflux and wants to know what to take for it.  She is reporting it daily.  She cannot recall any specific pattern.  She denies any new pain, bowel/bladder changes, fevers, chills, nausea, vomiting, or any further concerns.   MEDICAL HISTORY: Past Medical History  Diagnosis Date  . Nasal congestion   . Wheezing     hx  - at allergy season only  . Hypertension     under control without medication as of 11/11/11-no meds for  at least 1 yr  . Asthma     seasonal allergies related-prn albuterol  . GERD (gastroesophageal reflux disease)     no meds required in past 3 years  . Arthritis     knee pain-uses acetaminophen for pain control prn  . Cervical polyp   . 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    . Allergy     LISINOPRIL=N,V  . Anemia 10/18/2011    placed on iron tid for Hgb of 8  . Fibroids     MULTIPLE IN ENDOMETRIAL CAVITY  . Uterine bleeding, dysfunctional     severe  . Submucous myoma of uterus   . Use of megestrol acetate (Megace)     For uterine bleeding    ALLERGIES:  is allergic to lisinopril; bee pollen; and  oxycodone.  MEDICATIONS:  Current Outpatient Prescriptions  Medication Sig Dispense Refill  . albuterol (PROVENTIL HFA;VENTOLIN HFA) 108 (90 BASE) MCG/ACT inhaler Inhale 1 puff into the lungs daily as needed. For asthma symptoms 1 Inhaler 6  . diclofenac (VOLTAREN) 75 MG EC tablet Take 75 mg by mouth daily.    Marland Kitchen EVENING PRIMROSE OIL PO Take by mouth daily as needed.     . Multiple Vitamins-Minerals (ONE-A-DAY MENOPAUSE FORMULA PO) Take 1 tablet by mouth daily.     . naproxen sodium (ANAPROX) 220 MG tablet Take 220 mg by mouth as needed.    . tamoxifen (NOLVADEX) 20 MG tablet TAKE ONE TABLET BY MOUTH ONCE DAILY 30 tablet 0  . EPINEPHrine (EPI-PEN) 0.3 mg/0.3 mL DEVI Inject 0.3 mLs (0.3 mg total) into the muscle once. 1 Device 1  . valsartan (DIOVAN) 160 MG tablet Take 1 tablet (160 mg total) by mouth daily. 30 tablet 0   No current facility-administered medications for this visit.    SURGICAL HISTORY:  Past Surgical History  Procedure Laterality Date  . Tooth extraction  2000  . Breast biopsy  11/14/2011    Procedure: BREAST BIOPSY WITH NEEDLE LOCALIZATION;  Surgeon: Haywood Lasso, MD;  Location: [facility];  Service: General;  Laterality: Right;  needle localization right breast calcifications  . Breast lumpectomy  11/14/11    RIGHT  BREAST=DUCTAL CA IN SITU,W/CALCIFICATIONS,HIGH GRADE,2.4CM,SURGICAL RESECTION MARGINS NEG,  DR. C. STRECK  . Endometrial biopsy  12/09/11    UTERUS=BENIGN ENDOMETRIAL GLANDS,BENIGN SQUAMOUS EPITHELIAL CELLS,NO DYSPLASIA OR MALIGNANCY  . Cervix polyp  10/17/11    NO MALIGNANCY  . Tonsillectomy    . Vaginal hysterectomy  01/26/2012    Procedure: HYSTERECTOMY VAGINAL;  Surgeon: Bennetta Laos, MD;  Location: [facility];  Service: Gynecology;  Laterality: N/A;  . Vaginal hysterectomy      REVIEW OF SYSTEMS:   A 10 point review of systems was conducted and is otherwise negative except for what is noted above.    Health  Maintenance Mammogram: 10/2013 Colonoscopy: never, declined referral Bone Density Scan: never Pap Smear: s/p TAH Eye Exam: 08/2013 Vitamin D Level: 2012 Lipid Panel:2014  PHYSICAL EXAMINATION: Blood pressure 137/87, pulse 87, temperature 98.2 F (36.8 C), temperature source Oral, resp. rate 18, height 5\' 9"  (1.753 m), weight 337 lb 6.4 oz (153.044 kg). Body mass index is 49.8 kg/(m^2). GENERAL: Patient is a well appearing female in no acute distress HEENT:  Sclerae anicteric.  Oropharynx clear and moist. No ulcerations or evidence of oropharyngeal candidiasis. Neck is supple.  NODES:  No cervical, supraclavicular, or axillary lymphadenopathy palpated.  BREAST EXAM: LUNGS:  Clear to auscultation bilaterally.  No wheezes or rhonchi. HEART:  Regular rate and rhythm. No murmur appreciated. ABDOMEN:  Soft, nontender.  Positive, normoactive bowel sounds. No organomegaly palpated. MSK:  No focal spinal tenderness to palpation. Full range of motion bilaterally in the upper extremities. EXTREMITIES:  No peripheral edema.   SKIN:  Clear with no obvious rashes or skin changes. No nail dyscrasia. NEURO:  Nonfocal. Well oriented.  Appropriate affect. ECOG PERFORMANCE STATUS: 0 - Asymptomatic   LABORATORY DATA: Lab Results  Component Value Date   WBC 6.9 02/18/2014   HGB 13.0 02/18/2014   HCT 42.1 02/18/2014   MCV 81.7 02/18/2014   PLT 225 02/18/2014      Chemistry      Component Value Date/Time   NA 138 09/17/2012 1455   NA 137 01/23/2012 0929   K 3.7 09/17/2012 1455   K 4.9 01/23/2012 0929   CL 104 09/17/2012 1455   CL 101 01/23/2012 0929   CO2 28 09/17/2012 1455   CO2 26 01/23/2012 0929   BUN 12.7 09/17/2012 1455   BUN 10 01/23/2012 0929   CREATININE 0.8 09/17/2012 1455   CREATININE 0.89 01/23/2012 0929      Component Value Date/Time   CALCIUM 9.0 09/17/2012 1455   CALCIUM 9.7 01/23/2012 0929   ALKPHOS 87 09/17/2012 1455   AST 17 09/17/2012 1455   ALT 11 09/17/2012 1455    BILITOT 0.35 09/17/2012 1455       RADIOGRAPHIC STUDIES:  No results found.  ASSESSMENT: 53 year old female with  1. 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 She is doing well post operatively.  She completed radiation therapy, and is takingTamoxifen 20mg  daily for 5 years.     PLAN: Kayla Neal is doing well today. Her CBC is normal and I reviewed this with her in detail.  She has no sign of recurrence.  She will continue tamoxifen daily.  She will continue diclofenac if needed for aches and pain.    I prescribed Omeprazole for her reflux daily.  Should it continue to  be a problem, she should consider referral to GI.    I recommended healthy diet, exercise and monthly breast exam.   Kayla Neal will return in 6 months to f/u with Dr. Lindi Adie.     All questions were answered. The patient knows to call the clinic with any problems, questions or concerns. We can certainly see the patient much sooner if necessary.  I spent 25 minutes counseling the patient face to face. The total time spent in the appointment was 30 minutes.  Minette Headland, [place] [phone]

## 2014-02-19 ENCOUNTER — Telehealth: Payer: Self-pay | Admitting: Oncology

## 2014-02-19 NOTE — Telephone Encounter (Signed)
, °

## 2014-03-12 ENCOUNTER — Other Ambulatory Visit: Payer: Self-pay | Admitting: Oncology

## 2014-03-12 DIAGNOSIS — C50911 Malignant neoplasm of unspecified site of right female breast: Secondary | ICD-10-CM

## 2014-04-04 ENCOUNTER — Other Ambulatory Visit: Payer: Self-pay | Admitting: General Practice

## 2014-08-19 ENCOUNTER — Ambulatory Visit: Payer: BC Managed Care – PPO | Admitting: Hematology and Oncology

## 2014-08-19 ENCOUNTER — Other Ambulatory Visit: Payer: BC Managed Care – PPO

## 2014-09-19 ENCOUNTER — Telehealth: Payer: Self-pay | Admitting: Hematology and Oncology

## 2014-09-19 NOTE — Telephone Encounter (Signed)
Pt lft msg on 06/01 for c/b, s/w pt confirming r/s missed visit for labs/ov .... KJ

## 2014-09-24 ENCOUNTER — Telehealth: Payer: Self-pay | Admitting: *Deleted

## 2014-09-24 ENCOUNTER — Other Ambulatory Visit: Payer: Self-pay | Admitting: *Deleted

## 2014-09-24 DIAGNOSIS — C50911 Malignant neoplasm of unspecified site of right female breast: Secondary | ICD-10-CM

## 2014-09-24 MED ORDER — TAMOXIFEN CITRATE 20 MG PO TABS: 20.0000 mg | ORAL_TABLET | Freq: Every day | ORAL | Status: DC

## 2014-09-24 NOTE — Telephone Encounter (Signed)
Patient called requesting "refill for tamoxifen.  Pharmacy has tried to obtain refill unsuccessfully and instructed me to call because Dr. Humphrey Rolls and Mendel Ryder are no longer there."  Informed patient she will see Gentry Fitz 10-02-2014 and refill will be placed under today's on-call provider because she hasn't been seen yet by newly assigned providers.  Verified Pharmacy.

## 2014-09-25 ENCOUNTER — Other Ambulatory Visit: Payer: Self-pay | Admitting: *Deleted

## 2014-09-25 DIAGNOSIS — C50911 Malignant neoplasm of unspecified site of right female breast: Secondary | ICD-10-CM

## 2014-09-25 MED ORDER — TAMOXIFEN CITRATE 20 MG PO TABS: 20.0000 mg | ORAL_TABLET | Freq: Every day | ORAL | Status: DC

## 2014-09-25 NOTE — Telephone Encounter (Signed)
Fax received from pharmacy in [place] for Tamoxifen refill.  Yesterday patient requested refill go to [place].  Re-sent order to pharmacy today.

## 2014-10-01 ENCOUNTER — Other Ambulatory Visit: Payer: Self-pay | Admitting: *Deleted

## 2014-10-01 DIAGNOSIS — C50219 Malignant neoplasm of upper-inner quadrant of unspecified female breast: Secondary | ICD-10-CM

## 2014-10-01 DIAGNOSIS — C50911 Malignant neoplasm of unspecified site of right female breast: Secondary | ICD-10-CM

## 2014-10-02 ENCOUNTER — Ambulatory Visit: Payer: BC Managed Care – PPO | Admitting: Nurse Practitioner

## 2014-10-02 ENCOUNTER — Telehealth: Payer: Self-pay | Admitting: *Deleted

## 2014-10-02 ENCOUNTER — Other Ambulatory Visit: Payer: BC Managed Care – PPO

## 2014-10-02 NOTE — Telephone Encounter (Signed)
Called to inquire about pt's missed appt today. Pt had an emergency meeting at work today and lasted longer than what she anticipated. I told pt we will get her r/s  for this appt. Pt will call us tomorrow to let us know what a good day will be for her as her schedule has changed for the summer. Message to be forwarded to [facility].

## 2014-10-06 ENCOUNTER — Telehealth: Payer: Self-pay | Admitting: *Deleted

## 2014-10-06 ENCOUNTER — Other Ambulatory Visit: Payer: Self-pay | Admitting: *Deleted

## 2014-10-06 NOTE — Telephone Encounter (Signed)
Called to reschedule follow-up appt. Pt was not able to come to appt last week 10/02/14 b/c of an emergency meeting at work. Pt informed me that her work schedule will be changing for the summer and she would call  to let me know what days would work for her to be r/s. I have not heard from pt, this is my 2nd attempt to reach her. Left a message for pt to call this nurse back. Message to be forwarded Gentry Fitz, NP.

## 2014-10-24 ENCOUNTER — Other Ambulatory Visit: Payer: Self-pay | Admitting: *Deleted

## 2014-10-24 ENCOUNTER — Telehealth: Payer: Self-pay | Admitting: Hematology and Oncology

## 2014-10-24 DIAGNOSIS — C50911 Malignant neoplasm of unspecified site of right female breast: Secondary | ICD-10-CM

## 2014-10-24 MED ORDER — TAMOXIFEN CITRATE 20 MG PO TABS: 20.0000 mg | ORAL_TABLET | Freq: Every day | ORAL | Status: DC

## 2014-10-24 NOTE — Telephone Encounter (Signed)
pt called to r/s appt...done....pt ok and aware °

## 2014-11-04 ENCOUNTER — Other Ambulatory Visit: Payer: Self-pay

## 2014-11-04 ENCOUNTER — Other Ambulatory Visit: Payer: Self-pay | Admitting: Hematology and Oncology

## 2014-11-04 DIAGNOSIS — Z853 Personal history of malignant neoplasm of breast: Secondary | ICD-10-CM

## 2014-11-07 ENCOUNTER — Ambulatory Visit
Admission: RE | Admit: 2014-11-07 | Discharge: 2014-11-07 | Disposition: A | Discharge status: Home / Self Care | Payer: BC Managed Care – PPO | Source: Ambulatory Visit | Attending: Hematology and Oncology | Admitting: Hematology and Oncology

## 2014-11-07 DIAGNOSIS — Z853 Personal history of malignant neoplasm of breast: Secondary | ICD-10-CM

## 2014-11-12 ENCOUNTER — Other Ambulatory Visit: Payer: BC Managed Care – PPO

## 2014-11-12 ENCOUNTER — Telehealth: Payer: Self-pay | Admitting: Nurse Practitioner

## 2014-11-12 ENCOUNTER — Telehealth: Payer: Self-pay

## 2014-11-12 ENCOUNTER — Ambulatory Visit: Payer: BC Managed Care – PPO | Admitting: Nurse Practitioner

## 2014-11-12 NOTE — Telephone Encounter (Signed)
Called patient back and rescheduled her appointment per her request

## 2014-11-12 NOTE — Telephone Encounter (Signed)
Order faxed to [facility] for mammo/US.  Sent to scan.

## 2014-12-02 ENCOUNTER — Ambulatory Visit: Payer: BC Managed Care – PPO | Admitting: Nurse Practitioner

## 2014-12-02 ENCOUNTER — Telehealth: Payer: Self-pay | Admitting: Nurse Practitioner

## 2014-12-02 ENCOUNTER — Other Ambulatory Visit: Payer: BC Managed Care – PPO

## 2014-12-02 NOTE — Telephone Encounter (Signed)
Pat called in to cancel todays appointment due to being ill and i have left her a message with a new date and time

## 2014-12-17 ENCOUNTER — Other Ambulatory Visit: Payer: BC Managed Care – PPO

## 2014-12-17 ENCOUNTER — Ambulatory Visit: Payer: BC Managed Care – PPO | Admitting: Nurse Practitioner

## 2014-12-17 ENCOUNTER — Telehealth: Payer: Self-pay | Admitting: Nurse Practitioner

## 2014-12-17 ENCOUNTER — Other Ambulatory Visit: Payer: Self-pay | Admitting: Nurse Practitioner

## 2014-12-17 NOTE — Telephone Encounter (Signed)
Called and left a message as the patient had left a vm to reschedule todays appointments

## 2014-12-18 ENCOUNTER — Telehealth: Payer: Self-pay | Admitting: Hematology and Oncology

## 2014-12-18 NOTE — Telephone Encounter (Signed)
Left message to confirm appointment change from APP to MD on 09/16.

## 2014-12-31 ENCOUNTER — Telehealth: Payer: Self-pay | Admitting: Hematology and Oncology

## 2014-12-31 NOTE — Telephone Encounter (Signed)
Returned Advertising account executive. Left message moving appointment per patient needing late day due to drive. Moved from 09/16 to 09/21.

## 2015-01-02 ENCOUNTER — Ambulatory Visit: Payer: BC Managed Care – PPO | Admitting: Hematology and Oncology

## 2015-01-02 ENCOUNTER — Other Ambulatory Visit: Payer: BC Managed Care – PPO

## 2015-01-02 ENCOUNTER — Ambulatory Visit: Payer: BC Managed Care – PPO | Admitting: Nurse Practitioner

## 2015-01-06 ENCOUNTER — Telehealth: Payer: Self-pay | Admitting: Hematology and Oncology

## 2015-01-06 NOTE — Telephone Encounter (Signed)
Returned patients call and resscheduled her appointment

## 2015-01-07 ENCOUNTER — Other Ambulatory Visit: Payer: BC Managed Care – PPO

## 2015-01-07 ENCOUNTER — Ambulatory Visit: Payer: BC Managed Care – PPO | Admitting: Hematology and Oncology

## 2015-02-02 ENCOUNTER — Encounter: Payer: Self-pay | Admitting: Family Medicine

## 2015-02-02 ENCOUNTER — Ambulatory Visit (INDEPENDENT_AMBULATORY_CARE_PROVIDER_SITE_OTHER): Payer: BC Managed Care – PPO | Admitting: Family Medicine

## 2015-02-02 VITALS — BP 139/89 | HR 84 | Temp 98.0°F | Ht 69.0 in | Wt 336.6 lb

## 2015-02-02 DIAGNOSIS — I10 Essential (primary) hypertension: Secondary | ICD-10-CM

## 2015-02-02 DIAGNOSIS — J309 Allergic rhinitis, unspecified: Secondary | ICD-10-CM | POA: Diagnosis not present

## 2015-02-02 MED ORDER — BENZONATATE 100 MG PO CAPS: 100.0000 mg | ORAL_CAPSULE | Freq: Two times a day (BID) | ORAL | Status: DC | PRN

## 2015-02-02 MED ORDER — VALSARTAN 160 MG PO TABS: 160.0000 mg | ORAL_TABLET | Freq: Every day | ORAL | Status: DC

## 2015-02-02 MED ORDER — FLUTICASONE PROPIONATE 50 MCG/ACT NA SUSP: 2.0000 | Freq: Every day | NASAL | Status: DC

## 2015-02-02 NOTE — Progress Notes (Signed)
BP 139/89 mmHg  Pulse 84  Temp(Src) 98 F (36.7 C) (Oral)  Ht 5\' 9"  (1.753 m)  Wt 336 lb 9.6 oz (152.681 kg)  BMI 49.68 kg/m2   Subjective:    Patient ID: Kayla Neal, female    DOB: 07/28/1960, 54 y.o.   MRN: [id number]  HPI: Kayla Neal is a 54 y.o. female presenting on 02/02/2015 for Cough; Chest congestion; and Nasal Congestion   HPI Cough and wheezing Patient presents today with a 3 week history of cough and congestion and nasal drainage and postnasal drip. She does get seasonal allergies. It is not gotten better or worse over the past 3 weeks. She does complain of some clear sinus drainage over this time. She denies any fevers or chills. Her cough is nonproductive. She has been using her albuterol asthma inhaler but doesn't feel like it's really help much. She denies any shortness of breath except during coughing spells.  Relevant past medical, surgical, family and social history reviewed and updated as indicated. Interim medical history since our last visit reviewed. Allergies and medications reviewed and updated.  Review of Systems  Constitutional: Negative for fever and chills.  HENT: Positive for postnasal drip, rhinorrhea, sinus pressure, sneezing and sore throat. Negative for congestion, ear discharge and ear pain.   Eyes: Negative for pain, redness and visual disturbance.  Respiratory: Positive for cough and wheezing. Negative for chest tightness and shortness of breath.   Cardiovascular: Negative for chest pain and leg swelling.  Genitourinary: Negative for dysuria and difficulty urinating.  Musculoskeletal: Negative for back pain and gait problem.  Skin: Negative for rash.  Neurological: Negative for light-headedness and headaches.  Psychiatric/Behavioral: Negative for behavioral problems and agitation.  All other systems reviewed and are negative.   Per HPI unless specifically indicated above     Medication List       This list is accurate as of:  02/02/15  5:40 PM.  Always use your most recent med list.               benzonatate 100 MG capsule  Commonly known as:  TESSALON  Take 1 capsule (100 mg total) by mouth 2 (two) times daily as needed for cough.     EPINEPHrine 0.3 mg/0.3 mL Soaj injection  Commonly known as:  EPI-PEN  Inject 0.3 mLs (0.3 mg total) into the muscle once.     EVENING PRIMROSE OIL PO  Take by mouth daily as needed.     fluticasone 50 MCG/ACT nasal spray  Commonly known as:  FLONASE  Place 2 sprays into both nostrils daily.     naproxen sodium 220 MG tablet  Commonly known as:  ANAPROX  Take 220 mg by mouth as needed.     ONE-A-DAY MENOPAUSE FORMULA PO  Take 1 tablet by mouth daily.     PROVENTIL HFA 108 (90 BASE) MCG/ACT inhaler  Generic drug:  albuterol  INHALE ONE PUFF BY MOUTH  DAILY AS NEEDED FOR  ASTHMA  SYMPTOMS     tamoxifen 20 MG tablet  Commonly known as:  NOLVADEX  Take 1 tablet (20 mg total) by mouth daily.     valsartan 160 MG tablet  Commonly known as:  DIOVAN  Take 1 tablet (160 mg total) by mouth daily.           Objective:    BP 139/89 mmHg  Pulse 84  Temp(Src) 98 F (36.7 C) (Oral)  Ht 5\' 9"  (1.753 m)  Wt  336 lb 9.6 oz (152.681 kg)  BMI 49.68 kg/m2  Wt Readings from Last 3 Encounters:  02/02/15 336 lb 9.6 oz (152.681 kg)  02/18/14 337 lb 6.4 oz (153.044 kg)  02/06/13 339 lb (153.769 kg)    Physical Exam  Constitutional: She is oriented to person, place, and time. She appears well-developed and well-nourished. No distress.  HENT:  Right Ear: Tympanic membrane, external ear and ear canal normal.  Left Ear: Tympanic membrane, external ear and ear canal normal.  Nose: Mucosal edema and rhinorrhea present. No epistaxis. Right sinus exhibits maxillary sinus tenderness and frontal sinus tenderness. Left sinus exhibits maxillary sinus tenderness and frontal sinus tenderness.  Mouth/Throat: Uvula is midline and mucous membranes are normal. Posterior oropharyngeal  edema present. No oropharyngeal exudate, posterior oropharyngeal erythema or tonsillar abscesses.  Eyes: Conjunctivae and EOM are normal. Pupils are equal, round, and reactive to light.  Cardiovascular: Normal rate, regular rhythm, normal heart sounds and intact distal pulses.   No murmur heard. Pulmonary/Chest: Effort normal and breath sounds normal. No respiratory distress. She has no wheezes.  Musculoskeletal: Normal range of motion. She exhibits no edema or tenderness.  Neurological: She is alert and oriented to person, place, and time. Coordination normal.  Skin: Skin is warm and dry. No rash noted. She is not diaphoretic.  Psychiatric: She has a normal mood and affect. Her behavior is normal.  Vitals reviewed.   Results for orders placed or performed in visit on 02/18/14  CBC with Differential  Result Value Ref Range   WBC 6.9 3.9 - 10.3 10e3/uL   NEUT# 4.7 1.5 - 6.5 10e3/uL   HGB 13.0 11.6 - 15.9 g/dL   HCT 42.1 34.8 - 46.6 %   Platelets 225 145 - 400 10e3/uL   MCV 81.7 79.5 - 101.0 fL   MCH 25.3 25.1 - 34.0 pg   MCHC 31.0 (L) 31.5 - 36.0 g/dL   RBC 5.15 3.70 - 5.45 10e6/uL   RDW 13.7 11.2 - 14.5 %   lymph# 1.6 0.9 - 3.3 10e3/uL   MONO# 0.4 0.1 - 0.9 10e3/uL   Eosinophils Absolute 0.1 0.0 - 0.5 10e3/uL   Basophils Absolute 0.0 0.0 - 0.1 10e3/uL   NEUT% 68.3 38.4 - 76.8 %   LYMPH% 22.8 14.0 - 49.7 %   MONO% 6.5 0.0 - 14.0 %   EOS% 2.0 0.0 - 7.0 %   BASO% 0.4 0.0 - 2.0 %  Comprehensive metabolic panel (Cmet) - [facility]  Result Value Ref Range   Sodium 140 136 - 145 mEq/L   Potassium 4.0 3.5 - 5.1 mEq/L   Chloride 102 98 - 109 mEq/L   CO2 27 22 - 29 mEq/L   Glucose 105 70 - 140 mg/dl   BUN 13.6 7.0 - 26.0 mg/dL   Creatinine 1.0 0.6 - 1.1 mg/dL   Total Bilirubin 0.43 0.20 - 1.20 mg/dL   Alkaline Phosphatase 114 40 - 150 U/L   AST 13 5 - 34 U/L   ALT 10 0 - 55 U/L   Total Protein 7.5 6.4 - 8.3 g/dL   Albumin 3.8 3.5 - 5.0 g/dL   Calcium 9.9 8.4 - 10.4 mg/dL   Anion Gap  10 3 - 11 mEq/L      Assessment & Plan:   Problem List Items Addressed This Visit      Cardiovascular and Mediastinum   Essential hypertension - Primary    Refill medication, BP not out of control despite being off medication for a  week.      Relevant Medications   valsartan (DIOVAN) 160 MG tablet    Other Visit Diagnoses    Allergic rhinitis, unspecified allergic rhinitis type        Appears to be seasonal allergies with clear drainage. Recommended antihistamine, nasal steroid, and also sent Tessalon Perles    Relevant Medications    fluticasone (FLONASE) 50 MCG/ACT nasal spray    benzonatate (TESSALON) 100 MG capsule        Follow up plan: Return in about 4 weeks (around 03/02/2015), or if symptoms worsen or fail to improve, for recheck asthma.  Caryl Pina, MD [facility] 02/02/2015, 5:40 PM

## 2015-02-02 NOTE — Assessment & Plan Note (Signed)
Refill medication, BP not out of control despite being off medication for a week.

## 2015-02-09 NOTE — Assessment & Plan Note (Signed)
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 Breast exam 02/10/15: Normal 2. Mammogram 11/07/14 No abnormalities. Postsurgical changes. Breast Density Category A. I recommended that she get 3-D mammograms for surveillance. Discussed the differences between different breast density categories.  RTC in 1 year

## 2015-02-10 ENCOUNTER — Encounter: Payer: Self-pay | Admitting: Hematology and Oncology

## 2015-02-10 ENCOUNTER — Telehealth: Payer: Self-pay | Admitting: Hematology and Oncology

## 2015-02-10 ENCOUNTER — Other Ambulatory Visit (HOSPITAL_BASED_OUTPATIENT_CLINIC_OR_DEPARTMENT_OTHER): Payer: BC Managed Care – PPO

## 2015-02-10 ENCOUNTER — Ambulatory Visit (HOSPITAL_BASED_OUTPATIENT_CLINIC_OR_DEPARTMENT_OTHER): Payer: BC Managed Care – PPO | Admitting: Hematology and Oncology

## 2015-02-10 VITALS — BP 151/69 | HR 68 | Temp 97.6°F | Resp 18 | Ht 69.0 in | Wt 339.0 lb

## 2015-02-10 DIAGNOSIS — D0511 Intraductal carcinoma in situ of right breast: Secondary | ICD-10-CM | POA: Diagnosis not present

## 2015-02-10 DIAGNOSIS — C50219 Malignant neoplasm of upper-inner quadrant of unspecified female breast: Secondary | ICD-10-CM

## 2015-02-10 DIAGNOSIS — C50211 Malignant neoplasm of upper-inner quadrant of right female breast: Secondary | ICD-10-CM

## 2015-02-10 DIAGNOSIS — Z79811 Long term (current) use of aromatase inhibitors: Secondary | ICD-10-CM

## 2015-02-10 DIAGNOSIS — C50911 Malignant neoplasm of unspecified site of right female breast: Secondary | ICD-10-CM

## 2015-02-10 LAB — COMPREHENSIVE METABOLIC PANEL (CC13)
ALT: 10 U/L (ref 0–55)
AST: 14 U/L (ref 5–34)
Albumin: 3.6 g/dL (ref 3.5–5.0)
Alkaline Phosphatase: 120 U/L (ref 40–150)
Anion Gap: 7 mEq/L (ref 3–11)
BUN: 11.1 mg/dL (ref 7.0–26.0)
CALCIUM: 9.2 mg/dL (ref 8.4–10.4)
CHLORIDE: 106 meq/L (ref 98–109)
CO2: 26 mEq/L (ref 22–29)
Creatinine: 0.8 mg/dL (ref 0.6–1.1)
EGFR: >90 mL/min/{1.73_m2} (ref >90)
Glucose: 80 mg/dl (ref 70–140)
Potassium: 4 mEq/L (ref 3.5–5.1)
Sodium: 139 mEq/L (ref 136–145)
Total Bilirubin: 0.31 mg/dL (ref 0.20–1.20)
Total Protein: 7.1 g/dL (ref 6.4–8.3)

## 2015-02-10 LAB — CBC WITH DIFFERENTIAL/PLATELET
BASO%: 0.4 % (ref 0.0–2.0)
Basophils Absolute: 0 10*3/uL (ref 0.0–0.1)
EOS ABS: 0.2 10*3/uL (ref 0.0–0.5)
EOS%: 3.5 % (ref 0.0–7.0)
HCT: 38.9 % (ref 34.8–46.6)
HEMOGLOBIN: 12.4 g/dL (ref 11.6–15.9)
LYMPH#: 1.4 10*3/uL (ref 0.9–3.3)
LYMPH%: 26.5 % (ref 14.0–49.7)
MCH: 26 pg (ref 25.1–34.0)
MCHC: 31.9 g/dL (ref 31.5–36.0)
MCV: 81.6 fL (ref 79.5–101.0)
MONO#: 0.4 10*3/uL (ref 0.1–0.9)
MONO%: 6.9 % (ref 0.0–14.0)
NEUT#: 3.2 10*3/uL (ref 1.5–6.5)
NEUT%: 62.7 % (ref 38.4–76.8)
PLATELETS: 203 10*3/uL (ref 145–400)
RBC: 4.77 10*6/uL (ref 3.70–5.45)
RDW: 14 % (ref 11.2–14.5)
WBC: 5.1 10*3/uL (ref 3.9–10.3)

## 2015-02-10 NOTE — Progress Notes (Signed)
Patient Care Team: Chipper Herb, MD as PCP - General (Family Medicine)  DIAGNOSIS: No matching staging information was found for the patient.  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 She is tolerating it extremely well without any major problems or concerns. She denies any lumps or nodules in the breasts. She is obese and is hoping to lose some weight but is unable to do so. She is very frustrated about it. She reports a tamoxifen makes her fatigued. Has made it difficult for her to exercise frequently. She also developed plantar fasciitis which limits her activity.  REVIEW OF SYSTEMS:   Constitutional: Denies fevers, chills or abnormal weight loss Eyes: Denies blurriness of vision Ears, nose, mouth, throat, and face: Denies mucositis or sore throat Respiratory: Denies cough, dyspnea or wheezes Cardiovascular: Denies palpitation, chest discomfort or lower extremity swelling Gastrointestinal:  Denies nausea, heartburn or change in bowel habits Skin: Denies abnormal skin rashes Lymphatics: Denies new lymphadenopathy or easy bruising Neurological:Denies numbness, tingling or new weaknesses Behavioral/Psych: Mood is stable, no new changes  Breast:  denies any pain or lumps or nodules in either breasts All other systems were reviewed with the patient and are negative.  I have reviewed the past medical history, past surgical history, social history and family history with the patient and they are unchanged from previous note.  ALLERGIES:  is allergic to  lisinopril; bee pollen; and oxycodone.  MEDICATIONS:  Current Outpatient Prescriptions  Medication Sig Dispense Refill  . benzonatate (TESSALON) 100 MG capsule Take 1 capsule (100 mg total) by mouth 2 (two) times daily as needed for cough. 20 capsule 0  . EPINEPHrine (EPI-PEN) 0.3 mg/0.3 mL DEVI Inject 0.3 mLs (0.3 mg total) into the muscle once. 1 Device 1  . EVENING PRIMROSE OIL PO Take by mouth daily as needed.     . fluticasone (FLONASE) 50 MCG/ACT nasal spray Place 2 sprays into both nostrils daily. 16 g 6  . Multiple Vitamins-Minerals (ONE-A-DAY MENOPAUSE FORMULA PO) Take 1 tablet by mouth daily.     . naproxen sodium (ANAPROX) 220 MG tablet Take 220 mg by mouth as needed.    Marland Kitchen PROVENTIL HFA 108 (90 BASE) MCG/ACT inhaler INHALE ONE PUFF BY MOUTH  DAILY AS NEEDED FOR  ASTHMA  SYMPTOMS 8 g 3  . tamoxifen (NOLVADEX) 20 MG tablet Take 1 tablet (20 mg total) by mouth daily. 30 tablet 3  . valsartan (DIOVAN) 160 MG tablet Take 1 tablet (160 mg total) by mouth daily. 30 tablet 0   No current facility-administered medications for this visit.    PHYSICAL EXAMINATION: ECOG PERFORMANCE STATUS: 1 - Symptomatic but completely ambulatory  Filed Vitals:   02/10/15 1543  BP: 151/69  Pulse: 68  Temp: 97.6 F (36.4 C)  Resp: 18   Filed Weights   02/10/15 1543  Weight: 339 lb (153.769 kg)    GENERAL:alert, no distress and comfortable SKIN: skin color, texture, turgor are normal, no rashes or significant lesions EYES: normal, Conjunctiva are pink and non-injected, sclera clear OROPHARYNX:no exudate, no erythema  and lips, buccal mucosa, and tongue normal  NECK: supple, thyroid normal size, non-tender, without nodularity LYMPH:  no palpable lymphadenopathy in the cervical, axillary or inguinal LUNGS: clear to auscultation and percussion with normal breathing effort HEART: regular rate & rhythm and no murmurs and no lower extremity edema ABDOMEN:abdomen soft, non-tender and normal bowel  sounds Musculoskeletal:no cyanosis of digits and no clubbing  NEURO: alert & oriented x 3 with fluent speech, no focal motor/sensory deficits BREAST: No palpable masses or nodules in either right or left breasts. No palpable axillary supraclavicular or infraclavicular adenopathy no breast tenderness or nipple discharge. (exam performed in the presence of a chaperone)  LABORATORY DATA:  I have reviewed the data as listed   Chemistry      Component Value Date/Time   NA 139 02/10/2015 1459   NA 137 01/23/2012 0929   K 4.0 02/10/2015 1459   K 4.9 01/23/2012 0929   CL 104 09/17/2012 1455   CL 101 01/23/2012 0929   CO2 26 02/10/2015 1459   CO2 26 01/23/2012 0929   BUN 11.1 02/10/2015 1459   BUN 10 01/23/2012 0929   CREATININE 0.8 02/10/2015 1459   CREATININE 0.89 01/23/2012 0929      Component Value Date/Time   CALCIUM 9.2 02/10/2015 1459   CALCIUM 9.7 01/23/2012 0929   ALKPHOS 120 02/10/2015 1459   AST 14 02/10/2015 1459   ALT 10 02/10/2015 1459   BILITOT 0.31 02/10/2015 1459       Lab Results  Component Value Date   WBC 5.1 02/10/2015   HGB 12.4 02/10/2015   HCT 38.9 02/10/2015   MCV 81.6 02/10/2015   PLT 203 02/10/2015   NEUTROABS 3.2 02/10/2015   ASSESSMENT & PLAN:  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 Denies any hot flashes or myalgias 2. Complains of fatigue and weight gain  Breast Cancer Surveillance: 1. Breast exam 02/10/15: Normal 2. Mammogram 11/07/14 No abnormalities. Postsurgical changes. Breast Density Category A. I recommended that she get 3-D mammograms for surveillance. Discussed the differences between different breast density categories. I discussed with her the importance of losing weight and exercising regularly to decrease the risk of breast cancer recurrence.  RTC in 1 year   No orders of the defined types were  placed in this encounter.   The patient has a good understanding of the overall plan. she agrees with it. she will call with any problems that may develop before the next visit here.   Rulon Eisenmenger, MD 02/10/2015

## 2015-02-10 NOTE — Telephone Encounter (Signed)
Appointment made and avs printed for patient °

## 2015-02-13 ENCOUNTER — Other Ambulatory Visit: Payer: Self-pay | Admitting: Hematology and Oncology

## 2015-02-13 DIAGNOSIS — C50211 Malignant neoplasm of upper-inner quadrant of right female breast: Secondary | ICD-10-CM

## 2015-02-16 ENCOUNTER — Other Ambulatory Visit: Payer: Self-pay | Admitting: Family Medicine

## 2015-02-16 MED ORDER — ALBUTEROL SULFATE HFA 108 (90 BASE) MCG/ACT IN AERS: INHALATION_SPRAY | RESPIRATORY_TRACT | Status: DC

## 2015-02-16 NOTE — Telephone Encounter (Signed)
Albuterol inhaler which is in med list refilled per protocol. Pt was just seen on 10/17. Sent to pharmacy

## 2015-02-18 ENCOUNTER — Telehealth: Payer: Self-pay | Admitting: Family Medicine

## 2015-02-18 MED ORDER — FLUTICASONE-SALMETEROL 250-50 MCG/DOSE IN AEPB: 1.0000 | INHALATION_SPRAY | Freq: Two times a day (BID) | RESPIRATORY_TRACT | Status: DC

## 2015-02-18 NOTE — Telephone Encounter (Signed)
Left detailed message stating rx was sent to pharmacy and to [facility] with any further questions or concerns.

## 2015-02-18 NOTE — Telephone Encounter (Signed)
Left details of which medications that were refilled.  There is no advair on patient's medication list.  If she received it from another provider at a different location then that could be the reason we do not have it on her list.

## 2015-02-18 NOTE — Telephone Encounter (Signed)
Patient says she had a conversation with you during the visit about Advair and how it helped her around 10 years earlier. You agreed with her that you would send in a new script if she had not improved over a few days.  She says she is not better and  Would like an Advair script sent in, please.

## 2015-02-18 NOTE — Telephone Encounter (Signed)
Go ahead and send her an Advair,Fluticasone 250 mcg/salmeterol 50 mcg twice daily

## 2015-04-07 ENCOUNTER — Telehealth: Payer: Self-pay | Admitting: Family Medicine

## 2015-04-07 MED ORDER — FLUTICASONE-SALMETEROL 100-50 MCG/DOSE IN AEPB: 1.0000 | INHALATION_SPRAY | Freq: Two times a day (BID) | RESPIRATORY_TRACT | Status: DC

## 2015-04-07 NOTE — Telephone Encounter (Signed)
Patient states that the advair is increasing her acid reflux. She states that if she stays off of it a couple of days it improves after stopping advair in 24 hours. When she was on the lower dose in the past she had no problems and wants to know if we can decrease her back down to the 100/50

## 2015-04-07 NOTE — Telephone Encounter (Signed)
Pt notified of rx Verbalizes understanding 

## 2015-04-07 NOTE — Telephone Encounter (Signed)
Advair dose decreased

## 2015-04-24 ENCOUNTER — Other Ambulatory Visit: Payer: Self-pay

## 2015-04-24 DIAGNOSIS — K219 Gastro-esophageal reflux disease without esophagitis: Principal | ICD-10-CM

## 2015-04-24 DIAGNOSIS — IMO0001 Reserved for inherently not codable concepts without codable children: Secondary | ICD-10-CM

## 2015-04-24 MED ORDER — OMEPRAZOLE 40 MG PO CPDR: 40.0000 mg | DELAYED_RELEASE_CAPSULE | Freq: Every day | ORAL | Status: DC

## 2015-05-06 ENCOUNTER — Telehealth: Payer: Self-pay | Admitting: Nutrition

## 2015-05-06 NOTE — Telephone Encounter (Signed)
I contacted patient by phone to let her know about our [place] healthy eating and wellness program. Patient was referred by Dr. Sonny Dandy. Left message on cell phone with program information and my contact information for questions.

## 2015-08-10 ENCOUNTER — Ambulatory Visit (INDEPENDENT_AMBULATORY_CARE_PROVIDER_SITE_OTHER): Payer: BC Managed Care – PPO | Admitting: Family Medicine

## 2015-08-10 ENCOUNTER — Encounter: Payer: Self-pay | Admitting: Family Medicine

## 2015-08-10 VITALS — BP 176/95 | HR 65 | Temp 97.8°F | Ht 69.0 in | Wt 344.0 lb

## 2015-08-10 DIAGNOSIS — I10 Essential (primary) hypertension: Secondary | ICD-10-CM | POA: Diagnosis not present

## 2015-08-10 DIAGNOSIS — J4531 Mild persistent asthma with (acute) exacerbation: Secondary | ICD-10-CM | POA: Diagnosis not present

## 2015-08-10 MED ORDER — AZITHROMYCIN 250 MG PO TABS: ORAL_TABLET | ORAL | Status: DC

## 2015-08-10 MED ORDER — VALSARTAN 160 MG PO TABS: 160.0000 mg | ORAL_TABLET | Freq: Every day | ORAL | Status: DC

## 2015-08-10 MED ORDER — PREDNISONE 20 MG PO TABS: ORAL_TABLET | ORAL | Status: DC

## 2015-08-10 NOTE — Progress Notes (Signed)
BP 176/95 mmHg  Pulse 65  Temp(Src) 97.8 F (36.6 C) (Oral)  Ht 5\' 9"  (1.753 m)  Wt 344 lb (156.037 kg)  BMI 50.78 kg/m2  LMP 12/01/2011   Subjective:    Patient ID: Kayla Neal, female    DOB: [birth year], 55 y.o.   MRN: [id number]  HPI: Kayla Neal is a 55 y.o. female presenting on 08/10/2015 for Hypertension and Sinusitis   HPI Hypertension Patient is coming in today for hypertension recheck. Her blood pressure is 176/95. Patient is currently on Diovan 160. She did not take her medications currently today. Patient denies headaches, blurred vision, chest pains, shortness of breath, or weakness. Denies any side effects from medication and is content with current medication.   Shortness of breath and wheezing In the springtime with allergies being high she is having some issues with her allergies and asthma. She has been having to use her albuterol inhaler 2-3 times daily over the past few weeks. She denies any fevers or chills. She has been having a little bit of shortness of breath or wheezing that is especially worse at night. She is also been using her Flonase sounds since yesterday. She still takes her Advair but not always every day.   Relevant past medical, surgical, family and social history reviewed and updated as indicated. Interim medical history since our last visit reviewed. Allergies and medications reviewed and updated.  Review of Systems  Constitutional: Negative for fever and chills.  HENT: Positive for congestion, postnasal drip, rhinorrhea, sinus pressure, sneezing and sore throat. Negative for ear discharge and ear pain.   Eyes: Negative for pain, redness and visual disturbance.  Respiratory: Positive for cough, shortness of breath and wheezing. Negative for chest tightness.   Cardiovascular: Negative for chest pain and leg swelling.  Genitourinary: Negative for dysuria and difficulty urinating.  Musculoskeletal: Negative for back pain and gait problem.    Skin: Negative for rash.  Neurological: Negative for light-headedness and headaches.  Psychiatric/Behavioral: Negative for behavioral problems and agitation.  All other systems reviewed and are negative.   Per HPI unless specifically indicated above     Medication List       This list is accurate as of: 08/10/15  5:35 PM.  Always use your most recent med list.               albuterol 108 (90 Base) MCG/ACT inhaler  Commonly known as:  PROVENTIL HFA  INHALE ONE PUFF BY MOUTH  DAILY AS NEEDED FOR  ASTHMA  SYMPTOMS     azithromycin 250 MG tablet  Commonly known as:  ZITHROMAX  Take 2 the first day and then one each day after.     EPINEPHrine 0.3 mg/0.3 mL Soaj injection  Commonly known as:  EPI-PEN  Inject 0.3 mLs (0.3 mg total) into the muscle once.     EVENING PRIMROSE OIL PO  Take by mouth daily as needed.     fluticasone 50 MCG/ACT nasal spray  Commonly known as:  FLONASE  Place 2 sprays into both nostrils daily.     Fluticasone-Salmeterol 100-50 MCG/DOSE Aepb  Commonly known as:  ADVAIR  Inhale 1 puff into the lungs 2 (two) times daily.     naproxen sodium 220 MG tablet  Commonly known as:  ANAPROX  Take 220 mg by mouth as needed.     omeprazole 40 MG capsule  Commonly known as:  PRILOSEC  Take 1 capsule (40 mg total) by mouth daily.  ONE-A-DAY MENOPAUSE FORMULA PO  Take 1 tablet by mouth daily.     predniSONE 20 MG tablet  Commonly known as:  DELTASONE  2 po at same time daily for 5 days     tamoxifen 20 MG tablet  Commonly known as:  NOLVADEX  TAKE ONE TABLET BY MOUTH ONCE DAILY     valsartan 160 MG tablet  Commonly known as:  DIOVAN  Take 1 tablet (160 mg total) by mouth daily.           Objective:    BP 173/100 mmHg  Pulse 66  Temp(Src) 97.8 F (36.6 C) (Oral)  Ht 5\' 9"  (1.753 m)  Wt 344 lb (156.037 kg)  BMI 50.78 kg/m2  LMP 12/01/2011  Wt Readings from Last 3 Encounters:  08/10/15 344 lb (156.037 kg)  02/10/15 339 lb (153.769  kg)  02/02/15 336 lb 9.6 oz (152.681 kg)    Physical Exam  Constitutional: She is oriented to person, place, and time. She appears well-developed and well-nourished. No distress.  HENT:  Right Ear: Tympanic membrane, external ear and ear canal normal.  Left Ear: Tympanic membrane, external ear and ear canal normal.  Nose: Mucosal edema and rhinorrhea present. No epistaxis. Right sinus exhibits no maxillary sinus tenderness and no frontal sinus tenderness. Left sinus exhibits no maxillary sinus tenderness and no frontal sinus tenderness.  Mouth/Throat: Uvula is midline and mucous membranes are normal. Posterior oropharyngeal edema and posterior oropharyngeal erythema present. No oropharyngeal exudate or tonsillar abscesses.  Eyes: Conjunctivae and EOM are normal.  Neck: Neck supple. No thyromegaly present.  Cardiovascular: Normal rate, regular rhythm, normal heart sounds and intact distal pulses.   No murmur heard. Pulmonary/Chest: Effort normal. No respiratory distress. She has wheezes (Mild end expiratory wheezes in upper lobes bilaterally). She has no rales. She exhibits no tenderness.  Musculoskeletal: Normal range of motion. She exhibits no edema or tenderness.  Lymphadenopathy:    She has no cervical adenopathy.  Neurological: She is alert and oriented to person, place, and time. Coordination normal.  Skin: Skin is warm and dry. No rash noted. She is not diaphoretic.  Psychiatric: She has a normal mood and affect. Her behavior is normal.  Vitals reviewed.     Assessment & Plan:   Problem List Items Addressed This Visit      Cardiovascular and Mediastinum   Essential hypertension - Primary    She did not take her blood pressure medication before she came in today. Was reinforced that she needs to take it prior to coming in so we can get a good grades of visit is working. She says her blood pressures running a lot better at home when she is taking it.      Relevant Medications    valsartan (DIOVAN) 160 MG tablet    Other Visit Diagnoses    Asthma with exacerbation, mild persistent        Relevant Medications    predniSONE (DELTASONE) 20 MG tablet       Follow up plan: Return in about 4 weeks (around 09/07/2015), or if symptoms worsen or fail to improve, for Hypertension recheck.  Counseling provided for all of the vaccine components No orders of the defined types were placed in this encounter.    Caryl Pina, MD [facility] 08/10/2015, 5:35 PM

## 2015-08-11 ENCOUNTER — Other Ambulatory Visit: Payer: Self-pay | Admitting: Family Medicine

## 2015-08-11 DIAGNOSIS — J4531 Mild persistent asthma with (acute) exacerbation: Secondary | ICD-10-CM

## 2015-08-11 MED ORDER — AZITHROMYCIN 250 MG PO TABS: ORAL_TABLET | ORAL | Status: DC

## 2015-08-11 NOTE — Telephone Encounter (Signed)
Prescription sent to pharmacy.

## 2015-08-12 NOTE — Telephone Encounter (Signed)
Pt is aware.  

## 2015-08-12 NOTE — Assessment & Plan Note (Signed)
She did not take her blood pressure medication before she came in today. Was reinforced that she needs to take it prior to coming in so we can get a good grades of visit is working. She says her blood pressures running a lot better at home when she is taking it.

## 2015-09-09 ENCOUNTER — Ambulatory Visit (INDEPENDENT_AMBULATORY_CARE_PROVIDER_SITE_OTHER): Payer: BC Managed Care – PPO | Admitting: Family Medicine

## 2015-09-09 ENCOUNTER — Encounter: Payer: Self-pay | Admitting: Family Medicine

## 2015-09-09 VITALS — BP 134/82 | HR 66 | Temp 98.4°F | Ht 69.0 in | Wt 342.0 lb

## 2015-09-09 DIAGNOSIS — J302 Other seasonal allergic rhinitis: Secondary | ICD-10-CM | POA: Diagnosis not present

## 2015-09-09 DIAGNOSIS — I10 Essential (primary) hypertension: Secondary | ICD-10-CM | POA: Diagnosis not present

## 2015-09-09 MED ORDER — LEVOCETIRIZINE DIHYDROCHLORIDE 5 MG PO TABS: 5.0000 mg | ORAL_TABLET | Freq: Every evening | ORAL | Status: DC

## 2015-09-09 MED ORDER — VALSARTAN 160 MG PO TABS: 160.0000 mg | ORAL_TABLET | Freq: Every day | ORAL | Status: DC

## 2015-09-09 NOTE — Progress Notes (Signed)
BP 134/82 mmHg  Pulse 66  Temp(Src) 98.4 F (36.9 C) (Oral)  Ht 5\' 9"  (1.753 m)  Wt 342 lb (155.13 kg)  BMI 50.48 kg/m2  LMP 12/01/2011   Subjective:    Patient ID: Kayla Neal, female    DOB: May 01, 1960, 55 y.o.   MRN: [id number]  HPI: Kayla Neal is a 55 y.o. female presenting on 09/09/2015 for Hypertension   HPI Hypertension Patient is coming in for a hypertension recheck. At home she has been getting numbers between 105 over 60s up to 135/83. Here in the office today she is 134/82. Patient denies headaches, blurred vision, chest pains, shortness of breath, or weakness. Denies any side effects from medication and is content with current medication.   Seasonal allergies Patient is coming in for seasonal allergies as well. She used to be on Xyzal quite some time ago and would like to be back on it and get a prescription for it. It always controlled her allergies. Well.  Relevant past medical, surgical, family and social history reviewed and updated as indicated. Interim medical history since our last visit reviewed. Allergies and medications reviewed and updated.  Review of Systems  Constitutional: Negative for fever and chills.  HENT: Positive for postnasal drip, rhinorrhea, sinus pressure, sneezing and sore throat. Negative for congestion, ear discharge and ear pain.   Eyes: Negative for pain, redness and visual disturbance.  Respiratory: Negative for cough, chest tightness and shortness of breath.   Cardiovascular: Negative for chest pain and leg swelling.  Genitourinary: Negative for dysuria and difficulty urinating.  Musculoskeletal: Negative for back pain and gait problem.  Skin: Negative for rash.  Neurological: Negative for dizziness, light-headedness and headaches.  Psychiatric/Behavioral: Negative for behavioral problems and agitation.  All other systems reviewed and are negative.   Per HPI unless specifically indicated above     Medication List         This list is accurate as of: 09/09/15  4:58 PM.  Always use your most recent med list.               albuterol 108 (90 Base) MCG/ACT inhaler  Commonly known as:  PROVENTIL HFA  INHALE ONE PUFF BY MOUTH  DAILY AS NEEDED FOR  ASTHMA  SYMPTOMS     EPINEPHrine 0.3 mg/0.3 mL Soaj injection  Commonly known as:  EPI-PEN  Inject 0.3 mLs (0.3 mg total) into the muscle once.     EVENING PRIMROSE OIL PO  Take by mouth daily as needed.     fluticasone 50 MCG/ACT nasal spray  Commonly known as:  FLONASE  Place 2 sprays into both nostrils daily.     Fluticasone-Salmeterol 100-50 MCG/DOSE Aepb  Commonly known as:  ADVAIR  Inhale 1 puff into the lungs 2 (two) times daily.     levocetirizine 5 MG tablet  Commonly known as:  XYZAL  Take 1 tablet (5 mg total) by mouth every evening.     naproxen sodium 220 MG tablet  Commonly known as:  ANAPROX  Take 220 mg by mouth as needed.     omeprazole 40 MG capsule  Commonly known as:  PRILOSEC  Take 1 capsule (40 mg total) by mouth daily.     ONE-A-DAY MENOPAUSE FORMULA PO  Take 1 tablet by mouth daily.     tamoxifen 20 MG tablet  Commonly known as:  NOLVADEX  TAKE ONE TABLET BY MOUTH ONCE DAILY     valsartan 160 MG tablet  Commonly  known as:  DIOVAN  Take 1 tablet (160 mg total) by mouth daily.           Objective:    BP 134/82 mmHg  Pulse 66  Temp(Src) 98.4 F (36.9 C) (Oral)  Ht 5\' 9"  (1.753 m)  Wt 342 lb (155.13 kg)  BMI 50.48 kg/m2  LMP 12/01/2011  Wt Readings from Last 3 Encounters:  09/09/15 342 lb (155.13 kg)  08/10/15 344 lb (156.037 kg)  02/10/15 339 lb (153.769 kg)    Physical Exam  Constitutional: She is oriented to person, place, and time. She appears well-developed and well-nourished. No distress.  HENT:  Right Ear: External ear normal.  Left Ear: External ear normal.  Nose: Rhinorrhea present. Right sinus exhibits no maxillary sinus tenderness and no frontal sinus tenderness. Left sinus exhibits no  maxillary sinus tenderness and no frontal sinus tenderness.  Mouth/Throat: Posterior oropharyngeal edema present. No oropharyngeal exudate or posterior oropharyngeal erythema.  Eyes: Conjunctivae and EOM are normal. Pupils are equal, round, and reactive to light.  Neck: Neck supple. No thyromegaly present.  Cardiovascular: Normal rate, regular rhythm, normal heart sounds and intact distal pulses.   No murmur heard. Pulmonary/Chest: Effort normal and breath sounds normal. No respiratory distress. She has no wheezes.  Musculoskeletal: Normal range of motion. She exhibits no edema or tenderness.  Lymphadenopathy:    She has no cervical adenopathy.  Neurological: She is alert and oriented to person, place, and time. Coordination normal.  Skin: Skin is warm and dry. No rash noted. She is not diaphoretic.  Psychiatric: She has a normal mood and affect. Her behavior is normal.  Nursing note and vitals reviewed.     Assessment & Plan:   Problem List Items Addressed This Visit      Cardiovascular and Mediastinum   Essential hypertension - Primary   Relevant Medications   valsartan (DIOVAN) 160 MG tablet    Other Visit Diagnoses    Seasonal allergies        Relevant Medications    levocetirizine (XYZAL) 5 MG tablet        Follow up plan: Return in about 6 months (around 03/11/2016), or if symptoms worsen or fail to improve, for Follow-up hypertension.  Counseling provided for all of the vaccine components No orders of the defined types were placed in this encounter.    Caryl Pina, MD [facility] 09/09/2015, 4:58 PM

## 2015-10-26 ENCOUNTER — Other Ambulatory Visit: Payer: Self-pay | Admitting: Hematology and Oncology

## 2015-10-26 DIAGNOSIS — Z853 Personal history of malignant neoplasm of breast: Secondary | ICD-10-CM

## 2015-11-10 ENCOUNTER — Ambulatory Visit
Admission: RE | Admit: 2015-11-10 | Discharge: 2015-11-10 | Disposition: A | Discharge status: Home / Self Care | Payer: BC Managed Care – PPO | Source: Ambulatory Visit | Attending: Hematology and Oncology | Admitting: Hematology and Oncology

## 2015-11-10 DIAGNOSIS — Z853 Personal history of malignant neoplasm of breast: Secondary | ICD-10-CM

## 2016-01-27 ENCOUNTER — Other Ambulatory Visit: Payer: Self-pay | Admitting: Hematology and Oncology

## 2016-01-27 DIAGNOSIS — C50211 Malignant neoplasm of upper-inner quadrant of right female breast: Secondary | ICD-10-CM

## 2016-01-29 NOTE — Telephone Encounter (Signed)
"  Pharmacy sent request but told me to call."  Verified next appointment with patient.  Will send refill at this time.

## 2016-02-10 ENCOUNTER — Telehealth: Payer: Self-pay | Admitting: Hematology and Oncology

## 2016-02-10 NOTE — Telephone Encounter (Signed)
Per patient request. The patient will call back to reschedule later. Appointment canceled Per patient request. The patient will call back to reschedule later.

## 2016-02-11 ENCOUNTER — Ambulatory Visit: Payer: BC Managed Care – PPO | Admitting: Hematology and Oncology

## 2016-03-09 ENCOUNTER — Ambulatory Visit: Payer: BC Managed Care – PPO | Admitting: Family Medicine

## 2016-06-03 ENCOUNTER — Encounter: Payer: Self-pay | Admitting: Family Medicine

## 2016-06-03 ENCOUNTER — Ambulatory Visit (INDEPENDENT_AMBULATORY_CARE_PROVIDER_SITE_OTHER): Payer: BC Managed Care – PPO | Admitting: Family Medicine

## 2016-06-03 VITALS — BP 152/100 | HR 60 | Temp 97.1°F | Ht 69.0 in | Wt 344.0 lb

## 2016-06-03 DIAGNOSIS — M7521 Bicipital tendinitis, right shoulder: Secondary | ICD-10-CM | POA: Diagnosis not present

## 2016-06-03 DIAGNOSIS — S748X1A Injury of other nerves at hip and thigh level, right leg, initial encounter: Secondary | ICD-10-CM

## 2016-06-03 MED ORDER — PREDNISONE 20 MG PO TABS: ORAL_TABLET | ORAL | 0 refills | Status: DC

## 2016-06-03 NOTE — Progress Notes (Signed)
BP (!) 152/100   Pulse 60   Temp 97.1 F (36.2 C) (Oral)   Ht 5\' 9"  (1.753 m)   Wt (!) 344 lb (156 kg)   LMP 12/01/2011   BMI 50.80 kg/m    Subjective:    Patient ID: Kayla Neal, female    DOB: 15-Feb-1961, 56 y.o.   MRN: [id number]  HPI: Kayla Neal is a 56 y.o. female presenting on 06/03/2016 for Right upper arm pain and right thigh numbness (worse at night)   HPI Right shoulder pain Patient has been having pain in her right shoulder on the anterior aspect that is worse with cross the body movement. She denies any pain with abduction or overhead movement or behind her body movement. She does mainly just when she comes across the body. She denies any knowledge of any specific trauma or pulling or twisting to that arm. She thinks it's been going on for a couple weeks now and if she lays on that side it is causing this significant pain in that side of her arm and keeping her from laying on that side at night.  Numbness on right lateral thigh she has been having numbness on the right lateral aspect of her upper thigh that has been going on for about a month. She denies any pain with that except for she does get some tingling sensation when it is coming back she says it's worse and night and then improves quickly throughout the day. This last time that she's gotten it for the past day it has not improved like it had previously.  Relevant past medical, surgical, family and social history reviewed and updated as indicated. Interim medical history since our last visit reviewed. Allergies and medications reviewed and updated.  Review of Systems  Constitutional: Negative for chills and fever.  Respiratory: Negative for chest tightness and shortness of breath.   Cardiovascular: Negative for chest pain and leg swelling.  Musculoskeletal: Positive for arthralgias. Negative for back pain, gait problem and joint swelling.  Skin: Negative for color change and rash.  Neurological: Positive  for numbness. Negative for weakness, light-headedness and headaches.  Psychiatric/Behavioral: Negative for agitation and behavioral problems.  All other systems reviewed and are negative.   Per HPI unless specifically indicated above   Allergies as of 06/03/2016      Reactions   Lisinopril Nausea And Vomiting, Other (See Comments)   Severe stomach cramps   Bee Pollen Hives   Has Hives with Bee Stings   Oxycodone Other (See Comments)   Patient prefers not to be on this med.  It makes her very loopy and unable to function.      Medication List       Accurate as of 06/03/16  9:43 AM. Always use your most recent med list.          albuterol 108 (90 Base) MCG/ACT inhaler Commonly known as:  PROVENTIL HFA INHALE ONE PUFF BY MOUTH  DAILY AS NEEDED FOR  ASTHMA  SYMPTOMS   EPINEPHrine 0.3 mg/0.3 mL Soaj injection Commonly known as:  EPI-PEN Inject 0.3 mLs (0.3 mg total) into the muscle once.   EVENING PRIMROSE OIL PO Take by mouth daily as needed.   fluticasone 50 MCG/ACT nasal spray Commonly known as:  FLONASE Place 2 sprays into both nostrils daily.   Fluticasone-Salmeterol 100-50 MCG/DOSE Aepb Commonly known as:  ADVAIR Inhale 1 puff into the lungs 2 (two) times daily.   levocetirizine 5 MG tablet Commonly  known as:  XYZAL Take 1 tablet (5 mg total) by mouth every evening.   naproxen sodium 220 MG tablet Commonly known as:  ANAPROX Take 220 mg by mouth as needed.   omeprazole 40 MG capsule Commonly known as:  PRILOSEC Take 1 capsule (40 mg total) by mouth daily.   ONE-A-DAY MENOPAUSE FORMULA PO Take 1 tablet by mouth daily.   predniSONE 20 MG tablet Commonly known as:  DELTASONE 2 po at same time daily for 5 days   tamoxifen 20 MG tablet Commonly known as:  NOLVADEX Take 1 tablet (20 mg total) by mouth daily.   valsartan 160 MG tablet Commonly known as:  DIOVAN Take 1 tablet (160 mg total) by mouth daily.          Objective:    BP (!) 152/100    Pulse 60   Temp 97.1 F (36.2 C) (Oral)   Ht 5\' 9"  (1.753 m)   Wt (!) 344 lb (156 kg)   LMP 12/01/2011   BMI 50.80 kg/m   Wt Readings from Last 3 Encounters:  06/03/16 (!) 344 lb (156 kg)  09/09/15 (!) 342 lb (155.1 kg)  08/10/15 (!) 344 lb (156 kg)    Physical Exam  Constitutional: She is oriented to person, place, and time. She appears well-developed and well-nourished. No distress.  Eyes: Conjunctivae are normal.  Neck: Neck supple. No thyromegaly present.  Cardiovascular: Normal rate, regular rhythm, normal heart sounds and intact distal pulses.   No murmur heard. Pulmonary/Chest: Effort normal and breath sounds normal. No respiratory distress. She has no wheezes. She has no rales.  Musculoskeletal: Normal range of motion. She exhibits tenderness. She exhibits no edema.       Right shoulder: She exhibits tenderness (Tenderness over right anterior bicipital tendon, pain with cross body movement but no pain with other range of motion or movements or rotator cuff testing.).       Legs: Lymphadenopathy:    She has no cervical adenopathy.  Neurological: She is alert and oriented to person, place, and time. Coordination normal.  Skin: Skin is warm and dry. No rash noted. She is not diaphoretic.  Psychiatric: She has a normal mood and affect. Her behavior is normal.  Nursing note and vitals reviewed.     Assessment & Plan:   Problem List Items Addressed This Visit    None    Visit Diagnoses    Biceps tendinitis of right upper extremity    -  Primary   We'll do anti-inflammatories to see for improvement, if not may warrant further imaging in future.   Relevant Medications   predniSONE (DELTASONE) 20 MG tablet   Injury of other nerves at hip and thigh level, right leg, initial encounter       Likely nerve impingement, we'll do anti-inflammatories and see if we can improve it if not may warrant back imaging in future.   Relevant Medications   predniSONE (DELTASONE) 20 MG tablet         Follow up plan: Return in about 4 weeks (around 07/01/2016), or if symptoms worsen or fail to improve, for Hypertension.  Counseling provided for all of the vaccine components No orders of the defined types were placed in this encounter.   Caryl Pina, MD [facility] 06/03/2016, 9:43 AM

## 2016-06-30 ENCOUNTER — Ambulatory Visit (INDEPENDENT_AMBULATORY_CARE_PROVIDER_SITE_OTHER): Payer: BC Managed Care – PPO | Admitting: Family Medicine

## 2016-06-30 ENCOUNTER — Encounter: Payer: Self-pay | Admitting: Family Medicine

## 2016-06-30 VITALS — BP 122/69 | HR 71 | Temp 98.0°F | Ht 69.0 in | Wt 349.2 lb

## 2016-06-30 DIAGNOSIS — D649 Anemia, unspecified: Secondary | ICD-10-CM | POA: Diagnosis not present

## 2016-06-30 DIAGNOSIS — I10 Essential (primary) hypertension: Secondary | ICD-10-CM

## 2016-06-30 DIAGNOSIS — Z114 Encounter for screening for human immunodeficiency virus [HIV]: Secondary | ICD-10-CM | POA: Diagnosis not present

## 2016-06-30 DIAGNOSIS — Z1159 Encounter for screening for other viral diseases: Secondary | ICD-10-CM

## 2016-06-30 NOTE — Progress Notes (Signed)
BP 122/69   Pulse 71   Temp 98 F (36.7 C) (Oral)   Ht 5' 9"  (1.753 m)   Wt (!) 349 lb 4 oz (158.4 kg)   LMP 12/01/2011   BMI 51.58 kg/m    Subjective:    Patient ID: Kayla Neal, female    DOB: 31-Jul-1960, 56 y.o.   MRN: [id number]  HPI: [name] is a 56 y.o. female presenting on 06/30/2016 for Hypertension (4 week followup)   HPI Hypertension 4 week follow-up and labs Patient is coming back for a four-week blood pressure recheck. Her blood pressure today is 122/69. She is currently on Diovan. Patient denies headaches, blurred vision, chest pains, shortness of breath, or weakness. Denies any side effects from medication and is content with current medication. Patient is obese as well and we discussed some lifestyle changes for this.  Anemia recheck Patient is coming in for labs and anemia recheck as well today. She denies any lightheadedness or dizziness or chest pain or shortness of breath. He denies any major fatigue.  Relevant past medical, surgical, family and social history reviewed and updated as indicated. Interim medical history since our last visit reviewed. Allergies and medications reviewed and updated.  Review of Systems  Constitutional: Negative for chills, fatigue and fever.  HENT: Negative for congestion, ear discharge and ear pain.   Eyes: Negative for visual disturbance.  Respiratory: Negative for chest tightness and shortness of breath.   Cardiovascular: Negative for chest pain and leg swelling.  Genitourinary: Negative for difficulty urinating and dysuria.  Musculoskeletal: Negative for back pain and gait problem.  Skin: Negative for rash.  Neurological: Negative for dizziness, light-headedness and headaches.  Psychiatric/Behavioral: Negative for agitation and behavioral problems.  All other systems reviewed and are negative.   Per HPI unless specifically indicated above   Allergies as of 06/30/2016      Reactions   Lisinopril Nausea And  Vomiting, Other (See Comments)   Severe stomach cramps   Bee Pollen Hives   Has Hives with Bee Stings   Oxycodone Other (See Comments)   Patient prefers not to be on this med.  It makes her very loopy and unable to function.      Medication List       Accurate as of 06/30/16  5:00 PM. Always use your most recent med list.          albuterol 108 (90 Base) MCG/ACT inhaler Commonly known as:  PROVENTIL HFA INHALE ONE PUFF BY MOUTH  DAILY AS NEEDED FOR  ASTHMA  SYMPTOMS   EPINEPHrine 0.3 mg/0.3 mL Soaj injection Commonly known as:  EPI-PEN Inject 0.3 mLs (0.3 mg total) into the muscle once.   EVENING PRIMROSE OIL PO Take by mouth daily as needed.   Fluticasone-Salmeterol 100-50 MCG/DOSE Aepb Commonly known as:  ADVAIR Inhale 1 puff into the lungs 2 (two) times daily.   levocetirizine 5 MG tablet Commonly known as:  XYZAL Take 1 tablet (5 mg total) by mouth every evening.   naproxen sodium 220 MG tablet Commonly known as:  ANAPROX Take 220 mg by mouth as needed.   omeprazole 40 MG capsule Commonly known as:  PRILOSEC Take 1 capsule (40 mg total) by mouth daily.   ONE-A-DAY MENOPAUSE FORMULA PO Take 1 tablet by mouth daily.   predniSONE 20 MG tablet Commonly known as:  DELTASONE 2 po at same time daily for 5 days   tamoxifen 20 MG tablet Commonly known as:  NOLVADEX  Take 1 tablet (20 mg total) by mouth daily.   valsartan 160 MG tablet Commonly known as:  DIOVAN Take 1 tablet (160 mg total) by mouth daily.          Objective:    BP 122/69   Pulse 71   Temp 98 F (36.7 C) (Oral)   Ht 5' 9"  (1.753 m)   Wt (!) 349 lb 4 oz (158.4 kg)   LMP 12/01/2011   BMI 51.58 kg/m   Wt Readings from Last 3 Encounters:  06/30/16 (!) 349 lb 4 oz (158.4 kg)  06/03/16 (!) 344 lb (156 kg)  09/09/15 (!) 342 lb (155.1 kg)    Physical Exam  Constitutional: She is oriented to person, place, and time. She appears well-developed and well-nourished. No distress.  Eyes:  Conjunctivae are normal.  Neck: Neck supple.  Cardiovascular: Normal rate, regular rhythm, normal heart sounds and intact distal pulses.   No murmur heard. Pulmonary/Chest: Effort normal and breath sounds normal. No respiratory distress. She has no wheezes. She has no rales.  Musculoskeletal: Normal range of motion. She exhibits no edema or tenderness.  Lymphadenopathy:    She has no cervical adenopathy.  Neurological: She is alert and oriented to person, place, and time. Coordination normal.  Skin: Skin is warm and dry. No rash noted. She is not diaphoretic.  Psychiatric: She has a normal mood and affect. Her behavior is normal. Judgment and thought content normal.  Nursing note and vitals reviewed.     Assessment & Plan:   Problem List Items Addressed This Visit      Cardiovascular and Mediastinum   Essential hypertension - Primary   Relevant Orders   CMP14+EGFR     Other   Anemia   Relevant Orders   CBC with Differential/Platelet   Obesities   Relevant Orders   Lipid panel    Other Visit Diagnoses    Screening for HIV without presence of risk factors       Relevant Orders   HIV antibody   Need for hepatitis C screening test       Relevant Orders   Hepatitis C antibody       Follow up plan: Return in about 6 months (around 12/31/2016), or if symptoms worsen or fail to improve, for Hypertension recheck and anemia recheck.  Counseling provided for all of the vaccine components Orders Placed This Encounter  Procedures  . CBC with Differential/Platelet  . CMP14+EGFR  . Lipid panel  . HIV antibody  . Hepatitis C antibody    Caryl Pina, MD [facility] 06/30/2016, 5:00 PM

## 2016-07-01 LAB — CBC WITH DIFFERENTIAL/PLATELET
Basophils Absolute: 0 10*3/uL (ref 0.0–0.2)
Basos: 0 %
EOS (ABSOLUTE): 0.1 10*3/uL (ref 0.0–0.4)
Eos: 2 %
Hematocrit: 39.3 % (ref 34.0–46.6)
Hemoglobin: 12.7 g/dL (ref 11.1–15.9)
Immature Grans (Abs): 0 10*3/uL (ref 0.0–0.1)
Immature Granulocytes: 0 %
Lymphocytes Absolute: 1.4 10*3/uL (ref 0.7–3.1)
Lymphs: 26 %
MCH: 26.1 pg — ABNORMAL LOW (ref 26.6–33.0)
MCHC: 32.3 g/dL (ref 31.5–35.7)
MCV: 81 fL (ref 79–97)
MONOS ABS: 0.4 10*3/uL (ref 0.1–0.9)
Monocytes: 6 %
Neutrophils Absolute: 3.7 10*3/uL (ref 1.4–7.0)
Neutrophils: 66 %
PLATELETS: 248 10*3/uL (ref 150–379)
RBC: 4.87 x10E6/uL (ref 3.77–5.28)
RDW: 14.9 % (ref 12.3–15.4)
WBC: 5.6 10*3/uL (ref 3.4–10.8)

## 2016-07-01 LAB — CMP14+EGFR
A/G RATIO: 1.6 (ref 1.2–2.2)
ALK PHOS: 118 IU/L — AB (ref 39–117)
ALT: 10 IU/L (ref 0–32)
AST: 18 IU/L (ref 0–40)
Albumin: 4.3 g/dL (ref 3.5–5.5)
BUN/Creatinine Ratio: 13 (ref 9–23)
BUN: 11 mg/dL (ref 6–24)
Bilirubin Total: 0.3 mg/dL (ref 0.0–1.2)
CO2: 26 mmol/L (ref 18–29)
Calcium: 9.5 mg/dL (ref 8.7–10.2)
Chloride: 97 mmol/L (ref 96–106)
Creatinine, Ser: 0.85 mg/dL (ref 0.57–1.00)
GFR calc Af Amer: 90 mL/min/{1.73_m2} (ref >59)
GFR calc non Af Amer: 78 mL/min/{1.73_m2} (ref >59)
Globulin, Total: 2.7 g/dL (ref 1.5–4.5)
Glucose: 88 mg/dL (ref 65–99)
POTASSIUM: 4.7 mmol/L (ref 3.5–5.2)
SODIUM: 137 mmol/L (ref 134–144)
Total Protein: 7 g/dL (ref 6.0–8.5)

## 2016-07-01 LAB — LIPID PANEL
Chol/HDL Ratio: 4.3 ratio units (ref 0.0–4.4)
Cholesterol, Total: 236 mg/dL — ABNORMAL HIGH (ref 100–199)
HDL: 55 mg/dL (ref >39)
LDL Calculated: 156 mg/dL — ABNORMAL HIGH (ref 0–99)
Triglycerides: 124 mg/dL (ref 0–149)
VLDL Cholesterol Cal: 25 mg/dL (ref 5–40)

## 2016-07-01 LAB — HIV ANTIBODY (ROUTINE TESTING W REFLEX): HIV SCREEN 4TH GENERATION: NONREACTIVE

## 2016-07-01 LAB — HEPATITIS C ANTIBODY: Hep C Virus Ab: <0.1 s/co ratio (ref 0.0–0.9)

## 2016-09-29 ENCOUNTER — Other Ambulatory Visit: Payer: Self-pay | Admitting: Hematology and Oncology

## 2016-09-29 DIAGNOSIS — Z853 Personal history of malignant neoplasm of breast: Secondary | ICD-10-CM

## 2016-10-13 ENCOUNTER — Other Ambulatory Visit: Payer: Self-pay | Admitting: Family Medicine

## 2016-10-13 DIAGNOSIS — I10 Essential (primary) hypertension: Secondary | ICD-10-CM

## 2016-11-04 ENCOUNTER — Telehealth: Payer: Self-pay | Admitting: Family Medicine

## 2016-11-04 MED ORDER — NAPROXEN SODIUM 220 MG PO TABS: 220.0000 mg | ORAL_TABLET | Freq: Two times a day (BID) | ORAL | 11 refills | Status: AC | PRN

## 2016-11-04 MED ORDER — MENTHOL (TOPICAL ANALGESIC) 4 % EX GEL: CUTANEOUS | 11 refills | Status: DC

## 2016-11-04 MED ORDER — NAPROXEN SODIUM 220 MG PO TABS: 220.0000 mg | ORAL_TABLET | Freq: Two times a day (BID) | ORAL | 11 refills | Status: DC | PRN

## 2016-11-04 NOTE — Telephone Encounter (Signed)
Patient notified that rxs sent to pharmacy 

## 2016-11-04 NOTE — Telephone Encounter (Signed)
I am fine with sending these refills for her, I do not know what dose or performed the Biofreeze roll-on comes in but go ahead and send both for her with 11 refills

## 2016-11-04 NOTE — Telephone Encounter (Signed)
Please review and advise.

## 2016-11-10 ENCOUNTER — Ambulatory Visit
Admission: RE | Admit: 2016-11-10 | Discharge: 2016-11-10 | Disposition: A | Discharge status: Home / Self Care | Payer: BC Managed Care – PPO | Source: Ambulatory Visit | Attending: Hematology and Oncology | Admitting: Hematology and Oncology

## 2016-11-10 DIAGNOSIS — Z853 Personal history of malignant neoplasm of breast: Secondary | ICD-10-CM

## 2016-11-10 HISTORY — DX: Personal history of irradiation: Z92.3

## 2017-02-01 ENCOUNTER — Other Ambulatory Visit: Payer: Self-pay | Admitting: Hematology and Oncology

## 2017-02-01 DIAGNOSIS — C50211 Malignant neoplasm of upper-inner quadrant of right female breast: Secondary | ICD-10-CM

## 2017-02-03 ENCOUNTER — Telehealth: Payer: Self-pay

## 2017-02-03 ENCOUNTER — Telehealth: Payer: Self-pay | Admitting: Medical Oncology

## 2017-02-03 ENCOUNTER — Other Ambulatory Visit: Payer: Self-pay

## 2017-02-03 DIAGNOSIS — C50211 Malignant neoplasm of upper-inner quadrant of right female breast: Secondary | ICD-10-CM

## 2017-02-03 MED ORDER — TAMOXIFEN CITRATE 20 MG PO TABS: 20.0000 mg | ORAL_TABLET | Freq: Every day | ORAL | 0 refills | Status: DC

## 2017-02-03 NOTE — Telephone Encounter (Signed)
Pt said [facility] did not have tamoxifen refill. I refilled it per Gudena order on 01/28/17

## 2017-02-03 NOTE — Telephone Encounter (Signed)
Called patient regarding her Tamoxifen. We refilled for 30 day and informed her she needed to make an office appt to see Dr. Lindi Adie. That she has not seen him since Oct 2016. Offered to schedule that for her and she stated she would like to call back when she has her calendar with her.  Cyndia Bent RN

## 2017-02-08 ENCOUNTER — Ambulatory Visit (INDEPENDENT_AMBULATORY_CARE_PROVIDER_SITE_OTHER): Payer: BC Managed Care – PPO | Admitting: Pediatrics

## 2017-02-08 ENCOUNTER — Encounter: Payer: Self-pay | Admitting: Pediatrics

## 2017-02-08 VITALS — BP 144/94 | HR 89 | Temp 97.5°F | Resp 22 | Ht 69.0 in | Wt 351.2 lb

## 2017-02-08 DIAGNOSIS — J302 Other seasonal allergic rhinitis: Secondary | ICD-10-CM

## 2017-02-08 DIAGNOSIS — I10 Essential (primary) hypertension: Secondary | ICD-10-CM | POA: Diagnosis not present

## 2017-02-08 DIAGNOSIS — J019 Acute sinusitis, unspecified: Secondary | ICD-10-CM | POA: Diagnosis not present

## 2017-02-08 DIAGNOSIS — J4530 Mild persistent asthma, uncomplicated: Secondary | ICD-10-CM

## 2017-02-08 MED ORDER — VALSARTAN 160 MG PO TABS: ORAL_TABLET | ORAL | 0 refills | Status: DC

## 2017-02-08 MED ORDER — FLUTICASONE-SALMETEROL 100-50 MCG/DOSE IN AEPB: 1.0000 | INHALATION_SPRAY | Freq: Two times a day (BID) | RESPIRATORY_TRACT | 3 refills | Status: DC

## 2017-02-08 MED ORDER — CHLORPHENIRAMINE-ACETAMINOPHEN 2-325 MG PO TABS: 1.0000 | ORAL_TABLET | Freq: Every day | ORAL | 0 refills | Status: DC | PRN

## 2017-02-08 MED ORDER — LEVOCETIRIZINE DIHYDROCHLORIDE 5 MG PO TABS: 5.0000 mg | ORAL_TABLET | Freq: Every evening | ORAL | 3 refills | Status: DC

## 2017-02-08 MED ORDER — AMOXICILLIN-POT CLAVULANATE 875-125 MG PO TABS: 1.0000 | ORAL_TABLET | Freq: Two times a day (BID) | ORAL | 0 refills | Status: DC

## 2017-02-08 MED ORDER — SPACER/AERO CHAMBER MOUTHPIECE MISC: 1.0000 | Freq: Four times a day (QID) | 0 refills | Status: AC | PRN

## 2017-02-08 MED ORDER — ALBUTEROL SULFATE HFA 108 (90 BASE) MCG/ACT IN AERS: INHALATION_SPRAY | RESPIRATORY_TRACT | 2 refills | Status: DC

## 2017-02-08 NOTE — Progress Notes (Signed)
  Subjective:   Patient ID: Kayla Neal, female    DOB: Jan 19, 1961, 56 y.o.   MRN: [id number] CC: Shortness of Breath; Facial Pain; low grade fever; and BP fluctuation  HPI: Kayla Neal is a 56 y.o. female presenting for Shortness of Breath; Facial Pain; low grade fever; and BP fluctuation  HTN: takes valsartan regularlu Had a URI about a month ago Wasn't able to exercise as much since then Says BP was better when she was walking regularly Up to 160s/100 when feeling bac, then to 120s-130s /80s Took OTC cold medicines first few days  Has subjective fevers at home, up to 99 Feels hot with temperatures Has ongoing pressure in her sinuses  Asthma: about a week ago started having more SOB   Relevant past medical, surgical, family and social history reviewed. Allergies and medications reviewed and updated. History  Smoking Status  . Never Smoker  Smokeless Tobacco  . Never Used   ROS: Per HPI   Objective:    BP (!) 144/94   Pulse 89   Temp (!) 97.5 F (36.4 C) (Oral)   Resp (!) 22   Ht 5\' 9"  (1.753 m)   Wt (!) 351 lb 3.2 oz (159.3 kg)   LMP 12/01/2011   SpO2 96%   BMI 51.86 kg/m   Wt Readings from Last 3 Encounters:  02/08/17 (!) 351 lb 3.2 oz (159.3 kg)  06/30/16 (!) 349 lb 4 oz (158.4 kg)  06/03/16 (!) 344 lb (156 kg)    Gen: NAD, alert, cooperative with exam, NCAT EYES: EOMI, no conjunctival injection, or no icterus ENT:  TMs dull red gray b/l, OP with mild erythema, ttp over max sinuses b/l LYMPH: no cervical LAD CV: NRRR, normal S1/S2, no murmur, distal pulses 2+ b/l Resp: CTABL, no wheezes, normal WOB Abd: +BS, soft, NTND.  Ext: No edema, warm Neuro: Alert and oriented  Assessment & Plan:  Truly was seen today for shortness of breath, facial pain, low grade fever and bp fluctuation.  Diagnoses and all orders for this visit:  Acute sinusitis, recurrence not specified, unspecified location Start below Cont sinus rinses -     Chlorpheniramine-APAP  (CORICIDIN) 2-325 MG TABS; Take 1 tablet by mouth daily as needed. -     amoxicillin-clavulanate (AUGMENTIN) 875-125 MG tablet; Take 1 tablet by mouth 2 (two) times daily.  Mild persistent asthma, unspecified whether complicated Start advair Winter tends to be when she gets exacerbations Albuterol when wheezing -     Fluticasone-Salmeterol (ADVAIR) 100-50 MCG/DOSE AEPB; Inhale 1 puff into the lungs 2 (two) times daily. -     albuterol (VENTOLIN HFA) 108 (90 Base) MCG/ACT inhaler; INHALE ONE PUFF BY MOUTH DAILY AS NEEDED FOR ASTHMA SYMPTOMS -     Spacer/Aero Chamber Mouthpiece MISC; 1 each by Does not apply route every 6 (six) hours as needed.  Seasonal allergies -     levocetirizine (XYZAL) 5 MG tablet; Take 1 tablet (5 mg total) by mouth every evening.  Essential hypertension Elevated today Says better at home Check at home, rtc 2-3 weeks for f/u with PCP -     valsartan (DIOVAN) 160 MG tablet; TAKE ONE TABLET BY MOUTH ONCE DAILY (NEEDS TO BE SEEN FOR REFILLS)   Follow up plan: Return in about 3 weeks (around 03/01/2017). Assunta Found, MD [facility]

## 2017-02-15 ENCOUNTER — Telehealth: Payer: Self-pay | Admitting: Pediatrics

## 2017-02-15 DIAGNOSIS — J019 Acute sinusitis, unspecified: Secondary | ICD-10-CM

## 2017-02-15 MED ORDER — AMOXICILLIN-POT CLAVULANATE 875-125 MG PO TABS: 1.0000 | ORAL_TABLET | Freq: Two times a day (BID) | ORAL | 0 refills | Status: DC

## 2017-02-15 NOTE — Telephone Encounter (Signed)
Left detailed message that meds have been sent to pharmacy

## 2017-02-15 NOTE — Telephone Encounter (Signed)
Sent in #8 tabs, take for total of 10 days

## 2017-02-22 ENCOUNTER — Other Ambulatory Visit: Payer: Self-pay

## 2017-02-22 DIAGNOSIS — C50211 Malignant neoplasm of upper-inner quadrant of right female breast: Secondary | ICD-10-CM

## 2017-02-22 MED ORDER — TAMOXIFEN CITRATE 20 MG PO TABS: 20.0000 mg | ORAL_TABLET | Freq: Every day | ORAL | 0 refills | Status: DC

## 2017-03-02 ENCOUNTER — Ambulatory Visit: Payer: BC Managed Care – PPO | Admitting: Family Medicine

## 2017-03-16 ENCOUNTER — Ambulatory Visit: Payer: BC Managed Care – PPO | Admitting: Hematology and Oncology

## 2017-03-16 NOTE — Assessment & Plan Note (Deleted)
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 Denies any hot flashes or myalgias 2. Complains of fatigue and weight gain  Patient will complete 5 years of therapy by February 2019.  She will be able to stop it at that time.  There is no role of extended adjuvant therapy for DCIS.  Breast Cancer Surveillance: 1. Breast exam 03/16/2017: Normal 2. Mammogram 11/10/2016 no abnormalities. Postsurgical changes. Breast Density Category A  Return to clinic in 1 year for follow-up with survivorship clinic

## 2017-03-30 ENCOUNTER — Ambulatory Visit: Payer: BC Managed Care – PPO | Admitting: Hematology and Oncology

## 2017-03-30 NOTE — Assessment & Plan Note (Deleted)
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 Denies any hot flashes or myalgias 2. Complains of fatigue and weight gain  Breast Cancer Surveillance: 1. Breast exam 03/30/2017: Normal 2. Mammogram 11/10/2016 no abnormalities. Postsurgical changes. Breast Density Category A.   RTC in 1 year with survivorship clinic

## 2017-04-14 ENCOUNTER — Other Ambulatory Visit: Payer: Self-pay

## 2017-04-14 ENCOUNTER — Telehealth: Payer: Self-pay | Admitting: Hematology and Oncology

## 2017-04-14 DIAGNOSIS — C50211 Malignant neoplasm of upper-inner quadrant of right female breast: Secondary | ICD-10-CM

## 2017-04-14 MED ORDER — TAMOXIFEN CITRATE 20 MG PO TABS: 20.0000 mg | ORAL_TABLET | Freq: Every day | ORAL | 0 refills | Status: DC

## 2017-04-14 NOTE — Telephone Encounter (Signed)
Spoke to patient regarding upcoming January appointments per 12/28 sch message

## 2017-05-02 ENCOUNTER — Telehealth: Payer: Self-pay | Admitting: Hematology and Oncology

## 2017-05-02 NOTE — Telephone Encounter (Signed)
Patient called to reschedule  °

## 2017-05-03 ENCOUNTER — Ambulatory Visit: Payer: BC Managed Care – PPO | Admitting: Hematology and Oncology

## 2017-05-17 ENCOUNTER — Telehealth: Payer: Self-pay | Admitting: Hematology and Oncology

## 2017-05-17 NOTE — Telephone Encounter (Signed)
Patient called to cancel appointment.

## 2017-05-18 ENCOUNTER — Ambulatory Visit: Payer: BC Managed Care – PPO | Admitting: Hematology and Oncology

## 2017-05-25 ENCOUNTER — Ambulatory Visit: Payer: BC Managed Care – PPO | Admitting: Family Medicine

## 2017-05-25 ENCOUNTER — Encounter: Payer: Self-pay | Admitting: Family Medicine

## 2017-05-25 VITALS — BP 157/90 | HR 72 | Temp 98.5°F | Ht 69.0 in | Wt 352.0 lb

## 2017-05-25 DIAGNOSIS — I10 Essential (primary) hypertension: Secondary | ICD-10-CM | POA: Diagnosis not present

## 2017-05-25 DIAGNOSIS — R011 Cardiac murmur, unspecified: Secondary | ICD-10-CM

## 2017-05-25 NOTE — Progress Notes (Signed)
BP (!) 158/79   Pulse 72   Temp 98.5 F (36.9 C) (Oral)   Ht 5' 9"  (1.753 m)   Wt (!) 352 lb (159.7 kg)   LMP 12/01/2011   BMI 51.98 kg/m    Subjective:    Patient ID: Kayla Neal, female    DOB: 04-24-1960, 57 y.o.   MRN: [id number]  HPI: Kayla Neal is a 57 y.o. female presenting on 05/25/2017 for Hypertension (elevated x 4 days)   HPI Hypertension recheck Patient is coming in for hypertension recheck.  She says over the past 4 days her blood pressure has been running up and she has had numbers in the 166M systolic and as high as 600/459 and also 175/90 over the past few days.  She went got her batteries changed and then came in here to get it checked in today here in the office she is at 158/79.  She currently is on valsartan and she is been taking it consistently every day and it had been running well before the past few days but the past few days it has not.  She denies any chest pain or shortness of breath or wheezing.  She denies any lightheadedness cannot think of any reason why it would be running elevated.  Relevant past medical, surgical, family and social history reviewed and updated as indicated. Interim medical history since our last visit reviewed. Allergies and medications reviewed and updated.  Review of Systems  Constitutional: Negative for chills and fever.  Eyes: Negative for visual disturbance.  Respiratory: Negative for chest tightness and shortness of breath.   Cardiovascular: Negative for chest pain and leg swelling.  Musculoskeletal: Negative for back pain and gait problem.  Skin: Negative for rash.  Neurological: Negative for dizziness, light-headedness and headaches.  Psychiatric/Behavioral: Negative for agitation and behavioral problems.  All other systems reviewed and are negative.   Per HPI unless specifically indicated above   Allergies as of 05/25/2017      Reactions   Lisinopril Nausea And Vomiting, Other (See Comments)   Severe stomach  cramps   Bee Pollen Hives   Has Hives with Bee Stings   Oxycodone Other (See Comments)   Patient prefers not to be on this med.  It makes her very loopy and unable to function.      Medication List        Accurate as of 05/25/17  2:49 PM. Always use your most recent med list.          albuterol 108 (90 Base) MCG/ACT inhaler Commonly known as:  VENTOLIN HFA INHALE ONE PUFF BY MOUTH DAILY AS NEEDED FOR ASTHMA SYMPTOMS   EPINEPHrine 0.3 mg/0.3 mL Soaj injection Commonly known as:  EPI-PEN Inject 0.3 mLs (0.3 mg total) into the muscle once.   EVENING PRIMROSE OIL PO Take by mouth daily as needed.   Fluticasone-Salmeterol 100-50 MCG/DOSE Aepb Commonly known as:  ADVAIR Inhale 1 puff into the lungs 2 (two) times daily.   levocetirizine 5 MG tablet Commonly known as:  XYZAL Take 1 tablet (5 mg total) by mouth every evening.   Menthol (Topical Analgesic) 4 % Gel Commonly known as:  BIOFREEZE ROLL-ON Apply to affected area up to 4 times daily   naproxen sodium 220 MG tablet Commonly known as:  ALEVE Take 1 tablet (220 mg total) by mouth 2 (two) times daily as needed.   ONE-A-DAY MENOPAUSE FORMULA PO Take 1 tablet by mouth daily.   Spacer/Aero Chamber [employer]  1 each by Does not apply route every 6 (six) hours as needed.   tamoxifen 20 MG tablet Commonly known as:  NOLVADEX Take 1 tablet (20 mg total) by mouth daily.   valsartan 160 MG tablet Commonly known as:  DIOVAN TAKE ONE TABLET BY MOUTH ONCE DAILY (NEEDS TO BE SEEN FOR REFILLS)          Objective:    BP (!) 158/79   Pulse 72   Temp 98.5 F (36.9 C) (Oral)   Ht 5' 9"  (1.753 m)   Wt (!) 352 lb (159.7 kg)   LMP 12/01/2011   BMI 51.98 kg/m   Wt Readings from Last 3 Encounters:  05/25/17 (!) 352 lb (159.7 kg)  02/08/17 (!) 351 lb 3.2 oz (159.3 kg)  06/30/16 (!) 349 lb 4 oz (158.4 kg)    Physical Exam  Constitutional: She is oriented to person, place, and time. She appears well-developed and  well-nourished. No distress.  Eyes: Conjunctivae are normal.  Neck: Neck supple. No thyromegaly present.  Cardiovascular: Normal rate, regular rhythm and intact distal pulses.  Murmur heard.  Crescendo decrescendo systolic murmur is present with a grade of 2/6. Pulmonary/Chest: Effort normal and breath sounds normal. No respiratory distress. She has no wheezes. She has no rales.  Musculoskeletal: Normal range of motion.  Lymphadenopathy:    She has no cervical adenopathy.  Neurological: She is alert and oriented to person, place, and time. Coordination normal.  Skin: Skin is warm and dry. No rash noted. She is not diaphoretic.  Psychiatric: She has a normal mood and affect. Her behavior is normal.  Nursing note and vitals reviewed.       Assessment & Plan:   Problem List Items Addressed This Visit      Cardiovascular and Mediastinum   Essential hypertension - Primary   Relevant Orders   CMP14+EGFR   Lipid panel   TSH    Other Visit Diagnoses    Systolic murmur       Relevant Orders   ECHOCARDIOGRAM COMPLETE       Follow up plan: Return in about 8 weeks (around 07/20/2017), or if symptoms worsen or fail to improve, for Recheck hypertension.  Counseling provided for all of the vaccine components Orders Placed This Encounter  Procedures  . CMP14+EGFR  . Lipid panel  . TSH  . ECHOCARDIOGRAM Nobleton Dettinger, MD [facility] 05/25/2017, 2:49 PM

## 2017-05-26 LAB — CMP14+EGFR
ALBUMIN: 4.2 g/dL (ref 3.5–5.5)
ALK PHOS: 114 IU/L (ref 39–117)
ALT: 12 IU/L (ref 0–32)
AST: 15 IU/L (ref 0–40)
Albumin/Globulin Ratio: 1.4 (ref 1.2–2.2)
BUN/Creatinine Ratio: 11 (ref 9–23)
BUN: 10 mg/dL (ref 6–24)
Bilirubin Total: 0.4 mg/dL (ref 0.0–1.2)
CALCIUM: 9.4 mg/dL (ref 8.7–10.2)
CO2: 26 mmol/L (ref 20–29)
Chloride: 102 mmol/L (ref 96–106)
Creatinine, Ser: 0.9 mg/dL (ref 0.57–1.00)
GFR calc Af Amer: 83 mL/min/{1.73_m2} (ref >59)
GFR, EST NON AFRICAN AMERICAN: 72 mL/min/{1.73_m2} (ref >59)
GLOBULIN, TOTAL: 3 g/dL (ref 1.5–4.5)
GLUCOSE: 85 mg/dL (ref 65–99)
Potassium: 3.9 mmol/L (ref 3.5–5.2)
Sodium: 141 mmol/L (ref 134–144)
Total Protein: 7.2 g/dL (ref 6.0–8.5)

## 2017-05-26 LAB — TSH: TSH: 2.39 u[IU]/mL (ref 0.450–4.500)

## 2017-05-26 LAB — LIPID PANEL
CHOL/HDL RATIO: 3.9 ratio (ref 0.0–4.4)
CHOLESTEROL TOTAL: 219 mg/dL — AB (ref 100–199)
HDL: 56 mg/dL (ref >39)
LDL CALC: 147 mg/dL — AB (ref 0–99)
TRIGLYCERIDES: 81 mg/dL (ref 0–149)
VLDL CHOLESTEROL CAL: 16 mg/dL (ref 5–40)

## 2017-06-07 ENCOUNTER — Other Ambulatory Visit (HOSPITAL_COMMUNITY): Payer: BC Managed Care – PPO

## 2017-06-13 ENCOUNTER — Other Ambulatory Visit (HOSPITAL_COMMUNITY): Payer: BC Managed Care – PPO

## 2017-07-06 ENCOUNTER — Other Ambulatory Visit: Payer: Self-pay | Admitting: Family Medicine

## 2017-07-06 DIAGNOSIS — I10 Essential (primary) hypertension: Secondary | ICD-10-CM

## 2017-07-26 ENCOUNTER — Ambulatory Visit: Payer: BC Managed Care – PPO | Admitting: Family Medicine

## 2017-08-30 ENCOUNTER — Ambulatory Visit: Payer: BC Managed Care – PPO | Admitting: Family Medicine

## 2017-08-31 ENCOUNTER — Other Ambulatory Visit: Payer: Self-pay | Admitting: Hematology and Oncology

## 2017-08-31 DIAGNOSIS — Z1231 Encounter for screening mammogram for malignant neoplasm of breast: Secondary | ICD-10-CM

## 2017-09-01 ENCOUNTER — Encounter: Payer: Self-pay | Admitting: Family Medicine

## 2017-09-18 ENCOUNTER — Ambulatory Visit: Payer: BC Managed Care – PPO | Admitting: Family Medicine

## 2017-09-18 ENCOUNTER — Encounter: Payer: Self-pay | Admitting: Family Medicine

## 2017-09-18 VITALS — BP 121/71 | HR 55 | Temp 97.0°F | Ht 69.0 in | Wt 314.0 lb

## 2017-09-18 DIAGNOSIS — S0502XA Injury of conjunctiva and corneal abrasion without foreign body, left eye, initial encounter: Secondary | ICD-10-CM

## 2017-09-18 MED ORDER — AMOXICILLIN 500 MG PO CAPS: 500.0000 mg | ORAL_CAPSULE | Freq: Three times a day (TID) | ORAL | 0 refills | Status: DC

## 2017-09-18 NOTE — Progress Notes (Signed)
Chief Complaint  Patient presents with  . left eye pink colored with pain    HPI  Patient presents today for 2 days of pain in the left eye with redness noted.  She was working out in her yard 2 days ago and notes that there was some significant wind.  She had fairly sudden onset of pain noted.  No mattering but she has had a lot of watering of the eye.  The right eye is been unaffected.  PMH: Smoking status noted ROS: Per HPI  Objective: BP 121/71   Pulse (!) 55   Temp (!) 97 F (36.1 C) (Oral)   Ht 5\' 9"  (1.753 m)   Wt (!) 314 lb (142.4 kg)   LMP 12/01/2011   BMI 46.37 kg/m  Gen: NAD, alert, cooperative with exam HEENT: NCAT, EOMI, PERRL.  Woods lamp exam reveals a thin line by 2 mm length corneal abrasion at 3:00 near the periphery of the left cornea.  No foreign body could be identified either by Woods lamp or by lid eversion and direct observation.  Neuro: Alert and oriented, No gross deficits  Assessment and plan:  1. Abrasion of left cornea, initial encounter     Meds ordered this encounter  Medications  . amoxicillin (AMOXIL) 500 MG capsule    Sig: Take 1 capsule (500 mg total) by mouth 3 (three) times daily.    Dispense:  15 capsule    Refill:  0    No orders of the defined types were placed in this encounter.   Follow up as needed.  Claretta Fraise, MD

## 2017-09-25 ENCOUNTER — Telehealth: Payer: Self-pay | Admitting: Family Medicine

## 2017-09-25 NOTE — Telephone Encounter (Signed)
Please  write and I will sign. Thanks, WS 

## 2017-09-25 NOTE — Telephone Encounter (Signed)
Patient seen Dr. Livia Snellen 6/3- please advise

## 2017-09-25 NOTE — Telephone Encounter (Signed)
Letter up front for patient to pick up- patient aware.

## 2017-10-23 ENCOUNTER — Other Ambulatory Visit: Payer: Self-pay | Admitting: Family Medicine

## 2017-10-23 DIAGNOSIS — I10 Essential (primary) hypertension: Secondary | ICD-10-CM

## 2017-11-13 ENCOUNTER — Ambulatory Visit: Payer: BC Managed Care – PPO

## 2017-11-14 ENCOUNTER — Ambulatory Visit
Admission: RE | Admit: 2017-11-14 | Discharge: 2017-11-14 | Disposition: A | Discharge status: Home / Self Care | Payer: BC Managed Care – PPO | Source: Ambulatory Visit | Attending: Hematology and Oncology | Admitting: Hematology and Oncology

## 2017-11-14 DIAGNOSIS — Z1231 Encounter for screening mammogram for malignant neoplasm of breast: Secondary | ICD-10-CM

## 2017-11-17 ENCOUNTER — Ambulatory Visit: Payer: BC Managed Care – PPO | Admitting: Family Medicine

## 2017-11-17 ENCOUNTER — Encounter: Payer: Self-pay | Admitting: Family Medicine

## 2017-11-17 VITALS — BP 154/85 | HR 82 | Temp 97.3°F | Ht 69.0 in | Wt 315.4 lb

## 2017-11-17 DIAGNOSIS — R0981 Nasal congestion: Secondary | ICD-10-CM

## 2017-11-17 MED ORDER — FLUTICASONE PROPIONATE 50 MCG/ACT NA SUSP: 1.0000 | Freq: Two times a day (BID) | NASAL | 6 refills | Status: DC | PRN

## 2017-11-17 NOTE — Progress Notes (Signed)
BP (!) 154/85   Pulse 82   Temp (!) 97.3 F (36.3 C) (Oral)   Ht 5\' 9"  (1.753 m)   Wt (!) 315 lb 6.4 oz (143.1 kg)   LMP 12/01/2011   BMI 46.58 kg/m    Subjective:    Patient ID: Kayla Neal, female    DOB: 09/17/1960, 57 y.o.   MRN: [id number]  HPI: Kayla Neal is a 57 y.o. female presenting on 11/17/2017 for Dizziness (Patient states that her BP dropped and she fainted x 2 weeks ago.  States that she was only out for a second and was awake when she hit the floor. BP -86/55. Since then she has had on and off dizzy spells.) and facial pressure (x 1 month - Has stopped her bp medication x 1 week since her bp was running low.)   HPI Headache and pressure in face dizziness Patient had one episode of dizziness about 2 weeks ago when she felt very lightheaded on a very hot day when she was outside a lot and did not take a lot of water and then her blood pressure was down on that day and then her blood pressure was down for a couple more days until it came back up with hydration.  She has not felt that way since.  Over the past 3 days she has felt some sinus pressure and some dizziness with the sinus pressure.  The pressure is just under both of her eyes and over the nasal bridge as well.  She has felt a little dizzy with this but she denies feeling lightheaded or passing out.  She has been using her Xyzal which she takes all the time for allergies.  She denies any fevers or chills or shortness of breath or wheezing.  She is having some nasal drainage but not significant.  Relevant past medical, surgical, family and social history reviewed and updated as indicated. Interim medical history since our last visit reviewed. Allergies and medications reviewed and updated.  Review of Systems  Constitutional: Negative for chills and fever.  HENT: Positive for congestion, postnasal drip and sinus pressure. Negative for ear discharge, ear pain, rhinorrhea, sneezing and sore throat.   Eyes: Negative  for pain, redness and visual disturbance.  Respiratory: Negative for cough, chest tightness and shortness of breath.   Cardiovascular: Negative for chest pain and leg swelling.  Musculoskeletal: Negative for back pain and gait problem.  Skin: Negative for rash.  Neurological: Positive for dizziness. Negative for headaches.  Psychiatric/Behavioral: Negative for agitation and behavioral problems.  All other systems reviewed and are negative.   Per HPI unless specifically indicated above   Allergies as of 11/17/2017      Reactions   Lisinopril Nausea And Vomiting, Other (See Comments)   Severe stomach cramps   Bee Pollen Hives   Has Hives with Bee Stings   Oxycodone Other (See Comments)   Patient prefers not to be on this med.  It makes her very loopy and unable to function.      Medication List        Accurate as of 11/17/17 11:11 AM. Always use your most recent med list.          albuterol 108 (90 Base) MCG/ACT inhaler Commonly known as:  VENTOLIN HFA INHALE ONE PUFF BY MOUTH DAILY AS NEEDED FOR ASTHMA SYMPTOMS   EPINEPHrine 0.3 mg/0.3 mL Soaj injection Commonly known as:  EPI-PEN Inject 0.3 mLs (0.3 mg total) into the muscle  once.   EVENING PRIMROSE OIL PO Take by mouth daily as needed.   fluticasone 50 MCG/ACT nasal spray Commonly known as:  FLONASE Place 1 spray into both nostrils 2 (two) times daily as needed for allergies or rhinitis.   Fluticasone-Salmeterol 100-50 MCG/DOSE Aepb Commonly known as:  ADVAIR Inhale 1 puff into the lungs 2 (two) times daily.   levocetirizine 5 MG tablet Commonly known as:  XYZAL Take 1 tablet (5 mg total) by mouth every evening.   Menthol (Topical Analgesic) 4 % Gel Commonly known as:  BIOFREEZE ROLL-ON Apply to affected area up to 4 times daily   naproxen sodium 220 MG tablet Commonly known as:  ALEVE Take 1 tablet (220 mg total) by mouth 2 (two) times daily as needed.   ONE-A-DAY MENOPAUSE FORMULA PO Take 1 tablet by  mouth daily.   Spacer/Aero Chamber Mouthpiece Misc 1 each by Does not apply route every 6 (six) hours as needed.   tamoxifen 20 MG tablet Commonly known as:  NOLVADEX Take 1 tablet (20 mg total) by mouth daily.   valsartan 160 MG tablet Commonly known as:  DIOVAN TAKE 1 TABLET BY MOUTH ONCE DAILY (NEEDS  TO  BE  SEEN  FOR  REFILLS)          Objective:    BP (!) 154/85   Pulse 82   Temp (!) 97.3 F (36.3 C) (Oral)   Ht 5\' 9"  (1.753 m)   Wt (!) 315 lb 6.4 oz (143.1 kg)   LMP 12/01/2011   BMI 46.58 kg/m   Wt Readings from Last 3 Encounters:  11/17/17 (!) 315 lb 6.4 oz (143.1 kg)  09/18/17 (!) 314 lb (142.4 kg)  05/25/17 (!) 352 lb (159.7 kg)    Physical Exam  Constitutional: She is oriented to person, place, and time. She appears well-developed and well-nourished. No distress.  HENT:  Right Ear: Tympanic membrane, external ear and ear canal normal.  Left Ear: Tympanic membrane, external ear and ear canal normal.  Nose: No mucosal edema or rhinorrhea. No epistaxis. Right sinus exhibits maxillary sinus tenderness. Right sinus exhibits no frontal sinus tenderness. Left sinus exhibits maxillary sinus tenderness. Left sinus exhibits no frontal sinus tenderness.  Mouth/Throat: Uvula is midline and mucous membranes are normal. Posterior oropharyngeal edema present. No oropharyngeal exudate, posterior oropharyngeal erythema or tonsillar abscesses.  Eyes: Conjunctivae and EOM are normal.  Cardiovascular: Normal rate, regular rhythm, normal heart sounds and intact distal pulses.  No murmur heard. Pulmonary/Chest: Effort normal and breath sounds normal. No respiratory distress. She has no wheezes.  Musculoskeletal: Normal range of motion. She exhibits no edema or tenderness.  Neurological: She is alert and oriented to person, place, and time. Coordination normal.  Skin: Skin is warm and dry. No rash noted. She is not diaphoretic.  Psychiatric: She has a normal mood and affect. Her  behavior is normal.  Vitals reviewed.       Assessment & Plan:   Problem List Items Addressed This Visit    None    Visit Diagnoses    Sinus congestion    -  Primary   Relevant Medications   fluticasone (FLONASE) 50 MCG/ACT nasal spray      Recommended Flonase for sinus pressure and dizziness, continue her Xyzal and taken Benadryl at in the evening to help the symptoms.  Call back if develops any fevers or worsens. Follow up plan: Return if symptoms worsen or fail to improve.  Counseling provided for all of the  vaccine components No orders of the defined types were placed in this encounter.   Caryl Pina, MD [facility] 11/17/2017, 11:11 AM

## 2017-12-05 ENCOUNTER — Other Ambulatory Visit: Payer: Self-pay | Admitting: Family Medicine

## 2017-12-05 DIAGNOSIS — I10 Essential (primary) hypertension: Secondary | ICD-10-CM

## 2017-12-06 NOTE — Telephone Encounter (Signed)
Need to be seen and have lab work for more refills.

## 2018-03-01 ENCOUNTER — Other Ambulatory Visit: Payer: Self-pay | Admitting: Hematology and Oncology

## 2018-03-01 DIAGNOSIS — C50211 Malignant neoplasm of upper-inner quadrant of right female breast: Secondary | ICD-10-CM

## 2018-03-05 ENCOUNTER — Telehealth: Payer: Self-pay | Admitting: Hematology and Oncology

## 2018-03-05 NOTE — Telephone Encounter (Signed)
Called patient per 11/18 sch message - no answer - left message for patient to call back to set up appt.

## 2018-03-06 ENCOUNTER — Other Ambulatory Visit: Payer: Self-pay | Admitting: Hematology and Oncology

## 2018-03-06 DIAGNOSIS — C50211 Malignant neoplasm of upper-inner quadrant of right female breast: Secondary | ICD-10-CM

## 2018-03-06 NOTE — Telephone Encounter (Signed)
Pt would like to schedule her annual appt because she has ran out of her tamoxifen medication. Pt had been calling for 2-3 weeks without anyone calling back from scheduling. Called pt to apologize for the inconvenience. Pt scheduled in the next 3 weeks and provided a 30 day refill on tamoxifen. PT confirmed time date.

## 2018-03-16 ENCOUNTER — Other Ambulatory Visit: Payer: Self-pay | Admitting: Family Medicine

## 2018-03-16 DIAGNOSIS — I10 Essential (primary) hypertension: Secondary | ICD-10-CM

## 2018-03-20 NOTE — Assessment & Plan Note (Signed)
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 Denies any hot flashes or myalgias 2. Complains of fatigue and weight gain  Breast Cancer Surveillance: 1. Breast exam 03/21/18: Normal 2. Mammogram 11/14/17 No abnormalities. Postsurgical changes. Breast Density Category A.  RTC in 1 year

## 2018-03-20 NOTE — Progress Notes (Signed)
Patient Care Team: Dettinger, Fransisca Kaufmann, MD as PCP - General (Family Medicine)  DIAGNOSIS:    ICD-10-CM   1. Carcinoma of upper-inner quadrant of right breast in female, estrogen receptor positive ([place]) C50.211    Z17.0   2. Carcinoma of upper-inner quadrant of right female breast, unspecified estrogen receptor status ([place]) C50.211 tamoxifen (NOLVADEX) 20 MG tablet    SUMMARY OF ONCOLOGIC HISTORY:   Carcinoma of upper-inner quadrant of right female breast ([place])   11/14/2011 Surgery    Rt Lumpectomy: DCIS high grade 2.4 cm, Er 100%, PR 80%    02/13/2012 - 03/30/2012 Radiation Therapy    Adj XRT by Dr.Palermo    05/28/2012 -  Anti-estrogen oral therapy    Tamoxifen 20 mg daily x 5 yrs     CHIEF COMPLIANT: Follow-up on Tamoxifen  INTERVAL HISTORY: Kayla Neal is a 57 y.o. 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 I last saw the patient three years ago. Her most recent mammogram on 11/14/17 showed no evidence of malignancy in either breast. She presents to the clinic today alone. She notes she is doing a lot better and has begun a weight loss plan with changing her eating plan and going to the gym. Her hot flashes have slightly improved and are tolerable. She denies breast discomfort or pain. About a month ago she was off Tamoxifen for three weeks and started again a week and a half ago and she has since experienced persistent acid reflux and she requested something to help quell it. She reviewed her medication list with me.   REVIEW OF SYSTEMS:   Constitutional: Denies fevers, chills or abnormal weight loss (+) hot flashes Eyes: Denies blurriness of vision Ears, nose, mouth, throat, and face: Denies mucositis or sore throat Respiratory: Denies cough, dyspnea or wheezes Cardiovascular: Denies palpitation, chest discomfort Gastrointestinal:  Denies nausea or change in bowel habits (+) acid reflux Skin: Denies abnormal skin rashes Lymphatics:  Denies new lymphadenopathy or easy bruising Neurological: Denies numbness, tingling or new weaknesses Behavioral/Psych: Mood is stable, no new changes  Extremities: No lower extremity edema Breast: denies any pain or lumps or nodules in either breasts All other systems were reviewed with the patient and are negative.  I have reviewed the past medical history, past surgical history, social history and family history with the patient and they are unchanged from previous note.  ALLERGIES:  is allergic to lisinopril; bee pollen; and oxycodone.  MEDICATIONS:  Current Outpatient Medications  Medication Sig Dispense Refill  . albuterol (VENTOLIN HFA) 108 (90 Base) MCG/ACT inhaler INHALE ONE PUFF BY MOUTH DAILY AS NEEDED FOR ASTHMA SYMPTOMS 1 each 2  . EPINEPHrine (EPI-PEN) 0.3 mg/0.3 mL DEVI Inject 0.3 mLs (0.3 mg total) into the muscle once. 1 Device 1  . EVENING PRIMROSE OIL PO Take by mouth daily as needed.     . fluticasone (FLONASE) 50 MCG/ACT nasal spray Place 1 spray into both nostrils 2 (two) times daily as needed for allergies or rhinitis. 16 g 6  . Fluticasone-Salmeterol (ADVAIR) 100-50 MCG/DOSE AEPB Inhale 1 puff into the lungs 2 (two) times daily. 1 each 3  . levocetirizine (XYZAL) 5 MG tablet Take 1 tablet (5 mg total) by mouth every evening. 90 tablet 3  . Menthol, Topical Analgesic, (BIOFREEZE ROLL-ON) 4 % GEL Apply to affected area up to 4 times daily 74 mL 11  . Multiple Vitamins-Minerals (ONE-A-DAY MENOPAUSE FORMULA PO) Take 1 tablet by mouth daily.     Marland Kitchen  naproxen sodium (ANAPROX) 220 MG tablet Take 1 tablet (220 mg total) by mouth 2 (two) times daily as needed. 60 tablet 11  . omeprazole (PRILOSEC) 20 MG capsule Take 1 capsule (20 mg total) by mouth daily. 30 capsule 0  . Spacer/Aero Chamber Mouthpiece MISC 1 each by Does not apply route every 6 (six) hours as needed. 1 each 0  . tamoxifen (NOLVADEX) 20 MG tablet Take 1 tablet (20 mg total) by mouth daily. 30 tablet 11  .  valsartan (DIOVAN) 160 MG tablet TAKE 1 TABLET BY MOUTH ONCE DAILY (NEEDS  TO  BE  SEEN  FOR  REFILLS) 30 tablet 0   No current facility-administered medications for this visit.     PHYSICAL EXAMINATION: ECOG PERFORMANCE STATUS: 1 - Symptomatic but completely ambulatory  Vitals:   03/21/18 1602  BP: 139/66  Pulse: 81  Resp: 18  Temp: 98.2 F (36.8 C)  SpO2: 99%   Filed Weights   03/21/18 1602  Weight: (!) 315 lb (142.9 kg)    GENERAL:alert, no distress and comfortable SKIN: skin color, texture, turgor are normal, no rashes or significant lesions EYES: normal, Conjunctiva are pink and non-injected, sclera clear OROPHARYNX:no exudate, no erythema and lips, buccal mucosa, and tongue normal  NECK: supple, thyroid normal size, non-tender, without nodularity LYMPH:  no palpable lymphadenopathy in the cervical, axillary or inguinal LUNGS: clear to auscultation and percussion with normal breathing effort HEART: regular rate & rhythm and no murmurs and no lower extremity edema ABDOMEN:abdomen soft, non-tender and normal bowel sounds MUSCULOSKELETAL:no cyanosis of digits and no clubbing  NEURO: alert & oriented x 3 with fluent speech, no focal motor/sensory deficits EXTREMITIES: No lower extremity edema BREAST: No palpable masses or nodules in either right or left breasts. No palpable axillary supraclavicular or infraclavicular adenopathy no breast tenderness or nipple discharge. (exam performed in the presence of a chaperone)  LABORATORY DATA:  I have reviewed the data as listed CMP Latest Ref Rng & Units 05/25/2017 06/30/2016 02/10/2015  Glucose 65 - 99 mg/dL 85 88 80  BUN 6 - 24 mg/dL 10 11 11.1  Creatinine 0.57 - 1.00 mg/dL 0.90 0.85 0.8  Sodium 134 - 144 mmol/L 141 137 139  Potassium 3.5 - 5.2 mmol/L 3.9 4.7 4.0  Chloride 96 - 106 mmol/L 102 97 -  CO2 20 - 29 mmol/L 26 26 26   Calcium 8.7 - 10.2 mg/dL 9.4 9.5 9.2  Total Protein 6.0 - 8.5 g/dL 7.2 7.0 7.1  Total Bilirubin 0.0 -  1.2 mg/dL 0.4 0.3 0.31  Alkaline Phos 39 - 117 IU/L 114 118(H) 120  AST 0 - 40 IU/L 15 18 14   ALT 0 - 32 IU/L 12 10 10     Lab Results  Component Value Date   WBC 5.6 06/30/2016   HGB 12.7 06/30/2016   HCT 39.3 06/30/2016   MCV 81 06/30/2016   PLT 248 06/30/2016   NEUTROABS 3.7 06/30/2016    ASSESSMENT & PLAN:  Carcinoma of upper-inner quadrant of right female breast ([place]) 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 Denies any hot flashes or myalgias Patient has not taken tamoxifen for 2 years in the middle. It appears that she is tolerating the tamoxifen fairly well since she started exercising and eating better. Because of this we will continue tamoxifen for 2 more years.  Breast Cancer Surveillance: 1. Breast exam 03/21/18: Normal 2. Mammogram 11/14/17 No abnormalities. Postsurgical  changes. Breast Density Category A.  Acid reflux: I sent a prescription for omeprazole. RTC in 1 year    No orders of the defined types were placed in this encounter.  The patient has a good understanding of the overall plan. she agrees with it. she will call with any problems that may develop before the next visit here.  Nicholas Lose, MD 03/21/2018   I, Molly Dorshimer, am acting as scribe for Nicholas Lose, MD.  I have reviewed the above documentation for accuracy and completeness, and I agree with the above.

## 2018-03-21 ENCOUNTER — Telehealth: Payer: Self-pay | Admitting: Hematology and Oncology

## 2018-03-21 ENCOUNTER — Inpatient Hospital Stay: Payer: BC Managed Care – PPO | Attending: Hematology and Oncology | Admitting: Hematology and Oncology

## 2018-03-21 DIAGNOSIS — Z79899 Other long term (current) drug therapy: Secondary | ICD-10-CM | POA: Diagnosis not present

## 2018-03-21 DIAGNOSIS — D0511 Intraductal carcinoma in situ of right breast: Secondary | ICD-10-CM | POA: Diagnosis present

## 2018-03-21 DIAGNOSIS — Z170 Estrogen receptor positive status [ER+]: Secondary | ICD-10-CM

## 2018-03-21 DIAGNOSIS — K219 Gastro-esophageal reflux disease without esophagitis: Secondary | ICD-10-CM

## 2018-03-21 DIAGNOSIS — C50211 Malignant neoplasm of upper-inner quadrant of right female breast: Secondary | ICD-10-CM

## 2018-03-21 MED ORDER — OMEPRAZOLE 20 MG PO CPDR: 20.0000 mg | DELAYED_RELEASE_CAPSULE | Freq: Every day | ORAL | 0 refills | Status: DC

## 2018-03-21 MED ORDER — TAMOXIFEN CITRATE 20 MG PO TABS: 20.0000 mg | ORAL_TABLET | Freq: Every day | ORAL | 11 refills | Status: DC

## 2018-03-21 NOTE — Telephone Encounter (Signed)
Gave patient avs report and appointments for December 2020.  °

## 2018-03-27 ENCOUNTER — Encounter: Payer: Self-pay | Admitting: Family Medicine

## 2018-03-27 ENCOUNTER — Ambulatory Visit: Payer: BC Managed Care – PPO | Admitting: Family Medicine

## 2018-03-27 VITALS — BP 154/80 | HR 72 | Temp 97.3°F | Ht 69.0 in | Wt 317.0 lb

## 2018-03-27 DIAGNOSIS — J0100 Acute maxillary sinusitis, unspecified: Secondary | ICD-10-CM | POA: Diagnosis not present

## 2018-03-27 DIAGNOSIS — K2100 Gastro-esophageal reflux disease with esophagitis, without bleeding: Secondary | ICD-10-CM

## 2018-03-27 DIAGNOSIS — K219 Gastro-esophageal reflux disease without esophagitis: Secondary | ICD-10-CM | POA: Insufficient documentation

## 2018-03-27 MED ORDER — AMOXICILLIN-POT CLAVULANATE 875-125 MG PO TABS: 1.0000 | ORAL_TABLET | Freq: Two times a day (BID) | ORAL | 0 refills | Status: DC

## 2018-03-27 NOTE — Progress Notes (Signed)
BP (!) 154/80   Pulse 72   Temp (!) 97.3 F (36.3 C) (Oral)   Ht 5\' 9"  (1.753 m)   Wt (!) 317 lb (143.8 kg)   LMP 12/01/2011   BMI 46.81 kg/m    Subjective:    Patient ID: Kayla Neal, female    DOB: August 03, 1960, 57 y.o.   MRN: [id number]  HPI: Kayla Neal is a 57 y.o. female presenting on 03/27/2018 for Cough (x 3 weeks on and off ); Headache; Nasal Congestion; facial pressure; and Gastroesophageal Reflux (x 1 month - Omeprazole is helping )   HPI Cough and nasal congestion and facial pressure Patient comes in complaining of cough nasal congestion and facial pressure is been going on for the past 3 weeks off and on but has been more persistent over the past week and is there more constantly.  She says especially under her eyes she is having a lot of facial pressure.  She had been using the Flonase and nasal saline and her usual allergy medications and it did not seem to be clearing and that is what brought her in today.  She denies any fevers or chills or shortness of breath or wheezing.  Patient has been diagnosed with GERD and acid reflux that is been increasingly bothering her over the past month.  She is to use generic Zantac to help with this but then it was not sufficient and she was prescribed omeprazole and she has been doing better on the omeprazole except for the occasional every now and then when she eats something that she is not supposed to.  She denies any nausea or vomiting or blood in her stool.  Relevant past medical, surgical, family and social history reviewed and updated as indicated. Interim medical history since our last visit reviewed. Allergies and medications reviewed and updated.  Review of Systems  Constitutional: Negative for chills and fever.  HENT: Positive for congestion, postnasal drip, rhinorrhea, sinus pressure, sneezing and sore throat. Negative for ear discharge and ear pain.   Eyes: Negative for pain, redness and visual disturbance.    Respiratory: Positive for cough. Negative for chest tightness and shortness of breath.   Cardiovascular: Negative for chest pain and leg swelling.  Gastrointestinal: Positive for abdominal pain. Negative for constipation, nausea, rectal pain and vomiting.  Musculoskeletal: Negative for back pain and gait problem.  Skin: Negative for rash.  Neurological: Negative for light-headedness and headaches.  Psychiatric/Behavioral: Negative for agitation and behavioral problems.  All other systems reviewed and are negative.   Per HPI unless specifically indicated above   Allergies as of 03/27/2018      Reactions   Lisinopril Nausea And Vomiting, Other (See Comments)   Severe stomach cramps   Bee Pollen Hives   Has Hives with Bee Stings   Oxycodone Other (See Comments)   Patient prefers not to be on this med.  It makes her very loopy and unable to function.      Medication List        Accurate as of 03/27/18  5:33 PM. Always use your most recent med list.          albuterol 108 (90 Base) MCG/ACT inhaler Commonly known as:  PROVENTIL HFA;VENTOLIN HFA INHALE ONE PUFF BY MOUTH DAILY AS NEEDED FOR ASTHMA SYMPTOMS   amoxicillin-clavulanate 875-125 MG tablet Commonly known as:  AUGMENTIN Take 1 tablet by mouth 2 (two) times daily.   EPINEPHrine 0.3 mg/0.3 mL Soaj injection Commonly known as:  EPI-PEN Inject 0.3 mLs (0.3 mg total) into the muscle once.   EVENING PRIMROSE OIL PO Take by mouth daily as needed.   fluticasone 50 MCG/ACT nasal spray Commonly known as:  FLONASE Place 1 spray into both nostrils 2 (two) times daily as needed for allergies or rhinitis.   Fluticasone-Salmeterol 100-50 MCG/DOSE Aepb Commonly known as:  ADVAIR Inhale 1 puff into the lungs 2 (two) times daily.   levocetirizine 5 MG tablet Commonly known as:  XYZAL Take 1 tablet (5 mg total) by mouth every evening.   Menthol (Topical Analgesic) 4 % Gel Apply to affected area up to 4 times daily    naproxen sodium 220 MG tablet Commonly known as:  ALEVE Take 1 tablet (220 mg total) by mouth 2 (two) times daily as needed.   omeprazole 20 MG capsule Commonly known as:  PRILOSEC Take 1 capsule (20 mg total) by mouth daily.   ONE-A-DAY MENOPAUSE FORMULA PO Take 1 tablet by mouth daily.   Spacer/Aero Chamber Mouthpiece Misc 1 each by Does not apply route every 6 (six) hours as needed.   tamoxifen 20 MG tablet Commonly known as:  NOLVADEX Take 1 tablet (20 mg total) by mouth daily.   valsartan 160 MG tablet Commonly known as:  DIOVAN TAKE 1 TABLET BY MOUTH ONCE DAILY (NEEDS  TO  BE  SEEN  FOR  REFILLS)          Objective:    BP (!) 154/80   Pulse 72   Temp (!) 97.3 F (36.3 C) (Oral)   Ht 5\' 9"  (1.753 m)   Wt (!) 317 lb (143.8 kg)   LMP 12/01/2011   BMI 46.81 kg/m   Wt Readings from Last 3 Encounters:  03/27/18 (!) 317 lb (143.8 kg)  03/21/18 (!) 315 lb (142.9 kg)  11/17/17 (!) 315 lb 6.4 oz (143.1 kg)    Physical Exam  Constitutional: She is oriented to person, place, and time. She appears well-developed and well-nourished. No distress.  HENT:  Right Ear: Tympanic membrane, external ear and ear canal normal.  Left Ear: Tympanic membrane, external ear and ear canal normal.  Nose: Mucosal edema present. No rhinorrhea. No epistaxis. Right sinus exhibits maxillary sinus tenderness. Right sinus exhibits no frontal sinus tenderness. Left sinus exhibits maxillary sinus tenderness. Left sinus exhibits no frontal sinus tenderness.  Mouth/Throat: Uvula is midline and mucous membranes are normal. Posterior oropharyngeal edema present. No oropharyngeal exudate, posterior oropharyngeal erythema or tonsillar abscesses.  Eyes: Conjunctivae are normal.  Cardiovascular: Normal rate, regular rhythm, normal heart sounds and intact distal pulses.  No murmur heard. Pulmonary/Chest: Effort normal and breath sounds normal. No respiratory distress. She has no wheezes.  Abdominal:  Soft. Bowel sounds are normal. She exhibits no distension and no mass. There is tenderness (Mild epigastric tenderness). There is no guarding.  Musculoskeletal: Normal range of motion. She exhibits no edema or tenderness.  Neurological: She is alert and oriented to person, place, and time. Coordination normal.  Skin: Skin is warm and dry. No rash noted. She is not diaphoretic.  Psychiatric: She has a normal mood and affect. Her behavior is normal.  Nursing note and vitals reviewed.       Assessment & Plan:   Problem List Items Addressed This Visit      Digestive   GERD (gastroesophageal reflux disease)    Other Visit Diagnoses    Acute non-recurrent maxillary sinusitis    -  Primary   Relevant Medications   amoxicillin-clavulanate (  AUGMENTIN) 875-125 MG tablet      Seems like patient has a sinus infection, will go ahead and treat her for the sinus infection with Augmentin and she is to continue her current regimen of the nasal sprays and her allergy medication.  Continue omeprazole for GERD Follow up plan: Return if symptoms worsen or fail to improve.  Counseling provided for all of the vaccine components No orders of the defined types were placed in this encounter.   Caryl Pina, MD [facility] 03/27/2018, 5:33 PM

## 2018-04-03 ENCOUNTER — Other Ambulatory Visit: Payer: Self-pay | Admitting: Pediatrics

## 2018-04-03 ENCOUNTER — Other Ambulatory Visit: Payer: Self-pay | Admitting: Family Medicine

## 2018-04-03 DIAGNOSIS — J302 Other seasonal allergic rhinitis: Secondary | ICD-10-CM

## 2018-04-03 DIAGNOSIS — I10 Essential (primary) hypertension: Secondary | ICD-10-CM

## 2018-04-03 DIAGNOSIS — J4530 Mild persistent asthma, uncomplicated: Secondary | ICD-10-CM

## 2018-04-04 ENCOUNTER — Other Ambulatory Visit: Payer: Self-pay | Admitting: Family Medicine

## 2018-04-04 ENCOUNTER — Ambulatory Visit: Payer: BC Managed Care – PPO | Admitting: Family Medicine

## 2018-05-04 ENCOUNTER — Ambulatory Visit: Payer: BC Managed Care – PPO | Admitting: Family Medicine

## 2018-05-04 ENCOUNTER — Encounter: Payer: Self-pay | Admitting: Family Medicine

## 2018-05-04 VITALS — BP 161/89 | HR 69 | Temp 96.7°F | Ht 69.0 in | Wt 320.0 lb

## 2018-05-04 DIAGNOSIS — K2100 Gastro-esophageal reflux disease with esophagitis, without bleeding: Secondary | ICD-10-CM

## 2018-05-04 MED ORDER — OMEPRAZOLE 20 MG PO CPDR: 20.0000 mg | DELAYED_RELEASE_CAPSULE | Freq: Every day | ORAL | 0 refills | Status: DC

## 2018-05-04 NOTE — Progress Notes (Signed)
BP (!) 161/89   Pulse 69   Temp (!) 96.7 F (35.9 C) (Oral)   Ht 5\' 9"  (1.753 m)   Wt (!) 145.2 kg   LMP 12/01/2011   SpO2 100%   BMI 47.26 kg/m    Subjective:    Patient ID: Kayla Neal, female    DOB: 07-03-1960, 58 y.o.   MRN: [id number]  HPI: Kayla Neal is a 58 y.o. female presenting on 05/04/2018 for Gastroesophageal Reflux (seen 12/10 and states no better); Shortness of Breath (x 1 week); and Sinus Problem   HPI Patient is coming in for GERD. She was seen on 03/27/18 and given 1 month of omeprazole. It helped, but symptoms returned about 1 week after she stopped. She was also treated with antibiotics for sinus congestion at that time, but the sinus pressure has returned and is bothersome every few days. She describes an epigastric burning pain that travels up her chest and a globus sensation in her throat. She recently had a severe episode after eating tortilla soup where she believed she was having a heart attack. She took Ranitidine which helped. She stopped taking the Flonase because it gave her a headache, and the Xyzal often dries her eyes out so she doesn't take it everyday. The reflux also makes her feel like she can't take a deep breath without coughing so she has started taking her albuterol, which she normally only takes in Spring.  Relevant past medical, surgical, family and social history reviewed and updated as indicated. Interim medical history since our last visit reviewed. Allergies and medications reviewed and updated.  Review of Systems  HENT: Positive for congestion, postnasal drip and sinus pressure. Negative for ear pain and sore throat.   Respiratory: Positive for cough. Negative for wheezing.   Gastrointestinal: Positive for abdominal pain (epigastric). Negative for diarrhea and nausea.  Neurological: Positive for dizziness.    Per HPI unless specifically indicated above      Objective:    BP (!) 161/89   Pulse 69   Temp (!) 96.7 F (35.9 C)  (Oral)   Ht 5\' 9"  (1.753 m)   Wt (!) 145.2 kg   LMP 12/01/2011   SpO2 100%   BMI 47.26 kg/m   Wt Readings from Last 3 Encounters:  05/04/18 (!) 145.2 kg  03/27/18 (!) 143.8 kg  03/21/18 (!) 142.9 kg    Physical Exam Constitutional:      General: She is not in acute distress. HENT:     Mouth/Throat:     Mouth: Mucous membranes are moist.     Pharynx: Oropharynx is clear. No oropharyngeal exudate.  Neck:     Thyroid: No thyromegaly.  Cardiovascular:     Rate and Rhythm: Normal rate and regular rhythm.  Pulmonary:     Effort: Pulmonary effort is normal.     Breath sounds: Normal breath sounds.  Abdominal:     Palpations: Abdomen is soft.     Tenderness: There is no abdominal tenderness.       Assessment & Plan:   Patient was educated about the potential of GERD to cause sinus congestion and nasopharyngeal irritation. We discussed the need to continue monitoring calcium levels with PPI use.  Problem List Items Addressed This Visit      Digestive   GERD (gastroesophageal reflux disease) - Primary   Relevant Medications   omeprazole (PRILOSEC) 20 MG capsule       Follow up plan: Return if symptoms worsen or  fail to improve.  Counseling provided for all of the vaccine components No orders of the defined types were placed in this encounter.   [place], PA-S [facility] 05/04/2018, 1:48 PM Patient seen and examined with PA student, agree with assessment and plan above.  Believe that all of her congestion and breathing troubles and sinus troubles that she has been having are because she has uncontrolled acid reflux.  She says she did do better when she was on the reflux medication.

## 2018-05-11 ENCOUNTER — Other Ambulatory Visit: Payer: Self-pay | Admitting: Hematology and Oncology

## 2018-05-11 DIAGNOSIS — C50211 Malignant neoplasm of upper-inner quadrant of right female breast: Secondary | ICD-10-CM

## 2018-05-22 ENCOUNTER — Telehealth: Payer: Self-pay | Admitting: Family Medicine

## 2018-05-22 MED ORDER — HYDROCHLOROTHIAZIDE 25 MG PO TABS: 25.0000 mg | ORAL_TABLET | Freq: Every day | ORAL | 3 refills | Status: DC

## 2018-05-22 NOTE — Telephone Encounter (Signed)
160/90 140/95 Are averages  Only rarely normal range She has been fighting a cold - no otc meds  Having HAs some On the Valsartan 160 one a day.

## 2018-05-22 NOTE — Telephone Encounter (Signed)
Pt aware and 2 wk appt made

## 2018-05-22 NOTE — Telephone Encounter (Signed)
I sent hydrochlorothiazide for her, she needs to come in in 2 weeks for a blood pressure check and labs Caryl Pina, MD [employer] 05/22/2018, 1:46 PM

## 2018-05-28 ENCOUNTER — Encounter (HOSPITAL_COMMUNITY): Payer: Self-pay

## 2018-05-28 ENCOUNTER — Observation Stay (HOSPITAL_COMMUNITY)
Admission: EM | Admit: 2018-05-28 | Discharge: 2018-05-29 | Disposition: A | Discharge status: Home / Self Care | Payer: BC Managed Care – PPO | Attending: Family Medicine | Admitting: Family Medicine

## 2018-05-28 ENCOUNTER — Observation Stay (HOSPITAL_COMMUNITY)
Admit: 2018-05-28 | Discharge: 2018-05-28 | Disposition: A | Discharge status: Home / Self Care | Payer: BC Managed Care – PPO | Attending: Family Medicine | Admitting: Family Medicine

## 2018-05-28 ENCOUNTER — Observation Stay (HOSPITAL_BASED_OUTPATIENT_CLINIC_OR_DEPARTMENT_OTHER): Payer: BC Managed Care – PPO

## 2018-05-28 ENCOUNTER — Observation Stay (HOSPITAL_COMMUNITY): Payer: BC Managed Care – PPO

## 2018-05-28 ENCOUNTER — Emergency Department (HOSPITAL_COMMUNITY): Payer: BC Managed Care – PPO

## 2018-05-28 DIAGNOSIS — R9431 Abnormal electrocardiogram [ECG] [EKG]: Secondary | ICD-10-CM | POA: Insufficient documentation

## 2018-05-28 DIAGNOSIS — Z79899 Other long term (current) drug therapy: Secondary | ICD-10-CM | POA: Insufficient documentation

## 2018-05-28 DIAGNOSIS — R55 Syncope and collapse: Principal | ICD-10-CM | POA: Diagnosis present

## 2018-05-28 DIAGNOSIS — M6281 Muscle weakness (generalized): Secondary | ICD-10-CM | POA: Insufficient documentation

## 2018-05-28 DIAGNOSIS — I10 Essential (primary) hypertension: Secondary | ICD-10-CM | POA: Diagnosis present

## 2018-05-28 DIAGNOSIS — I6523 Occlusion and stenosis of bilateral carotid arteries: Secondary | ICD-10-CM | POA: Diagnosis not present

## 2018-05-28 DIAGNOSIS — Z888 Allergy status to other drugs, medicaments and biological substances status: Secondary | ICD-10-CM | POA: Insufficient documentation

## 2018-05-28 DIAGNOSIS — Z8249 Family history of ischemic heart disease and other diseases of the circulatory system: Secondary | ICD-10-CM | POA: Diagnosis not present

## 2018-05-28 DIAGNOSIS — K219 Gastro-esophageal reflux disease without esophagitis: Secondary | ICD-10-CM | POA: Diagnosis not present

## 2018-05-28 DIAGNOSIS — Z885 Allergy status to narcotic agent status: Secondary | ICD-10-CM | POA: Insufficient documentation

## 2018-05-28 DIAGNOSIS — R2689 Other abnormalities of gait and mobility: Secondary | ICD-10-CM | POA: Insufficient documentation

## 2018-05-28 DIAGNOSIS — K210 Gastro-esophageal reflux disease with esophagitis: Secondary | ICD-10-CM | POA: Diagnosis not present

## 2018-05-28 DIAGNOSIS — R2681 Unsteadiness on feet: Secondary | ICD-10-CM | POA: Insufficient documentation

## 2018-05-28 DIAGNOSIS — J45909 Unspecified asthma, uncomplicated: Secondary | ICD-10-CM | POA: Diagnosis not present

## 2018-05-28 LAB — CBC WITH DIFFERENTIAL/PLATELET
Abs Immature Granulocytes: 0.03 10*3/uL (ref 0.00–0.07)
BASOS PCT: 0 %
Basophils Absolute: 0 10*3/uL (ref 0.0–0.1)
Eosinophils Absolute: 0.1 10*3/uL (ref 0.0–0.5)
Eosinophils Relative: 1 %
HCT: 45.2 % (ref 36.0–46.0)
Hemoglobin: 13.5 g/dL (ref 12.0–15.0)
Immature Granulocytes: 0 %
Lymphocytes Relative: 7 %
Lymphs Abs: 0.6 10*3/uL — ABNORMAL LOW (ref 0.7–4.0)
MCH: 25.6 pg — ABNORMAL LOW (ref 26.0–34.0)
MCHC: 29.9 g/dL — AB (ref 30.0–36.0)
MCV: 85.8 fL (ref 80.0–100.0)
Monocytes Absolute: 0.5 10*3/uL (ref 0.1–1.0)
Monocytes Relative: 6 %
Neutro Abs: 7.6 10*3/uL (ref 1.7–7.7)
Neutrophils Relative %: 86 %
Platelets: 177 10*3/uL (ref 150–400)
RBC: 5.27 MIL/uL — ABNORMAL HIGH (ref 3.87–5.11)
RDW: 13.8 % (ref 11.5–15.5)
WBC: 8.8 10*3/uL (ref 4.0–10.5)
nRBC: 0 % (ref 0.0–0.2)

## 2018-05-28 LAB — LACTIC ACID, PLASMA
LACTIC ACID, VENOUS: 0.7 mmol/L (ref 0.5–1.9)
Lactic Acid, Venous: 0.9 mmol/L (ref 0.5–1.9)

## 2018-05-28 LAB — ECHOCARDIOGRAM COMPLETE
Height: 69 in
Weight: 5152 oz

## 2018-05-28 LAB — TROPONIN I
Troponin I: <0.03 ng/mL (ref <0.03)
Troponin I: <0.03 ng/mL (ref <0.03)

## 2018-05-28 LAB — COMPREHENSIVE METABOLIC PANEL
ALT: 19 U/L (ref 0–44)
ANION GAP: 9 (ref 5–15)
AST: 22 U/L (ref 15–41)
Albumin: 4.2 g/dL (ref 3.5–5.0)
Alkaline Phosphatase: 92 U/L (ref 38–126)
BUN: 17 mg/dL (ref 6–20)
CO2: 28 mmol/L (ref 22–32)
Calcium: 9.1 mg/dL (ref 8.9–10.3)
Chloride: 100 mmol/L (ref 98–111)
Creatinine, Ser: 0.93 mg/dL (ref 0.44–1.00)
GFR calc Af Amer: >60 mL/min (ref >60)
GFR calc non Af Amer: >60 mL/min (ref >60)
Glucose, Bld: 122 mg/dL — ABNORMAL HIGH (ref 70–99)
Potassium: 3.9 mmol/L (ref 3.5–5.1)
Sodium: 137 mmol/L (ref 135–145)
Total Bilirubin: 0.7 mg/dL (ref 0.3–1.2)
Total Protein: 8.1 g/dL (ref 6.5–8.1)

## 2018-05-28 LAB — URINALYSIS, ROUTINE W REFLEX MICROSCOPIC
Bilirubin Urine: NEGATIVE
Glucose, UA: NEGATIVE mg/dL
Hgb urine dipstick: NEGATIVE
Ketones, ur: NEGATIVE mg/dL
LEUKOCYTES UA: NEGATIVE
Nitrite: NEGATIVE
Protein, ur: NEGATIVE mg/dL
Specific Gravity, Urine: 1.015 (ref 1.005–1.030)
pH: 6 (ref 5.0–8.0)

## 2018-05-28 LAB — HEMOGLOBIN A1C
Hgb A1c MFr Bld: 5.7 % — ABNORMAL HIGH (ref 4.8–5.6)
Mean Plasma Glucose: 116.89 mg/dL

## 2018-05-28 LAB — D-DIMER, QUANTITATIVE: D-Dimer, Quant: 0.31 ug/mL-FEU (ref 0.00–0.50)

## 2018-05-28 LAB — TSH: TSH: 0.92 u[IU]/mL (ref 0.350–4.500)

## 2018-05-28 MED ORDER — ONDANSETRON HCL 4 MG/2ML IJ SOLN: 4.0000 mg | Freq: Four times a day (QID) | INTRAMUSCULAR | Status: DC | PRN

## 2018-05-28 MED ORDER — ENOXAPARIN SODIUM 80 MG/0.8ML ~~LOC~~ SOLN
70.0000 mg | SUBCUTANEOUS | Status: DC
  Administered 2018-05-28: 70 mg via SUBCUTANEOUS
  Filled 2018-05-28: qty 0.8

## 2018-05-28 MED ORDER — TRAZODONE HCL 50 MG PO TABS: 25.0000 mg | ORAL_TABLET | Freq: Every evening | ORAL | Status: DC | PRN

## 2018-05-28 MED ORDER — ACETAMINOPHEN 325 MG PO TABS: 650.0000 mg | ORAL_TABLET | Freq: Four times a day (QID) | ORAL | Status: DC | PRN

## 2018-05-28 MED ORDER — PANTOPRAZOLE SODIUM 40 MG PO TBEC
40.0000 mg | DELAYED_RELEASE_TABLET | Freq: Every day | ORAL | Status: DC
  Administered 2018-05-29: 40 mg via ORAL
  Filled 2018-05-28: qty 1

## 2018-05-28 MED ORDER — IRBESARTAN 75 MG PO TABS
75.0000 mg | ORAL_TABLET | Freq: Every day | ORAL | Status: DC
  Administered 2018-05-29 (×2): 75 mg via ORAL
  Filled 2018-05-28 (×4): qty 1

## 2018-05-28 MED ORDER — ONDANSETRON HCL 4 MG PO TABS: 4.0000 mg | ORAL_TABLET | Freq: Four times a day (QID) | ORAL | Status: DC | PRN

## 2018-05-28 MED ORDER — ENOXAPARIN SODIUM 40 MG/0.4ML ~~LOC~~ SOLN: 40.0000 mg | SUBCUTANEOUS | Status: DC

## 2018-05-28 MED ORDER — SODIUM CHLORIDE 0.9% FLUSH
3.0000 mL | Freq: Two times a day (BID) | INTRAVENOUS | Status: DC
  Administered 2018-05-28: 3 mL via INTRAVENOUS

## 2018-05-28 MED ORDER — SODIUM CHLORIDE 0.9 % IV SOLN
INTRAVENOUS | Status: DC
  Administered 2018-05-28: 23:00:00 via INTRAVENOUS

## 2018-05-28 MED ORDER — NAPROXEN SODIUM 220 MG PO TABS: 220.0000 mg | ORAL_TABLET | Freq: Two times a day (BID) | ORAL | Status: DC | PRN

## 2018-05-28 MED ORDER — NAPROXEN 250 MG PO TABS: 250.0000 mg | ORAL_TABLET | Freq: Two times a day (BID) | ORAL | Status: DC | PRN

## 2018-05-28 MED ORDER — ACETAMINOPHEN 650 MG RE SUPP: 650.0000 mg | Freq: Four times a day (QID) | RECTAL | Status: DC | PRN

## 2018-05-28 NOTE — H&P (Signed)
History and Physical  Kayla Neal RXV:[id number] DOB: 09/21/1960 DOA: 05/28/2018  Referring physician: Thurnell Garbe   PCP: Dettinger, Fransisca Kaufmann, MD   Chief Complaint: passed out   HPI: Kayla Neal is a 58 y.o. female with hypertension, history of anemia, history of right breast cancer presented to the emergency department earlier this morning after having an episode where she passed out completely.  She said that she had gotten up early in the morning was getting ready for work washing her face and putting in contact lenses and she began to feel hot and cold spells and lightheaded.  She then went to sit down on the edge of her bed and then woke up on the floor.  She was out for several minutes.  She denies having headache.  She denies injury to the tongue.  She denies chest pain and palpitations.  She says she did have dizziness and lightheaded prior to the event.  The patient says she has passed out several years ago but it was associated with anemia.  She has not had any recent problems with anemia.  She denies any other symptoms.  She denies chest pain or shortness of breath neck pain back pain and sore throat and cough.  ED Course: The patient was evaluated in the emergency department and she was noted to have normal vital signs.  Her EKG was not revealing any acute changes.  Her initial labs did not show any acute findings.  She will be admitted for further observation for syncopal episode.  Review of Systems: All systems reviewed and apart from history of presenting illness, are negative.  Past Medical History:  Diagnosis Date  . Allergy    LISINOPRIL=N,V  . Anemia 10/18/2011   placed on iron tid for Hgb of 8  . Arthritis    knee pain-uses acetaminophen for pain control prn  . Asthma    seasonal allergies related-prn albuterol  . 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    . Cervical polyp   . Fibroids    MULTIPLE IN  ENDOMETRIAL CAVITY  . GERD (gastroesophageal reflux disease)    no meds required in past 3 years  . Hypertension    under control without medication as of 11/11/11-no meds for  at least 1 yr  . Nasal congestion   . Personal history of radiation therapy 02/2012  . Submucous myoma of uterus   . Use of megestrol acetate (Megace)    For uterine bleeding  . Uterine bleeding, dysfunctional    severe  . Wheezing    hx  - at allergy season only   Past Surgical History:  Procedure Laterality Date  . BREAST BIOPSY  11/14/2011   Procedure: BREAST BIOPSY WITH NEEDLE LOCALIZATION;  Surgeon: Haywood Lasso, MD;  Location: [facility];  Service: General;  Laterality: Right;  needle localization right breast calcifications  . BREAST LUMPECTOMY  11/14/11   RIGHT  BREAST=DUCTAL CA IN SITU,W/CALCIFICATIONS,HIGH GRADE,2.4CM,SURGICAL RESECTION MARGINS NEG,  DR. C. STRECK  . CERVIX POLYP  10/17/11   NO MALIGNANCY  . ENDOMETRIAL BIOPSY  12/09/11   UTERUS=BENIGN ENDOMETRIAL GLANDS,BENIGN SQUAMOUS EPITHELIAL CELLS,NO DYSPLASIA OR MALIGNANCY  . TONSILLECTOMY    . TOOTH EXTRACTION  2000  . VAGINAL HYSTERECTOMY  01/26/2012   Procedure: HYSTERECTOMY VAGINAL;  Surgeon: Bennetta Laos, MD;  Location: [facility];  Service: Gynecology;  Laterality: N/A;  . VAGINAL HYSTERECTOMY  Social History:  reports that she has never smoked. She has never used smokeless tobacco. She reports current alcohol use. She reports that she does not use drugs.  Allergies  Allergen Reactions  . Lisinopril Nausea And Vomiting and Other (See Comments)    Severe stomach cramps  . Bee Pollen Hives    Has Hives with Bee Stings  . Oxycodone Other (See Comments)    Patient prefers not to be on this med.  It makes her very loopy and unable to function.    Family History  Problem Relation Age of Onset  . Diabetes Mother   . Hypertension Mother   . Heart disease Mother   . Colon cancer Mother 29  . Hypertension Father    . ALS Father 59  . Breast cancer Maternal Aunt        Age 7's  . Ovarian cancer Paternal Aunt 36  . Breast cancer Paternal Aunt        dx in her 14s  . Breast cancer Cousin        3 paternal cousins with breast cancer >50  . Hypertension Brother     Prior to Admission medications   Medication Sig Start Date End Date Taking? Authorizing Provider  albuterol (PROVENTIL HFA;VENTOLIN HFA) 108 (90 Base) MCG/ACT inhaler INHALE 1 PUFF INTO LUNGS ONCE DAILY AS NEEDED FOR ASTHMA 04/04/18  Yes Dettinger, Fransisca Kaufmann, MD  EPINEPHrine (EPI-PEN) 0.3 mg/0.3 mL DEVI Inject 0.3 mLs (0.3 mg total) into the muscle once. 09/20/12  Yes [place], Mae E, FNP  Fluticasone-Salmeterol (ADVAIR) 100-50 MCG/DOSE AEPB INHALE 1 DOSE BY MOUTH TWICE DAILY 04/04/18  Yes Dettinger, Fransisca Kaufmann, MD  hydrochlorothiazide (HYDRODIURIL) 25 MG tablet Take 1 tablet (25 mg total) by mouth daily. 05/22/18  Yes Dettinger, Fransisca Kaufmann, MD  levocetirizine (XYZAL) 5 MG tablet TAKE 1 TABLET BY MOUTH IN THE EVENING 04/04/18  Yes Dettinger, Fransisca Kaufmann, MD  Menthol, Topical Analgesic, 4 % GEL APPLY TO AFFECTED AREA UP TO 4 TIMES DAILY 04/04/18  Yes Dettinger, Fransisca Kaufmann, MD  naproxen sodium (ANAPROX) 220 MG tablet Take 1 tablet (220 mg total) by mouth 2 (two) times daily as needed. 11/04/16  Yes Dettinger, Fransisca Kaufmann, MD  omeprazole (PRILOSEC) 20 MG capsule Take 1 capsule (20 mg total) by mouth daily. 05/04/18  Yes Dettinger, Fransisca Kaufmann, MD  Spacer/Aero Chamber Mouthpiece MISC 1 each by Does not apply route every 6 (six) hours as needed. 02/08/17  Yes Eustaquio Maize, MD  valsartan (DIOVAN) 160 MG tablet TAKE 1 TABLET BY MOUTH ONCE DAILY (NEEDS  TO  BE  SEEN) 04/04/18  Yes Dettinger, Fransisca Kaufmann, MD  EVENING PRIMROSE OIL PO Take by mouth daily as needed.     [provider]   Physical Exam: Vitals:   05/28/18 0830 05/28/18 0946 05/28/18 1018 05/28/18 1030  BP: 129/66 127/81 135/65 130/65  Pulse: 80 69 75 76  Resp:  17 14 12   Temp:  98.4 F (36.9  C)    TempSrc:  Oral    SpO2: 99% 95% 99% 99%  Weight:      Height:        General exam: Moderately built and nourished patient, lying comfortably supine on the gurney in no obvious distress.  Head, eyes and ENT: Nontraumatic and normocephalic. Pupils equally reacting to light and accommodation. Oral mucosa dry.  Neck: Supple. No JVD, carotid bruit or thyromegaly.  Lymphatics: No lymphadenopathy.  Respiratory system: Clear to auscultation. No increased work of  breathing.  Cardiovascular system: S1 and S2 heard, RRR. No JVD, murmurs, gallops, clicks or pedal edema.  Gastrointestinal system: Abdomen is nondistended, soft and nontender. Normal bowel sounds heard. No organomegaly or masses appreciated.  Central nervous system: Alert and oriented. No focal neurological deficits.  Extremities: Symmetric 5 x 5 power. Peripheral pulses symmetrically felt.   Skin: No rashes or acute findings.  Musculoskeletal system: Negative exam.  Psychiatry: Pleasant and cooperative.  Labs on Admission:  Basic Metabolic Panel: Recent Labs  Lab 05/28/18 0927  NA 137  K 3.9  CL 100  CO2 28  GLUCOSE 122*  BUN 17  CREATININE 0.93  CALCIUM 9.1   Liver Function Tests: Recent Labs  Lab 05/28/18 0927  AST 22  ALT 19  ALKPHOS 92  BILITOT 0.7  PROT 8.1  ALBUMIN 4.2   No results for input(s): LIPASE, AMYLASE in the last 168 hours. No results for input(s): AMMONIA in the last 168 hours. CBC: Recent Labs  Lab 05/28/18 0927  WBC 8.8  NEUTROABS 7.6  HGB 13.5  HCT 45.2  MCV 85.8  PLT 177   Cardiac Enzymes: Recent Labs  Lab 05/28/18 0927  TROPONINI <0.03    BNP (last 3 results) No results for input(s): PROBNP in the last 8760 hours. CBG: No results for input(s): GLUCAP in the last 168 hours.  Radiological Exams on Admission: Dg Chest 2 View  Result Date: 05/28/2018 CLINICAL DATA:  Near syncope today. EXAM: CHEST - 2 VIEW COMPARISON:  None. FINDINGS: Lungs are adequately  inflated without consolidation or effusion. Cardiomediastinal silhouette and remainder of the exam is unremarkable. IMPRESSION: No active cardiopulmonary disease. Electronically Signed   By: Marin Olp M.D.   On: 05/28/2018 09:36   Ct Head Wo Contrast  Result Date: 05/28/2018 CLINICAL DATA:  Syncope EXAM: CT HEAD WITHOUT CONTRAST TECHNIQUE: Contiguous axial images were obtained from the base of the skull through the vertex without intravenous contrast. COMPARISON:  None. FINDINGS: Brain: No evidence of acute infarction, hemorrhage, hydrocephalus, extra-axial collection or mass lesion/mass effect. Vascular: No hyperdense vessel or unexpected calcification. Skull: Normal. Negative for fracture or focal lesion. Sinuses/Orbits: No acute finding. Other: None. IMPRESSION: No acute intracranial pathology. Electronically Signed   By: Eddie Candle M.D.   On: 05/28/2018 09:30   EKG: Independently reviewed. Normal sinus rhythm.   Assessment/Plan Principal Problem:   Syncope and collapse Active Problems:   Essential hypertension   GERD (gastroesophageal reflux disease)   1. Syncope and collapse- patient being admitted for observation, continuous telemetry monitoring, 2D echocardiogram, carotid Doppler studies, EEG and IV fluids.  Neurochecks ordered.  Follow clinically.  I have asked for PT evaluation.  Hopefully home tomorrow if work-up is unrevealing.  Will have her follow-up outpatient for further work-up.   2. Essential hypertension -blood pressure well controlled we are resuming home blood pressure medications. 3. GERD we will order Protonix for GI protection.  DVT Prophylaxis: Lovenox Code Status: Full Family Communication: husband at bedside Disposition Plan: Home when medically stabilized  Time spent: 95 minutes  Shraddha Lebron Wynetta Emery, MD [facility] How to contact the [facility] Attending or Consulting provider [place] or covering provider during after hours [place], for this patient?   1. Check the care team in [facility] and look for a) attending/consulting [facility] provider listed and b) the [facility] team listed 2. Log into [email or web address] and use [place]'s universal password to access. If you do not have the password, please contact the hospital operator. 3. Locate  the [facility] provider you are looking for under [facility] and page to a number that you can be directly reached. 4. If you still have difficulty reaching the provider, please page the [facility] (Director on Call) for the Hospitalists listed on amion for assistance.

## 2018-05-28 NOTE — ED Provider Notes (Signed)
[facility] EMERGENCY DEPARTMENT Provider Note   CSN: [id number] Arrival date & time: 05/28/18  3762     History   Chief Complaint Chief Complaint  Patient presents with  . Weakness  . Loss of Consciousness    HPI Kayla Neal is a 58 y.o. female.  HPI  Pt was seen at [facility]. Per pt, c/o sudden onset and resolution of one episode of syncope that occurred PTA. Pt states she was up, getting ready for work (washing face, putting in contact lenses), before her symptoms began. Pt states she sat on the edge of her bed, felt "hot and cold" and "lightheaded." Pt states she then "woke up on the floor." Denies AMS, no neck or back pain, no N/V/D, no abd pain, no CP/palpitations, no SOB/cough, no focal motor weakness, no tingling/numbness in extremities, no sore throat, no cough.     Past Medical History:  Diagnosis Date  . Allergy    LISINOPRIL=N,V  . Anemia 10/18/2011   placed on iron tid for Hgb of 8  . Arthritis    knee pain-uses acetaminophen for pain control prn  . Asthma    seasonal allergies related-prn albuterol  . 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    . Cervical polyp   . Fibroids    MULTIPLE IN ENDOMETRIAL CAVITY  . GERD (gastroesophageal reflux disease)    no meds required in past 3 years  . Hypertension    under control without medication as of 11/11/11-no meds for  at least 1 yr  . Nasal congestion   . Personal history of radiation therapy 02/2012  . Submucous myoma of uterus   . Use of megestrol acetate (Megace)    For uterine bleeding  . Uterine bleeding, dysfunctional    severe  . Wheezing    hx  - at allergy season only    Patient Active Problem List   Diagnosis Date Noted  . GERD (gastroesophageal reflux disease) 03/27/2018  . Essential hypertension 02/02/2015  . Iron deficiency anemia 09/18/2012  . Carcinoma of upper-inner quadrant of right female breast ([place]) 12/16/2011  . Cervical polyp     . Obesities 11/11/2011  . Anemia   . Asthma     Past Surgical History:  Procedure Laterality Date  . BREAST BIOPSY  11/14/2011   Procedure: BREAST BIOPSY WITH NEEDLE LOCALIZATION;  Surgeon: Haywood Lasso, MD;  Location: [facility];  Service: General;  Laterality: Right;  needle localization right breast calcifications  . BREAST LUMPECTOMY  11/14/11   RIGHT  BREAST=DUCTAL CA IN SITU,W/CALCIFICATIONS,HIGH GRADE,2.4CM,SURGICAL RESECTION MARGINS NEG,  DR. C. STRECK  . CERVIX POLYP  10/17/11   NO MALIGNANCY  . ENDOMETRIAL BIOPSY  12/09/11   UTERUS=BENIGN ENDOMETRIAL GLANDS,BENIGN SQUAMOUS EPITHELIAL CELLS,NO DYSPLASIA OR MALIGNANCY  . TONSILLECTOMY    . TOOTH EXTRACTION  2000  . VAGINAL HYSTERECTOMY  01/26/2012   Procedure: HYSTERECTOMY VAGINAL;  Surgeon: Bennetta Laos, MD;  Location: [facility];  Service: Gynecology;  Laterality: N/A;  . VAGINAL HYSTERECTOMY       OB History    Gravida  0   Para      Term      Preterm      AB      Living        SAB      TAB      Ectopic      Multiple      Live  Births           Obstetric Comments  Menarche age 67, Nulliparity, BC x 20 years, Premenopausal with heavy menses - Megace         Home Medications    Prior to Admission medications   Medication Sig Start Date End Date Taking? Authorizing Provider  albuterol (PROVENTIL HFA;VENTOLIN HFA) 108 (90 Base) MCG/ACT inhaler INHALE 1 PUFF INTO LUNGS ONCE DAILY AS NEEDED FOR ASTHMA 04/04/18   Dettinger, Fransisca Kaufmann, MD  EPINEPHrine (EPI-PEN) 0.3 mg/0.3 mL DEVI Inject 0.3 mLs (0.3 mg total) into the muscle once. 09/20/12   Hal[place]urton, Mae E, FNP  EVENING PRIMROSE OIL PO Take by mouth daily as needed.     [provider]  Fluticasone-Salmeterol (ADVAIR) 100-50 MCG/DOSE AEPB INHALE 1 DOSE BY MOUTH TWICE DAILY 04/04/18   Dettinger, Fransisca Kaufmann, MD  hydrochlorothiazide (HYDRODIURIL) 25 MG tablet Take 1 tablet (25 mg total) by mouth daily. 05/22/18   Dettinger, Fransisca Kaufmann, MD  levocetirizine (XYZAL) 5 MG tablet TAKE 1 TABLET BY MOUTH IN THE EVENING 04/04/18   Dettinger, Fransisca Kaufmann, MD  Menthol, Topical Analgesic, 4 % GEL APPLY TO AFFECTED AREA UP TO 4 TIMES DAILY 04/04/18   Dettinger, Fransisca Kaufmann, MD  Multiple Vitamins-Minerals (ONE-A-DAY MENOPAUSE FORMULA PO) Take 1 tablet by mouth daily.     [provider]  naproxen sodium (ANAPROX) 220 MG tablet Take 1 tablet (220 mg total) by mouth 2 (two) times daily as needed. 11/04/16   Dettinger, Fransisca Kaufmann, MD  omeprazole (PRILOSEC) 20 MG capsule Take 1 capsule (20 mg total) by mouth daily. 05/04/18   Dettinger, Fransisca Kaufmann, MD  Spacer/Aero Chamber Mouthpiece MISC 1 each by Does not apply route every 6 (six) hours as needed. 02/08/17   Eustaquio Maize, MD  tamoxifen (NOLVADEX) 20 MG tablet TAKE 1 TABLET BY MOUTH ONCE DAILY 05/14/18   Nicholas Lose, MD  valsartan (DIOVAN) 160 MG tablet TAKE 1 TABLET BY MOUTH ONCE DAILY (NEEDS  TO  BE  SEEN) 04/04/18   Dettinger, Fransisca Kaufmann, MD    Family History Family History  Problem Relation Age of Onset  . Diabetes Mother   . Hypertension Mother   . Heart disease Mother   . Colon cancer Mother 55  . Hypertension Father   . ALS Father 5  . Breast cancer Maternal Aunt        Age 63's  . Ovarian cancer Paternal Aunt 77  . Breast cancer Paternal Aunt        dx in her 55s  . Breast cancer Cousin        3 paternal cousins with breast cancer >50  . Hypertension Brother     Social History Social History   Tobacco Use  . Smoking status: Never Smoker  . Smokeless tobacco: Never Used  Substance Use Topics  . Alcohol use: Yes    Comment: rare 1 or 2 times per month  . Drug use: No     Allergies   Lisinopril; Bee pollen; and Oxycodone   Review of Systems Review of Systems ROS: Statement: All systems negative except as marked or noted in the HPI; Constitutional: Negative for fever and chills. ; ; Eyes: Negative for eye pain, redness and discharge. ; ; ENMT: Negative for  ear pain, hoarseness, nasal congestion, sinus pressure and sore throat. ; ; Cardiovascular: Negative for chest pain, palpitations, diaphoresis, dyspnea and peripheral edema. ; ; Respiratory: Negative for cough, wheezing and stridor. ; ; Gastrointestinal:  Negative for nausea, vomiting, diarrhea, abdominal pain, blood in stool, hematemesis, jaundice and rectal bleeding. . ; ; Genitourinary: Negative for dysuria, flank pain and hematuria. ; ; Musculoskeletal: Negative for back pain and neck pain. Negative for swelling and trauma.; ; Skin: Negative for pruritus, rash, abrasions, blisters, bruising and skin lesion.; ; Neuro: Negative for headache and neck stiffness. Negative for altered mental status, extremity weakness, paresthesias, involuntary movement, seizure and +lightheadedness, +syncope.       Physical Exam Updated Vital Signs BP 127/81 (BP Location: Right Arm)   Pulse 69   Temp 98.4 F (36.9 C) (Oral)   Resp 17   Ht 5\' 9"  (1.753 m)   Wt (!) 146.1 kg   LMP 12/01/2011   SpO2 95%   BMI 47.55 kg/m     09:08 Orthostatic Vital Signs LA  Orthostatic Lying   BP- Lying: 126/67  Pulse- Lying: 63      Orthostatic Sitting  BP- Sitting: 124/83  Pulse- Sitting: 72      Orthostatic Standing at 0 minutes  BP- Standing at 0 minutes: 120/81  Pulse- Standing at 0 minutes: 74     Physical Exam 0845: Physical examination:  Nursing notes reviewed; Vital signs and O2 SAT reviewed;  Constitutional: Well developed, Well nourished, Well hydrated, In no acute distress; Head:  Normocephalic, atraumatic; Eyes: EOMI, PERRL, No scleral icterus; ENMT: TM's clear bilat. +edemetous nasal turbinates bilat with clear rhinorrhea. Mouth and pharynx without lesions. No tonsillar exudates. No intra-oral edema. No submandibular or sublingual edema. No hoarse voice, no drooling, no stridor. No pain with manipulation of larynx. No trismus. Mouth and pharynx normal, Mucous membranes moist; Neck: Supple, Full range of  motion, No lymphadenopathy; Cardiovascular: Regular rate and rhythm, No gallop; Respiratory: Breath sounds clear & equal bilaterally, No wheezes.  Speaking full sentences with ease, Normal respiratory effort/excursion; Chest: Nontender, Movement normal; Abdomen: Soft, Nontender, Nondistended, Normal bowel sounds; Genitourinary: No CVA tenderness; Spine:  No midline CS, TS, LS tenderness.;; Extremities: Peripheral pulses normal, No tenderness, No edema, No calf edema or asymmetry.; Neuro: AA&Ox3, Major CN grossly intact. No facial droop. Speech clear. No gross focal motor or sensory deficits in extremities.; Skin: Color normal, Warm, Dry.   ED Treatments / Results  Labs (all labs ordered are listed, but only abnormal results are displayed)   EKG EKG Interpretation  Date/Time:  Monday May 28 2018 08:36:44 EST Ventricular Rate:  82 PR Interval:    QRS Duration: 100 QT Interval:  377 QTC Calculation: 441 R Axis:   16 Text Interpretation:  Sinus rhythm Borderline T abnormalities, diffuse leads When compared with ECG of 01/23/2012 Diffuse Nonspecific T wave abnormality is now Present Confirmed by Francine Graven [phone]) on 05/28/2018 8:52:31 AM   Radiology   Procedures Procedures (including critical care time)  Medications Ordered in ED Medications - No data to display   Initial Impression / Assessment and Plan / ED Course  I have reviewed the triage vital signs and the nursing notes.  Pertinent labs & imaging results that were available during my care of the patient were reviewed by me and considered in my medical decision making (see chart for details).  MDM Reviewed: previous chart, nursing note and vitals Reviewed previous: labs and ECG Interpretation: labs, ECG, x-ray and CT scan   Results for orders placed or performed during the hospital encounter of 05/28/18  Urinalysis, Routine w reflex microscopic  Result Value Ref Range   Color, Urine YELLOW YELLOW   APPearance  CLEAR CLEAR   Specific Gravity, Urine 1.015 1.005 - 1.030   pH 6.0 5.0 - 8.0   Glucose, UA NEGATIVE NEGATIVE mg/dL   Hgb urine dipstick NEGATIVE NEGATIVE   Bilirubin Urine NEGATIVE NEGATIVE   Ketones, ur NEGATIVE NEGATIVE mg/dL   Protein, ur NEGATIVE NEGATIVE mg/dL   Nitrite NEGATIVE NEGATIVE   Leukocytes, UA NEGATIVE NEGATIVE  Comprehensive metabolic panel  Result Value Ref Range   Sodium 137 135 - 145 mmol/L   Potassium 3.9 3.5 - 5.1 mmol/L   Chloride 100 98 - 111 mmol/L   CO2 28 22 - 32 mmol/L   Glucose, Bld 122 (H) 70 - 99 mg/dL   BUN 17 6 - 20 mg/dL   Creatinine, Ser 0.93 0.44 - 1.00 mg/dL   Calcium 9.1 8.9 - 10.3 mg/dL   Total Protein 8.1 6.5 - 8.1 g/dL   Albumin 4.2 3.5 - 5.0 g/dL   AST 22 15 - 41 U/L   ALT 19 0 - 44 U/L   Alkaline Phosphatase 92 38 - 126 U/L   Total Bilirubin 0.7 0.3 - 1.2 mg/dL   GFR calc non Af Amer >60 >60 mL/min   GFR calc Af Amer >60 >60 mL/min   Anion gap 9 5 - 15  Troponin I - Once  Result Value Ref Range   Troponin I <0.03 <0.03 ng/mL  Lactic acid, plasma  Result Value Ref Range   Lactic Acid, Venous 0.9 0.5 - 1.9 mmol/L  CBC with Differential  Result Value Ref Range   WBC 8.8 4.0 - 10.5 K/uL   RBC 5.27 (H) 3.87 - 5.11 MIL/uL   Hemoglobin 13.5 12.0 - 15.0 g/dL   HCT 45.2 36.0 - 46.0 %   MCV 85.8 80.0 - 100.0 fL   MCH 25.6 (L) 26.0 - 34.0 pg   MCHC 29.9 (L) 30.0 - 36.0 g/dL   RDW 13.8 11.5 - 15.5 %   Platelets 177 150 - 400 K/uL   nRBC 0.0 0.0 - 0.2 %   Neutrophils Relative % 86 %   Neutro Abs 7.6 1.7 - 7.7 K/uL   Lymphocytes Relative 7 %   Lymphs Abs 0.6 (L) 0.7 - 4.0 K/uL   Monocytes Relative 6 %   Monocytes Absolute 0.5 0.1 - 1.0 K/uL   Eosinophils Relative 1 %   Eosinophils Absolute 0.1 0.0 - 0.5 K/uL   Basophils Relative 0 %   Basophils Absolute 0.0 0.0 - 0.1 K/uL   Immature Granulocytes 0 %   Abs Immature Granulocytes 0.03 0.00 - 0.07 K/uL  D-dimer, quantitative  Result Value Ref Range   D-Dimer, Quant 0.31 0.00 - 0.50  ug/mL-FEU   Dg Chest 2 View Result Date: 05/28/2018 CLINICAL DATA:  Near syncope today. EXAM: CHEST - 2 VIEW COMPARISON:  None. FINDINGS: Lungs are adequately inflated without consolidation or effusion. Cardiomediastinal silhouette and remainder of the exam is unremarkable. IMPRESSION: No active cardiopulmonary disease. Electronically Signed   By: Marin Olp M.D.   On: 05/28/2018 09:36   Ct Head Wo Contrast Result Date: 05/28/2018 CLINICAL DATA:  Syncope EXAM: CT HEAD WITHOUT CONTRAST TECHNIQUE: Contiguous axial images were obtained from the base of the skull through the vertex without intravenous contrast. COMPARISON:  None. FINDINGS: Brain: No evidence of acute infarction, hemorrhage, hydrocephalus, extra-axial collection or mass lesion/mass effect. Vascular: No hyperdense vessel or unexpected calcification. Skull: Normal. Negative for fracture or focal lesion. Sinuses/Orbits: No acute finding. Other: None. IMPRESSION: No acute intracranial pathology. Electronically Signed  By: Eddie Candle M.D.   On: 05/28/2018 09:30    1030:  Pt not low risk syncope score; will observation admit. Dx and testing d/w pt and family.  Questions answered.  Verb understanding, agreeable to admit. T/C returned from [facility]. Wynetta Emery, case discussed, including:  HPI, pertinent PM/SHx, VS/PE, dx testing, ED course and treatment:  Agreeable to admit.       Final Clinical Impressions(s) / ED Diagnoses   Final diagnoses:  None    ED Discharge Orders    None       Francine Graven, DO 05/30/18 [id number]

## 2018-05-28 NOTE — ED Triage Notes (Signed)
EMS reports pt c/o generalized weakness since 0630 this morning.  Reports cold symptoms for few days.  Pt denies pain.

## 2018-05-28 NOTE — Progress Notes (Signed)
EEG completed, results pending. 

## 2018-05-28 NOTE — ED Notes (Addendum)
Pt reports when she woke up she felt fine and went to the bathroom to wash her face, after returning to her room pt reports she started feeling "hot and cold and like the room was spinning. I sat down on the side of the bed and went out. When I woke up I was lying on the floor. I guess I was out for a few minutes."   Pt reports hx of anemia and feeling weak like this in the past when she had problems with anemia.

## 2018-05-28 NOTE — Progress Notes (Signed)
*  PRELIMINARY RESULTS* Echocardiogram 2D Echocardiogram has been performed.  Leavy Cella 05/28/2018, 3:48 PM

## 2018-05-28 NOTE — ED Notes (Signed)
Pt moved to hospital bed, update given,

## 2018-05-28 NOTE — Procedures (Signed)
History: 58 year old female being evaluated for syncope  Sedation: None  Technique: This is a 21 channel routine scalp EEG performed at the bedside with bipolar and monopolar montages arranged in accordance to the international 10/20 system of electrode placement. One channel was dedicated to EKG recording.    Background: The background consists of intermixed alpha and beta activities. There is a well defined posterior dominant rhythm of 10 hz that attenuates with eye opening. Sleep is recorded with normal appearing structures.   Photic stimulation: Physiologic driving is present  EEG Abnormalities: None  Clinical Interpretation: This normal EEG is recorded in the waking and sleep state. There was no seizure or seizure predisposition recorded on this study. Please note that lack of epileptiform activity on EEG does not preclude the possibility of epilepsy.   Roland Rack, MD [facility] [phone]  If 7pm- 7am, please page neurology on call as listed in [place].

## 2018-05-29 ENCOUNTER — Other Ambulatory Visit: Payer: Self-pay

## 2018-05-29 DIAGNOSIS — I10 Essential (primary) hypertension: Secondary | ICD-10-CM | POA: Diagnosis not present

## 2018-05-29 DIAGNOSIS — K210 Gastro-esophageal reflux disease with esophagitis: Secondary | ICD-10-CM | POA: Diagnosis not present

## 2018-05-29 LAB — LIPID PANEL
CHOLESTEROL: 243 mg/dL — AB (ref 0–200)
HDL: 56 mg/dL (ref >40)
LDL Cholesterol: 167 mg/dL — ABNORMAL HIGH (ref 0–99)
Total CHOL/HDL Ratio: 4.3 RATIO
Triglycerides: 101 mg/dL (ref <150)
VLDL: 20 mg/dL (ref 0–40)

## 2018-05-29 LAB — URINE CULTURE

## 2018-05-29 LAB — CBG MONITORING, ED: Glucose-Capillary: 120 mg/dL — ABNORMAL HIGH (ref 70–99)

## 2018-05-29 LAB — HIV ANTIBODY (ROUTINE TESTING W REFLEX): HIV Screen 4th Generation wRfx: NONREACTIVE

## 2018-05-29 LAB — MAGNESIUM: Magnesium: 2.2 mg/dL (ref 1.7–2.4)

## 2018-05-29 LAB — TROPONIN I: Troponin I: <0.03 ng/mL (ref <0.03)

## 2018-05-29 MED ORDER — HYDROCHLOROTHIAZIDE 25 MG PO TABS: 12.5000 mg | ORAL_TABLET | Freq: Every day | ORAL | 3 refills | Status: DC

## 2018-05-29 MED ORDER — ATORVASTATIN CALCIUM 20 MG PO TABS: 20.0000 mg | ORAL_TABLET | Freq: Every day | ORAL | 0 refills | Status: DC

## 2018-05-29 MED ORDER — ATORVASTATIN CALCIUM 10 MG PO TABS: 20.0000 mg | ORAL_TABLET | Freq: Every day | ORAL | Status: DC

## 2018-05-29 NOTE — Evaluation (Signed)
Physical Therapy Evaluation Patient Details Name: NATALIYAH PACKHAM MRN: [id number] DOB: 1960-11-03 Today's Date: 05/29/2018   History of Present Illness  Jayah Balthazar. [place] is a 58 y/o female with c/o syncopal episode, PMHx HTN, anemia, right breast cancer  Clinical Impression  Patient functioning at baseline for functional mobility and gait.  Plan:  Patient discharged from physical therapy to care of nursing for ambulation daily as tolerated for length of stay.     Follow Up Recommendations No PT follow up    Equipment Recommendations  None recommended by PT    Recommendations for Other Services       Precautions / Restrictions Precautions Precautions: None Restrictions Weight Bearing Restrictions: No      Mobility  Bed Mobility Overal bed mobility: Independent                Transfers Overall transfer level: Independent Equipment used: None                Ambulation/Gait Ambulation/Gait assistance: Modified independent (Device/Increase time) Gait Distance (Feet): 120 Feet Assistive device: None Gait Pattern/deviations: WFL(Within Functional Limits) Gait velocity: slightly decreased   General Gait Details: no loss of balance or c/o dizziness  Stairs            Wheelchair Mobility    Modified Rankin (Stroke Patients Only)       Balance Overall balance assessment: No apparent balance deficits (not formally assessed)                                           Pertinent Vitals/Pain Pain Assessment: No/denies pain    Home Living Family/patient expects to be discharged to:: Private residence Living Arrangements: Spouse/significant other Available Help at Discharge: Family Type of Home: House Home Access: Level entry     Home Layout: One level Home Equipment: None      Prior Function Level of Independence: Independent         Comments: Hydrographic surveyor, drives, works     Journalist, newspaper         Extremity/Trunk Assessment   Upper Extremity Assessment Upper Extremity Assessment: Overall WFL for tasks assessed    Lower Extremity Assessment Lower Extremity Assessment: Overall WFL for tasks assessed    Cervical / Trunk Assessment Cervical / Trunk Assessment: Normal  Communication   Communication: No difficulties  Cognition Arousal/Alertness: Awake/alert Behavior During Therapy: WFL for tasks assessed/performed Overall Cognitive Status: Within Functional Limits for tasks assessed                                        General Comments      Exercises     Assessment/Plan    PT Assessment Patent does not need any further PT services  PT Problem List         PT Treatment Interventions      PT Goals (Current goals can be found in the Care Plan section)  Acute Rehab PT Goals Patient Stated Goal: return home PT Goal Formulation: With patient/family Time For Goal Achievement: 05/29/18 Potential to Achieve Goals: Good    Frequency     Barriers to discharge        Co-evaluation               AM-PAC PT "6  Clicks" Mobility  Outcome Measure Help needed turning from your back to your side while in a flat bed without using bedrails?: None Help needed moving from lying on your back to sitting on the side of a flat bed without using bedrails?: None Help needed moving to and from a bed to a chair (including a wheelchair)?: None Help needed standing up from a chair using your arms (e.g., wheelchair or bedside chair)?: None Help needed to walk in hospital room?: None Help needed climbing 3-5 steps with a railing? : None 6 Click Score: 24    End of Session   Activity Tolerance: Patient tolerated treatment well Patient left: in chair;with family/visitor present Nurse Communication: Mobility status PT Visit Diagnosis: Unsteadiness on feet (R26.81);Other abnormalities of gait and mobility (R26.89);Muscle weakness (generalized) (M62.81)    Time:  5436-0677 PT Time Calculation (min) (ACUTE ONLY): 18 min   Charges:   PT Evaluation $PT Eval Low Complexity: 1 Low PT Treatments $Gait Training: 8-22 mins        11:30 AM, 05/29/18 Lonell Grandchild, MPT Physical Therapist with [facility] [phone] office [phone] mobile phone

## 2018-05-29 NOTE — Discharge Summary (Signed)
Physician Discharge Summary  Kayla Neal DSK:[id number] DOB: November 30, 1960 DOA: 05/28/2018  PCP: Dettinger, Fransisca Kaufmann, MD  Admit date: 05/28/2018 Discharge date: 05/29/2018  Admitted From: Home  Disposition: Home   Recommendations for Outpatient Follow-up:  1. Follow up with PCP in 1 weeks 2. Please consider 30 day cardiac monitor or cardiology referral if symptoms persist  Discharge Condition: STABLE   CODE STATUS: FULL    Brief Hospitalization Summary: Please see all hospital notes, images, labs for full details of the hospitalization. HPI: Kayla Neal is a 59 y.o. female with hypertension, history of anemia, history of right breast cancer presented to the emergency department earlier this morning after having an episode where she passed out completely.  She said that she had gotten up early in the morning was getting ready for work washing her face and putting in contact lenses and she began to feel hot and cold spells and lightheaded.  She then went to sit down on the edge of her bed and then woke up on the floor.  She was out for several minutes.  She denies having headache.  She denies injury to the tongue.  She denies chest pain and palpitations.  She says she did have dizziness and lightheaded prior to the event.  The patient says she has passed out several years ago but it was associated with anemia.  She has not had any recent problems with anemia.  She denies any other symptoms.  She denies chest pain or shortness of breath neck pain back pain and sore throat and cough.  ED Course: The patient was evaluated in the emergency department and she was noted to have normal vital signs.  Her EKG was not revealing any acute changes.  Her initial labs did not show any acute findings.  She will be admitted for further observation for syncopal episode.  The patient was admitted for an episode of syncope and collapse at home.  She was monitored under continuous telemetry monitoring which revealed  no significant findings.  She had a 2D echocardiogram with no significant findings to explain syncope.  Her carotid Doppler studies did not show any significant findings to explain syncope.  She had a normal EEG study.  The patient was seen by physical therapy and they had no further follow-up recommendations she did very well.  The patient was given general IV fluids.  Her HCTZ was held in the hospital.  At discharge her HCTZ is being reduced by 50% to 12.5 mg daily.  The patient should follow-up with her primary care physician next week.  She was noted to have an elevated cholesterol level of 243 and LDL of 167.  She was prescribed atorvastatin 20 mg every evening.  Her troponin was less than 0.033.  Her magnesium was within normal limits.  Her hemoglobin A1c was 5.7%.  If she has any recurrent symptoms then would consider cardiology referral and 30-day ambulatory cardiac event monitoring.  Discharge Diagnoses:  Principal Problem:   Syncope and collapse Active Problems:   Essential hypertension   GERD (gastroesophageal reflux disease)   Discharge Instructions: Discharge Instructions    Call MD for:  difficulty breathing, headache or visual disturbances   Complete by:  As directed    Call MD for:  extreme fatigue   Complete by:  As directed    Call MD for:  persistant dizziness or light-headedness   Complete by:  As directed    Diet - low sodium heart healthy  Complete by:  As directed    Increase activity slowly   Complete by:  As directed      Allergies as of 05/29/2018      Reactions   Lisinopril Nausea And Vomiting, Other (See Comments)   Severe stomach cramps   Bee Pollen Hives   Has Hives with Bee Stings   Oxycodone Other (See Comments)   Patient prefers not to be on this med.  It makes her very loopy and unable to function.      Medication List    TAKE these medications   albuterol 108 (90 Base) MCG/ACT inhaler Commonly known as:  PROVENTIL HFA;VENTOLIN HFA INHALE 1  PUFF INTO LUNGS ONCE DAILY AS NEEDED FOR ASTHMA   atorvastatin 20 MG tablet Commonly known as:  LIPITOR Take 1 tablet (20 mg total) by mouth daily at 6 PM for 30 days.   EPINEPHrine 0.3 mg/0.3 mL Soaj injection Commonly known as:  EPI-PEN Inject 0.3 mLs (0.3 mg total) into the muscle once.   EVENING PRIMROSE OIL PO Take by mouth daily as needed.   Fluticasone-Salmeterol 100-50 MCG/DOSE Aepb Commonly known as:  ADVAIR INHALE 1 DOSE BY MOUTH TWICE DAILY   hydrochlorothiazide 25 MG tablet Commonly known as:  HYDRODIURIL Take 0.5 tablets (12.5 mg total) by mouth daily. What changed:  how much to take   levocetirizine 5 MG tablet Commonly known as:  XYZAL TAKE 1 TABLET BY MOUTH IN THE EVENING   Menthol (Topical Analgesic) 4 % Gel APPLY TO AFFECTED AREA UP TO 4 TIMES DAILY   naproxen sodium 220 MG tablet Commonly known as:  ALEVE Take 1 tablet (220 mg total) by mouth 2 (two) times daily as needed.   omeprazole 20 MG capsule Commonly known as:  PRILOSEC Take 1 capsule (20 mg total) by mouth daily.   Spacer/Aero Chamber Mouthpiece Misc 1 each by Does not apply route every 6 (six) hours as needed.   valsartan 160 MG tablet Commonly known as:  DIOVAN TAKE 1 TABLET BY MOUTH ONCE DAILY (NEEDS  TO  BE  SEEN)      Follow-up Information    Dettinger, Fransisca Kaufmann, MD Follow up on 06/06/2018.   Specialties:  Family Medicine, Cardiology Why:  as scheduled Contact information: [address] [place] [address] [phone]          Allergies  Allergen Reactions  . Lisinopril Nausea And Vomiting and Other (See Comments)    Severe stomach cramps  . Bee Pollen Hives    Has Hives with Bee Stings  . Oxycodone Other (See Comments)    Patient prefers not to be on this med.  It makes her very loopy and unable to function.   Allergies as of 05/29/2018      Reactions   Lisinopril Nausea And Vomiting, Other (See Comments)   Severe stomach cramps   Bee Pollen Hives   Has Hives  with Bee Stings   Oxycodone Other (See Comments)   Patient prefers not to be on this med.  It makes her very loopy and unable to function.      Medication List    TAKE these medications   albuterol 108 (90 Base) MCG/ACT inhaler Commonly known as:  PROVENTIL HFA;VENTOLIN HFA INHALE 1 PUFF INTO LUNGS ONCE DAILY AS NEEDED FOR ASTHMA   atorvastatin 20 MG tablet Commonly known as:  LIPITOR Take 1 tablet (20 mg total) by mouth daily at 6 PM for 30 days.   EPINEPHrine 0.3 mg/0.3  mL Soaj injection Commonly known as:  EPI-PEN Inject 0.3 mLs (0.3 mg total) into the muscle once.   EVENING PRIMROSE OIL PO Take by mouth daily as needed.   Fluticasone-Salmeterol 100-50 MCG/DOSE Aepb Commonly known as:  ADVAIR INHALE 1 DOSE BY MOUTH TWICE DAILY   hydrochlorothiazide 25 MG tablet Commonly known as:  HYDRODIURIL Take 0.5 tablets (12.5 mg total) by mouth daily. What changed:  how much to take   levocetirizine 5 MG tablet Commonly known as:  XYZAL TAKE 1 TABLET BY MOUTH IN THE EVENING   Menthol (Topical Analgesic) 4 % Gel APPLY TO AFFECTED AREA UP TO 4 TIMES DAILY   naproxen sodium 220 MG tablet Commonly known as:  ALEVE Take 1 tablet (220 mg total) by mouth 2 (two) times daily as needed.   omeprazole 20 MG capsule Commonly known as:  PRILOSEC Take 1 capsule (20 mg total) by mouth daily.   Spacer/Aero Chamber Mouthpiece Misc 1 each by Does not apply route every 6 (six) hours as needed.   valsartan 160 MG tablet Commonly known as:  DIOVAN TAKE 1 TABLET BY MOUTH ONCE DAILY (NEEDS  TO  BE  SEEN)       Procedures/Studies: Echocardiogram 05/28/18 IMPRESSIONS  1. The left ventricle has normal systolic function of 94-49%. The cavity size was normal. There is moderately increased left ventricular wall thickness. Echo evidence of impaired diastolic relaxation.  2. The right ventricle has normal systolic function. The cavity was normal. There is no increase in right ventricular wall  thickness.  3. The mitral valve is normal in structure. No evidence of mitral valve stenosis.  4. The tricuspid valve is normal in structure.  5. The aortic valve is tricuspid.  6. The aortic root is normal in size and structure.  7. Pulmonary hypertension is Inadequate TR jet, cannot estimate PASP.  Dg Chest 2 View  Result Date: 05/28/2018 CLINICAL DATA:  Near syncope today. EXAM: CHEST - 2 VIEW COMPARISON:  None. FINDINGS: Lungs are adequately inflated without consolidation or effusion. Cardiomediastinal silhouette and remainder of the exam is unremarkable. IMPRESSION: No active cardiopulmonary disease. Electronically Signed   By: Marin Olp M.D.   On: 05/28/2018 09:36   Ct Head Wo Contrast  Result Date: 05/28/2018 CLINICAL DATA:  Syncope EXAM: CT HEAD WITHOUT CONTRAST TECHNIQUE: Contiguous axial images were obtained from the base of the skull through the vertex without intravenous contrast. COMPARISON:  None. FINDINGS: Brain: No evidence of acute infarction, hemorrhage, hydrocephalus, extra-axial collection or mass lesion/mass effect. Vascular: No hyperdense vessel or unexpected calcification. Skull: Normal. Negative for fracture or focal lesion. Sinuses/Orbits: No acute finding. Other: None. IMPRESSION: No acute intracranial pathology. Electronically Signed   By: Eddie Candle M.D.   On: 05/28/2018 09:30   US Carotid Bilateral  Result Date: 05/28/2018 CLINICAL DATA:  Syncope and collapse. EXAM: BILATERAL CAROTID DUPLEX ULTRASOUND TECHNIQUE: Pearline Cables scale imaging, color Doppler and duplex ultrasound were performed of bilateral carotid and vertebral arteries in the neck. COMPARISON:  None. FINDINGS: Criteria: Quantification of carotid stenosis is based on velocity parameters that correlate the residual internal carotid diameter with NASCET-based stenosis levels, using the diameter of the distal internal carotid lumen as the denominator for stenosis measurement. The following velocity measurements  were obtained: RIGHT ICA: 113 cm/sec CCA: 675 cm/sec SYSTOLIC ICA/CCA RATIO:  0.6 ECA: 111 cm/sec LEFT ICA: 74 cm/sec CCA: 916 cm/sec SYSTOLIC ICA/CCA RATIO:  0.6 ECA: 75 cm/sec RIGHT CAROTID ARTERY: Right carotid arteries are patent without significant stenosis.  Minimal plaque in the internal carotid artery. Normal waveforms and velocities in the internal carotid artery. External carotid artery is patent with normal waveform. RIGHT VERTEBRAL ARTERY: Antegrade flow and normal waveform in the right vertebral artery. LEFT CAROTID ARTERY: Small amount of echogenic plaque at the left carotid bulb. External carotid artery is patent with normal waveform. Small amount of plaque in the proximal internal carotid artery. Normal waveforms and velocities in the internal carotid artery. LEFT VERTEBRAL ARTERY: Antegrade flow and normal waveform in the left vertebral artery. IMPRESSION: Mild atherosclerotic disease in the carotid arteries. Estimated degree of stenosis in the internal carotid arteries is less than 50% bilaterally. Patent vertebral arteries with antegrade flow. Electronically Signed   By: Markus Daft M.D.   On: 05/28/2018 15:19      Subjective: Pt says that she feels much better, she has had no further passing out or weakness.  She has follow up with her PCP next week.   Discharge Exam: Vitals:   05/29/18 0400 05/29/18 0749  BP: 126/83 138/79  Pulse: 62 67  Resp: 14 18  Temp:    SpO2: 94% 98%   Vitals:   05/28/18 2351 05/29/18 0038 05/29/18 0400 05/29/18 0749  BP:  (!) 142/88 126/83 138/79  Pulse: 73 78 62 67  Resp: 18 16 14 18   Temp:  98.2 F (36.8 C)    TempSrc:  Oral    SpO2: 95% 96% 94% 98%  Weight:      Height:       General: Pt is alert, awake, not in acute distress Cardiovascular: RRR, S1/S2 +, no rubs, no gallops Respiratory: CTA bilaterally, no wheezing, no rhonchi Abdominal: Soft, NT, ND, bowel sounds + Extremities: no edema, no cyanosis   The results of significant  diagnostics from this hospitalization (including imaging, microbiology, ancillary and laboratory) are listed below for reference.     Microbiology: No results found for this or any previous visit (from the past 240 hour(s)).   Labs: BNP (last 3 results) No results for input(s): BNP in the last 8760 hours. Basic Metabolic Panel: Recent Labs  Lab 05/28/18 0927 05/29/18 0210  NA 137  --   K 3.9  --   CL 100  --   CO2 28  --   GLUCOSE 122*  --   BUN 17  --   CREATININE 0.93  --   CALCIUM 9.1  --   MG  --  2.2   Liver Function Tests: Recent Labs  Lab 05/28/18 0927  AST 22  ALT 19  ALKPHOS 92  BILITOT 0.7  PROT 8.1  ALBUMIN 4.2   No results for input(s): LIPASE, AMYLASE in the last 168 hours. No results for input(s): AMMONIA in the last 168 hours. CBC: Recent Labs  Lab 05/28/18 0927  WBC 8.8  NEUTROABS 7.6  HGB 13.5  HCT 45.2  MCV 85.8  PLT 177   Cardiac Enzymes: Recent Labs  Lab 05/28/18 0927 05/28/18 1604 05/28/18 2039 05/29/18 0210  TROPONINI <0.03 <0.03 <0.03 <0.03   BNP: Invalid input(s): POCBNP CBG: Recent Labs  Lab 05/29/18 0550  GLUCAP 120*   D-Dimer Recent Labs    05/28/18 0927  DDIMER 0.31   Hgb A1c Recent Labs    05/28/18 1431  HGBA1C 5.7*   Lipid Profile Recent Labs    05/29/18 0210  CHOL 243*  HDL 56  LDLCALC 167*  TRIG 101  CHOLHDL 4.3   Thyroid function studies Recent Labs    05/28/18  1604  TSH 0.920   Anemia work up No results for input(s): VITAMINB12, FOLATE, FERRITIN, TIBC, IRON, RETICCTPCT in the last 72 hours. Urinalysis    Component Value Date/Time   COLORURINE YELLOW 05/28/2018 0908   APPEARANCEUR CLEAR 05/28/2018 0908   LABSPEC 1.015 05/28/2018 0908   PHURINE 6.0 05/28/2018 0908   GLUCOSEU NEGATIVE 05/28/2018 0908   HGBUR NEGATIVE 05/28/2018 0908   BILIRUBINUR NEGATIVE 05/28/2018 0908   KETONESUR NEGATIVE 05/28/2018 0908   PROTEINUR NEGATIVE 05/28/2018 0908   UROBILINOGEN 0.2 10/17/2011 1446    NITRITE NEGATIVE 05/28/2018 0908   LEUKOCYTESUR NEGATIVE 05/28/2018 0908   Sepsis Labs Invalid input(s): PROCALCITONIN,  WBC,  LACTICIDVEN Microbiology No results found for this or any previous visit (from the past 240 hour(s)).  Time coordinating discharge:   SIGNED:  Irwin Brakeman, MD  [facility] 05/29/2018, 8:58 AM How to contact the [facility] Attending or Consulting provider [place] or covering provider during after hours [place], for this patient?  1. Check the care team in [facility] and look for a) attending/consulting [facility] provider listed and b) the [facility] team listed 2. Log into [email or web address] and use [place]'s universal password to access. If you do not have the password, please contact the hospital operator. 3. Locate the [facility] provider you are looking for under [facility] and page to a number that you can be directly reached. 4. If you still have difficulty reaching the provider, please page the [facility] (Director on Call) for the Hospitalists listed on amion for assistance.

## 2018-05-29 NOTE — ED Notes (Signed)
Spoke with Jeneen Rinks PT and will see pt first thing this morning so pt can be possibly discharged home from ED without being admitted.

## 2018-05-29 NOTE — ED Notes (Signed)
Patient denies pain and is resting comfortably.  

## 2018-05-29 NOTE — ED Notes (Signed)
Pt resting with eyes closed, resp even and non labored, family remains at bedside,

## 2018-05-29 NOTE — Discharge Instructions (Signed)
Syncope Syncope is when you pass out (faint) for a short time. It is caused by a sudden decrease in blood flow to the brain. Signs that you may be about to pass out include:  Feeling dizzy or light-headed.  Feeling sick to your stomach (nauseous).  Seeing all white or all black.  Having cold, clammy skin. If you pass out, get help right away. Call your local emergency services (911 in the [place] Do not drive yourself to the hospital. Follow these instructions at home: Watch for any changes in your symptoms. Take these actions to stay safe and help with your symptoms: Lifestyle  Do not drive, use machinery, or play sports until your doctor says it is okay.  Do not drink alcohol.  Do not use any products that contain nicotine or tobacco, such as cigarettes and e-cigarettes. If you need help quitting, ask your doctor.  Drink enough fluid to keep your pee (urine) pale yellow. General instructions  Take over-the-counter and prescription medicines only as told by your doctor.  If you are taking blood pressure or heart medicine, sit up and stand up slowly. Spend a few minutes getting ready to sit and then stand. This can help you feel less dizzy.  Have someone stay with you until you feel stable.  If you start to feel like you might pass out, lie down right away and raise (elevate) your feet above the level of your heart. Breathe deeply and steadily. Wait until all of the symptoms are gone.  Keep all follow-up visits as told by your doctor. This is important. Get help right away if:  You have a very bad headache.  You pass out once or more than once.  You have pain in your chest, belly, or back.  You have a very fast or uneven heartbeat (palpitations).  It hurts to breathe.  You are bleeding from your mouth or your bottom (rectum).  You have black or tarry poop (stool).  You have jerky movements that you cannot control (seizure).  You are confused.  You have trouble  walking.  You are very weak.  You have vision problems. These symptoms may be an emergency. Do not wait to see if the symptoms will go away. Get medical help right away. Call your local emergency services (911 in the [place] Do not drive yourself to the hospital. Summary  Syncope is when you pass out (faint) for a short time. It is caused by a sudden decrease in blood flow to the brain.  Signs that you may be about to faint include feeling dizzy, light-headed, or sick to your stomach, seeing all white or all black, or having cold, clammy skin.  If you start to feel like you might pass out, lie down right away and raise (elevate) your feet above the level of your heart. Breathe deeply and steadily. Wait until all of the symptoms are gone. This information is not intended to replace advice given to you by your health care provider. Make sure you discuss any questions you have with your health care provider. Document Released: 09/21/2007 Document Revised: 05/17/2017 Document Reviewed: 05/17/2017 Elsevier Interactive Patient Education  2019 [employer]   IMPORTANT INFORMATION: PAY CLOSE ATTENTION   PHYSICIAN DISCHARGE INSTRUCTIONS  Follow with Primary care provider  Dettinger, Fransisca Kaufmann, MD  and other consultants as instructed your Hospitalist Physician  Wall[place] IF SYMPTOMS COME BACK, WORSEN OR NEW PROBLEM DEVELOPS.   Please note: You were  cared for by a hospitalist during your hospital stay. Every effort will be made to forward records to your primary care provider.  You can request that your primary care provider send for your hospital records if they have not received them.  Once you are discharged, your primary care physician will handle any further medical issues. Please note that NO REFILLS for any discharge medications will be authorized once you are discharged, as it is imperative that you return to your primary care physician (or establish a  relationship with a primary care physician if you do not have one) for your post hospital discharge needs so that they can reassess your need for medications and monitor your lab values.  Please get a complete blood count and chemistry panel checked by your Primary MD at your next visit, and again as instructed by your Primary MD.  Get Medicines reviewed and adjusted: Please take all your medications with you for your next visit with your Primary MD  Laboratory/radiological data: Please request your Primary MD to go over all hospital tests and procedure/radiological results at the follow up, please ask your primary care provider to get all Hospital records sent to his/her office.  In some cases, they will be blood work, cultures and biopsy results pending at the time of your discharge. Please request that your primary care provider follow up on these results.  If you are diabetic, please bring your blood sugar readings with you to your follow up appointment with primary care.    Please call and make your follow up appointments as soon as possible.    Also Note the following: If you experience worsening of your admission symptoms, develop shortness of breath, life threatening emergency, suicidal or homicidal thoughts you must seek medical attention immediately by calling 911 or calling your MD immediately  if symptoms less severe.  You must read complete instructions/literature along with all the possible adverse reactions/side effects for all the Medicines you take and that have been prescribed to you. Take any new Medicines after you have completely understood and accpet all the possible adverse reactions/side effects.   Do not drive when taking Pain medications or sleeping medications (Benzodiazepines)  Do not take more than prescribed Pain, Sleep and Anxiety Medications. It is not advisable to combine anxiety,sleep and pain medications without talking with your primary care  practitioner  Special Instructions: If you have smoked or chewed Tobacco  in the last 2 yrs please stop smoking, stop any regular Alcohol  and or any Recreational drug use.  Wear Seat belts while driving.

## 2018-06-06 ENCOUNTER — Ambulatory Visit: Payer: BC Managed Care – PPO | Admitting: Family Medicine

## 2018-06-06 ENCOUNTER — Encounter: Payer: Self-pay | Admitting: Family Medicine

## 2018-06-06 VITALS — BP 157/77 | HR 65 | Temp 96.7°F | Ht 69.0 in | Wt 326.6 lb

## 2018-06-06 DIAGNOSIS — I10 Essential (primary) hypertension: Secondary | ICD-10-CM | POA: Diagnosis not present

## 2018-06-06 DIAGNOSIS — R55 Syncope and collapse: Secondary | ICD-10-CM

## 2018-06-06 NOTE — Progress Notes (Signed)
BP (!) 157/77   Pulse 65   Temp (!) 96.7 F (35.9 C) (Oral)   Ht 5\' 9"  (1.753 m)   Wt (!) 326 lb 9.6 oz (148.1 kg)   LMP 12/01/2011   BMI 48.23 kg/m    Subjective:    Patient ID: Kayla Neal, female    DOB: [birth year], 58 y.o.   MRN: [id number]  HPI: Kayla Neal is a 58 y.o. female presenting on 06/06/2018 for Hypertension (2 week BP check )   HPI Hypertension Patient is currently on valsartan and hydrochlorothiazide, and their blood pressure today is 157/77, patient's blood pressure at home was 134/76 today right before she came. Patient denies any lightheadedness or dizziness. Patient denies headaches, blurred vision, chest pains, shortness of breath, or weakness. Denies any side effects from medication and is content with current medication.  Patient did have one episode of syncopal episode but she thinks it was related to some sinus cold and congestion that she went to the emergency department for but since leaving the emergency department they cut her hydrochlorothiazide down to a half a tablet daily and she has not had any of that since  Relevant past medical, surgical, family and social history reviewed and updated as indicated. Interim medical history since our last visit reviewed. Allergies and medications reviewed and updated.  Review of Systems  Constitutional: Negative for chills and fever.  Eyes: Negative for visual disturbance.  Respiratory: Negative for chest tightness and shortness of breath.   Cardiovascular: Negative for chest pain and leg swelling.  Musculoskeletal: Negative for back pain and gait problem.  Skin: Negative for rash.  Neurological: Positive for light-headedness. Negative for dizziness and headaches.  Psychiatric/Behavioral: Negative for agitation and behavioral problems.  All other systems reviewed and are negative.   Per HPI unless specifically indicated above        Objective:    BP (!) 157/77   Pulse 65   Temp (!) 96.7 F (35.9  C) (Oral)   Ht 5\' 9"  (1.753 m)   Wt (!) 326 lb 9.6 oz (148.1 kg)   LMP 12/01/2011   BMI 48.23 kg/m   Wt Readings from Last 3 Encounters:  06/06/18 (!) 326 lb 9.6 oz (148.1 kg)  05/28/18 (!) 322 lb (146.1 kg)  05/04/18 (!) 320 lb (145.2 kg)    Physical Exam Vitals signs and nursing note reviewed.  Constitutional:      General: She is not in acute distress.    Appearance: She is well-developed. She is not diaphoretic.  Eyes:     Conjunctiva/sclera: Conjunctivae normal.  Cardiovascular:     Rate and Rhythm: Normal rate and regular rhythm.     Heart sounds: Normal heart sounds. No murmur.  Pulmonary:     Effort: Pulmonary effort is normal. No respiratory distress.     Breath sounds: Normal breath sounds. No wheezing.  Skin:    General: Skin is warm and dry.     Findings: No rash.  Neurological:     Mental Status: She is alert and oriented to person, place, and time.     Coordination: Coordination normal.  Psychiatric:        Behavior: Behavior normal.       Assessment & Plan:   Problem List Items Addressed This Visit      Cardiovascular and Mediastinum   Essential hypertension - Primary   Syncope and collapse      Continue hydrochlorothiazide and valsartan, continue the hydrochlorothiazide at a  half dose that they recommended from the ER.  Discussed a statin with the patient which they recommended from the ER, and discussed the cardiac benefit and patient understands and will think about it  The 10-year ASCVD risk score Mikey Bussing DC Brooke Bonito., et al., 2013) is: 12%   Values used to calculate the score:     Age: 60 years     Sex: Female     Is Non-Hispanic African American: Yes     Diabetic: No     Tobacco smoker: No     Systolic Blood Pressure: 500 mmHg     Is BP treated: Yes     HDL Cholesterol: 56 mg/dL     Total Cholesterol: 243 mg/dL  Follow up plan: Return in about 3 months (around 09/04/2018), or if symptoms worsen or fail to improve, for Hypertension  recheck.  Counseling provided for all of the vaccine components No orders of the defined types were placed in this encounter.   Caryl Pina, MD [facility] 06/06/2018, 5:02 PM

## 2018-07-13 ENCOUNTER — Other Ambulatory Visit: Payer: Self-pay | Admitting: Pediatrics

## 2018-07-13 ENCOUNTER — Other Ambulatory Visit: Payer: Self-pay | Admitting: Hematology and Oncology

## 2018-07-13 DIAGNOSIS — J4530 Mild persistent asthma, uncomplicated: Secondary | ICD-10-CM

## 2018-08-19 ENCOUNTER — Other Ambulatory Visit: Payer: Self-pay | Admitting: Family Medicine

## 2018-08-19 DIAGNOSIS — I10 Essential (primary) hypertension: Secondary | ICD-10-CM

## 2018-09-06 ENCOUNTER — Ambulatory Visit: Payer: BC Managed Care – PPO | Admitting: Family Medicine

## 2018-10-10 ENCOUNTER — Ambulatory Visit: Payer: BC Managed Care – PPO | Admitting: Family Medicine

## 2018-11-14 ENCOUNTER — Other Ambulatory Visit: Payer: Self-pay | Admitting: Family Medicine

## 2018-11-14 DIAGNOSIS — I10 Essential (primary) hypertension: Secondary | ICD-10-CM

## 2018-11-16 ENCOUNTER — Ambulatory Visit (INDEPENDENT_AMBULATORY_CARE_PROVIDER_SITE_OTHER): Payer: BC Managed Care – PPO | Admitting: Family Medicine

## 2018-11-16 ENCOUNTER — Encounter: Payer: Self-pay | Admitting: Family Medicine

## 2018-11-16 VITALS — BP 127/72 | Temp 98.4°F

## 2018-11-16 DIAGNOSIS — I10 Essential (primary) hypertension: Secondary | ICD-10-CM | POA: Diagnosis not present

## 2018-11-16 DIAGNOSIS — T782XXS Anaphylactic shock, unspecified, sequela: Secondary | ICD-10-CM | POA: Diagnosis not present

## 2018-11-16 DIAGNOSIS — K2100 Gastro-esophageal reflux disease with esophagitis, without bleeding: Secondary | ICD-10-CM

## 2018-11-16 DIAGNOSIS — D509 Iron deficiency anemia, unspecified: Secondary | ICD-10-CM

## 2018-11-16 DIAGNOSIS — J302 Other seasonal allergic rhinitis: Secondary | ICD-10-CM | POA: Diagnosis not present

## 2018-11-16 DIAGNOSIS — J4530 Mild persistent asthma, uncomplicated: Secondary | ICD-10-CM

## 2018-11-16 MED ORDER — LEVOCETIRIZINE DIHYDROCHLORIDE 5 MG PO TABS: 5.0000 mg | ORAL_TABLET | Freq: Every evening | ORAL | 3 refills | Status: DC

## 2018-11-16 MED ORDER — EPINEPHRINE 0.3 MG/0.3ML IJ SOAJ: 0.3000 mg | Freq: Once | INTRAMUSCULAR | 0 refills | Status: AC

## 2018-11-16 MED ORDER — OMEPRAZOLE 20 MG PO CPDR: 20.0000 mg | DELAYED_RELEASE_CAPSULE | Freq: Every day | ORAL | 3 refills | Status: DC

## 2018-11-16 MED ORDER — VALSARTAN 160 MG PO TABS: 160.0000 mg | ORAL_TABLET | Freq: Every day | ORAL | 3 refills | Status: DC

## 2018-11-16 NOTE — Progress Notes (Signed)
Virtual Visit via telephone Note  I connected with Kayla Neal on 11/16/18 at 1413 by telephone and verified that I am speaking with the correct person using two identifiers. Kayla Neal is currently located at home and no other people are currently with her during visit. The provider, Fransisca Kaufmann Shaquilla Kehres, MD is located in their office at time of visit.  Call ended at 1424  I discussed the limitations, risks, security and privacy concerns of performing an evaluation and management service by telephone and the availability of in person appointments. I also discussed with the patient that there may be a patient responsible charge related to this service. The patient expressed understanding and agreed to proceed.  127/62, 98.4, History and Present Illness: Hypertension Patient is currently on valsartan, and their blood pressure today is 127/72. Patient denies any lightheadedness or dizziness. Patient denies headaches, blurred vision, chest pains, shortness of breath, or weakness. Denies any side effects from medication and is content with current medication.   GERD Patient is currently on omeprazole.  She denies any major symptoms or abdominal pain or belching or burping. She denies any blood in her stool or lightheadedness or dizziness.   Asthma Patient is starting with some congestion and seasonal allergies.  She has postnasal drainage.  She denies fevers or chills or shortness or wheezing  No diagnosis found.  Outpatient Encounter Medications as of 11/16/2018  Medication Sig  . albuterol (PROVENTIL HFA;VENTOLIN HFA) 108 (90 Base) MCG/ACT inhaler INHALE 1 PUFF INTO LUNGS ONCE DAILY AS NEEDED FOR ASTHMA  . atorvastatin (LIPITOR) 20 MG tablet Take 1 tablet (20 mg total) by mouth daily at 6 PM for 30 days. (Patient not taking: Reported on 06/06/2018)  . EPINEPHrine (EPI-PEN) 0.3 mg/0.3 mL DEVI Inject 0.3 mLs (0.3 mg total) into the muscle once.  Marland Kitchen EVENING PRIMROSE OIL PO Take by mouth  daily as needed.   . Fluticasone-Salmeterol (ADVAIR) 100-50 MCG/DOSE AEPB INHALE 1 DOSE BY MOUTH TWICE DAILY  . hydrochlorothiazide (HYDRODIURIL) 25 MG tablet Take 0.5 tablets (12.5 mg total) by mouth daily.  Marland Kitchen levocetirizine (XYZAL) 5 MG tablet TAKE 1 TABLET BY MOUTH IN THE EVENING  . Menthol, Topical Analgesic, 4 % GEL APPLY TO AFFECTED AREA UP TO 4 TIMES DAILY  . naproxen sodium (ANAPROX) 220 MG tablet Take 1 tablet (220 mg total) by mouth 2 (two) times daily as needed.  Marland Kitchen omeprazole (PRILOSEC) 20 MG capsule Take 1 capsule (20 mg total) by mouth daily.  Marland Kitchen Spacer/Aero Chamber Mouthpiece MISC 1 each by Does not apply route every 6 (six) hours as needed.  . valsartan (DIOVAN) 160 MG tablet Take 1 tablet by mouth once daily   No facility-administered encounter medications on file as of 11/16/2018.     Review of Systems  Constitutional: Negative for chills and fever.  Eyes: Negative for visual disturbance.  Respiratory: Negative for chest tightness and shortness of breath.   Cardiovascular: Negative for chest pain and leg swelling.  Musculoskeletal: Negative for back pain and gait problem.  Skin: Negative for rash.  Neurological: Negative for dizziness, light-headedness and headaches.  Psychiatric/Behavioral: Negative for agitation and behavioral problems.  All other systems reviewed and are negative.   Observations/Objective: Patient sounds comfortable and in no acute distress  Assessment and Plan: Problem List Items Addressed This Visit      Cardiovascular and Mediastinum   Essential hypertension - Primary   Relevant Medications   EPINEPHrine 0.3 mg/0.3 mL IJ SOAJ injection   valsartan (  DIOVAN) 160 MG tablet   Other Relevant Orders   CMP14+EGFR   Lipid panel     Respiratory   Asthma     Digestive   GERD (gastroesophageal reflux disease)   Relevant Medications   omeprazole (PRILOSEC) 20 MG capsule   Other Relevant Orders   CBC with Differential/Platelet     Other    Iron deficiency anemia    Other Visit Diagnoses    Anaphylactic reaction, sequela       Relevant Medications   EPINEPHrine 0.3 mg/0.3 mL IJ SOAJ injection   Seasonal allergies       Relevant Medications   levocetirizine (XYZAL) 5 MG tablet       Follow Up Instructions: Follow up in 6 months    I discussed the assessment and treatment plan with the patient. The patient was provided an opportunity to ask questions and all were answered. The patient agreed with the plan and demonstrated an understanding of the instructions.   The patient was advised to call back or seek an in-person evaluation if the symptoms worsen or if the condition fails to improve as anticipated.  The above assessment and management plan was discussed with the patient. The patient verbalized understanding of and has agreed to the management plan. Patient is aware to call the clinic if symptoms persist or worsen. Patient is aware when to return to the clinic for a follow-up visit. Patient educated on when it is appropriate to go to the emergency department.    I provided 11 minutes of non-face-to-face time during this encounter.    Worthy Rancher, MD

## 2019-02-01 ENCOUNTER — Ambulatory Visit: Payer: BC Managed Care – PPO

## 2019-02-05 ENCOUNTER — Ambulatory Visit: Payer: BC Managed Care – PPO

## 2019-03-13 ENCOUNTER — Other Ambulatory Visit: Payer: Self-pay | Admitting: Hematology and Oncology

## 2019-03-13 DIAGNOSIS — C50211 Malignant neoplasm of upper-inner quadrant of right female breast: Secondary | ICD-10-CM

## 2019-03-20 ENCOUNTER — Telehealth: Payer: Self-pay | Admitting: Hematology and Oncology

## 2019-03-20 ENCOUNTER — Inpatient Hospital Stay: Payer: BC Managed Care – PPO | Admitting: Hematology and Oncology

## 2019-03-20 NOTE — Telephone Encounter (Signed)
Returned patient's phone call regarding cancelling 12/02 appointment, per patient's request appointment has been cancelled. Patient will call when ready to reschedule.

## 2019-03-20 NOTE — Assessment & Plan Note (Deleted)
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 Denies any hot flashes or myalgias Patient has not taken tamoxifen for 2 years in the middle. It appears that she is tolerating the tamoxifen fairly well since she started exercising and eating better. Because of this we will continue tamoxifen for 1 more years.  Breast Cancer Surveillance: 1. Breast exam 03/20/2019: Benign 2. Mammogram 11/14/17 No abnormalities. Postsurgical changes. Breast Density Category A.  Acid reflux: On omeprazole. RTC in 1 year

## 2019-05-23 ENCOUNTER — Other Ambulatory Visit: Payer: Self-pay | Admitting: *Deleted

## 2019-05-23 DIAGNOSIS — C50211 Malignant neoplasm of upper-inner quadrant of right female breast: Secondary | ICD-10-CM

## 2019-05-23 MED ORDER — TAMOXIFEN CITRATE 20 MG PO TABS: 20.0000 mg | ORAL_TABLET | Freq: Every day | ORAL | 0 refills | Status: DC

## 2019-05-28 ENCOUNTER — Telehealth: Payer: Self-pay | Admitting: Hematology and Oncology

## 2019-05-28 NOTE — Telephone Encounter (Signed)
Scheduled apt per 2/4 sch message - pt aware of appt date and time

## 2019-06-19 NOTE — Progress Notes (Signed)
HEMATOLOGY-ONCOLOGY [facility] VIDEO VISIT PROGRESS NOTE  I connected with Kayla Neal on 06/20/2019 at  3:30 PM EST by MyChart video conference and verified that I am speaking with the correct person using two identifiers.  I discussed the limitations, risks, security and privacy concerns of performing an evaluation and management service by MyChart and the availability of in person appointments.  I also discussed with the patient that there may be a patient responsible charge related to this service. The patient expressed understanding and agreed to proceed.  Patient's Location: Home Physician Location: Clinic  CHIEF COMPLIANT: Follow-up of right breast DCIS on tamoxifen IM good how are you doing  INTERVAL HISTORY: Kayla Neal is a 59 y.o. female with above-mentioned history of right breast DCIS currently on tamoxifen therapy. Mammogram on 11/14/17 showed no evidence of malignancy in either breast. She presents over MyChart today for follow-up.   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 There is no height or weight on file to calculate BMI.  I have reviewed the data as listed CMP Latest Ref Rng & Units 05/28/2018 05/25/2017 06/30/2016  Glucose 70 - 99 mg/dL 122(H) 85 88  BUN 6 - 20 mg/dL 17 10 11   Creatinine 0.44 - 1.00 mg/dL 0.93 0.90 0.85  Sodium 135 - 145 mmol/L 137 141 137  Potassium 3.5 - 5.1 mmol/L 3.9 3.9 4.7  Chloride 98 - 111 mmol/L 100 102 97  CO2 22 - 32 mmol/L 28 26 26   Calcium 8.9 - 10.3 mg/dL 9.1 9.4 9.5  Total Protein 6.5 - 8.1 g/dL 8.1 7.2 7.0  Total Bilirubin 0.3 - 1.2 mg/dL 0.7 0.4 0.3  Alkaline Phos 38 - 126 U/L 92 114 118(H)  AST 15 -  41 U/L 22 15 18   ALT 0 - 44 U/L 19 12 10     Lab Results  Component Value Date   WBC 8.8 05/28/2018   HGB 13.5 05/28/2018   HCT 45.2 05/28/2018   MCV 85.8 05/28/2018   PLT 177 05/28/2018   NEUTROABS 7.6 05/28/2018      Assessment Plan:  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 Denies any hot flashes or myalgias Patient has not taken tamoxifen for 2 years in the middle. It appears that she is tolerating the tamoxifen fairly well since she started exercising and eating better. Because of this we will continue tamoxifen for 2 more years.  Breast Cancer Surveillance: Mammogram 11/14/17 No abnormalities. Postsurgical changes. Breast Density Category A. Patient has not had a mammogram since that time. she's planning to get it 6 weeks after the second Covid vaccine dose.  Feb 2020 emergency room visit for syncope: CT head normal  RTC in 1 year  I discussed the assessment and treatment plan with the patient. The patient was provided an opportunity to ask questions and all were answered. The patient agreed with the plan and demonstrated an understanding of the instructions. The patient was advised to call back or seek an in-person evaluation if the symptoms  worsen or if the condition fails to improve as anticipated.   I provided 20 minutes of face-to-face MyChart video visit time during this encounter.    Rulon Eisenmenger, MD 06/20/2019   I, Molly Dorshimer, am acting as scribe for Nicholas Lose, MD.  I have reviewed the above documentation for accuracy and completeness, and I agree with the above.

## 2019-06-20 ENCOUNTER — Inpatient Hospital Stay: Payer: BC Managed Care – PPO | Attending: Hematology and Oncology | Admitting: Hematology and Oncology

## 2019-06-20 DIAGNOSIS — C50211 Malignant neoplasm of upper-inner quadrant of right female breast: Secondary | ICD-10-CM | POA: Diagnosis not present

## 2019-06-20 DIAGNOSIS — Z170 Estrogen receptor positive status [ER+]: Secondary | ICD-10-CM

## 2019-06-20 MED ORDER — TAMOXIFEN CITRATE 20 MG PO TABS: 20.0000 mg | ORAL_TABLET | Freq: Every day | ORAL | 3 refills | Status: DC

## 2019-06-20 NOTE — Assessment & Plan Note (Signed)
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 Denies any hot flashes or myalgias Patient has not taken tamoxifen for 2 years in the middle. It appears that Kayla Neal is tolerating the tamoxifen fairly well since Kayla Neal started exercising and eating better. Because of this we will continue tamoxifen for 2 more years.  Breast Cancer Surveillance: 1. Breast exam 06/20/2019: Normal 2. Mammogram 11/14/17 No abnormalities. Postsurgical changes. Breast Density Category A. Patient has not had a mammogram since that time.  Recent emergency room visit for syncope: CT head normal  RTC in 1 year

## 2019-06-21 ENCOUNTER — Telehealth: Payer: Self-pay | Admitting: Hematology and Oncology

## 2019-06-21 NOTE — Telephone Encounter (Signed)
I could not reach patient regarding schedule  °

## 2019-10-30 ENCOUNTER — Ambulatory Visit (INDEPENDENT_AMBULATORY_CARE_PROVIDER_SITE_OTHER): Payer: BC Managed Care – PPO | Admitting: Family Medicine

## 2019-10-30 ENCOUNTER — Other Ambulatory Visit: Payer: Self-pay

## 2019-10-30 ENCOUNTER — Encounter: Payer: Self-pay | Admitting: Family Medicine

## 2019-10-30 DIAGNOSIS — J019 Acute sinusitis, unspecified: Secondary | ICD-10-CM | POA: Diagnosis not present

## 2019-10-30 DIAGNOSIS — J4530 Mild persistent asthma, uncomplicated: Secondary | ICD-10-CM | POA: Diagnosis not present

## 2019-10-30 MED ORDER — MONTELUKAST SODIUM 10 MG PO TABS: 10.0000 mg | ORAL_TABLET | Freq: Every day | ORAL | 3 refills | Status: DC

## 2019-10-30 MED ORDER — AMOXICILLIN-POT CLAVULANATE 875-125 MG PO TABS: 1.0000 | ORAL_TABLET | Freq: Two times a day (BID) | ORAL | 0 refills | Status: AC

## 2019-10-30 NOTE — Progress Notes (Signed)
Virtual Visit via Telephone Note  I connected with Kayla Neal on 10/30/19 at 2:02 PM by telephone and verified that I am speaking with the correct person using two identifiers. Kayla Neal is currently located at work and nobody is currently with her during this visit. The provider, Loman Brooklyn, FNP is located in their office at time of visit.  I discussed the limitations, risks, security and privacy concerns of performing an evaluation and management service by telephone and the availability of in person appointments. I also discussed with the patient that there may be a patient responsible charge related to this service. The patient expressed understanding and agreed to proceed.  Subjective: PCP: Dettinger, Fransisca Kaufmann, MD  Chief Complaint  Patient presents with  . Sinusitis   Patient complains of head congestion, facial pain/pressure, postnasal drainage and watery eyes. Onset of symptoms was 4 months ago, gradually worsening since that time. She is drinking plenty of fluids. Evaluation to date: none. Treatment to date: antihistamines. She has a history of asthma. She does not smoke.    ROS: Per HPI  Current Outpatient Medications:  .  albuterol (PROVENTIL HFA;VENTOLIN HFA) 108 (90 Base) MCG/ACT inhaler, INHALE 1 PUFF INTO LUNGS ONCE DAILY AS NEEDED FOR ASTHMA, Disp: 9 g, Rfl: 0 .  EVENING PRIMROSE OIL PO, Take by mouth daily as needed. , Disp: , Rfl:  .  Fluticasone-Salmeterol (ADVAIR) 100-50 MCG/DOSE AEPB, INHALE 1 DOSE BY MOUTH TWICE DAILY, Disp: 60 each, Rfl: 5 .  levocetirizine (XYZAL) 5 MG tablet, Take 1 tablet (5 mg total) by mouth every evening., Disp: 90 tablet, Rfl: 3 .  Menthol, Topical Analgesic, 4 % GEL, APPLY TO AFFECTED AREA UP TO 4 TIMES DAILY, Disp: 74 mL, Rfl: 2 .  naproxen sodium (ANAPROX) 220 MG tablet, Take 1 tablet (220 mg total) by mouth 2 (two) times daily as needed., Disp: 60 tablet, Rfl: 11 .  omeprazole (PRILOSEC) 20 MG capsule, Take 1 capsule (20 mg  total) by mouth daily., Disp: 90 capsule, Rfl: 3 .  Spacer/Aero Chamber Mouthpiece MISC, 1 each by Does not apply route every 6 (six) hours as needed., Disp: 1 each, Rfl: 0 .  tamoxifen (NOLVADEX) 20 MG tablet, Take 1 tablet (20 mg total) by mouth daily., Disp: 90 tablet, Rfl: 3 .  valsartan (DIOVAN) 160 MG tablet, Take 1 tablet (160 mg total) by mouth daily., Disp: 90 tablet, Rfl: 3  Allergies  Allergen Reactions  . Lisinopril Nausea And Vomiting and Other (See Comments)    Severe stomach cramps  . Bee Pollen Hives    Has Hives with Bee Stings  . Oxycodone Other (See Comments)    Patient prefers not to be on this med.  It makes her very loopy and unable to function.   Past Medical History:  Diagnosis Date  . Allergy    LISINOPRIL=N,V  . Anemia 10/18/2011   placed on iron tid for Hgb of 8  . Arthritis    knee pain-uses acetaminophen for pain control prn  . Asthma    seasonal allergies related-prn albuterol  . 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    . Cervical polyp   . Fibroids    MULTIPLE IN ENDOMETRIAL CAVITY  . GERD (gastroesophageal reflux disease)    no meds required in past 3 years  . Hypertension    under control without medication as of 11/11/11-no meds for  at least 1 yr  .  Nasal congestion   . Personal history of radiation therapy 02/2012  . Submucous myoma of uterus   . Use of megestrol acetate (Megace)    For uterine bleeding  . Uterine bleeding, dysfunctional    severe  . Wheezing    hx  - at allergy season only    Observations/Objective: A&O  No respiratory distress or wheezing audible over the phone Mood, judgement, and thought processes all WNL  Assessment and Plan: 1. Acute non-recurrent sinusitis, unspecified location - Patient declined steroids for now.  - amoxicillin-clavulanate (AUGMENTIN) 875-125 MG tablet; Take 1 tablet by mouth 2 (two) times daily for 7 days.  Dispense: 14 tablet;  Refill: 0  2. Mild persistent asthma without complication - Started patient on Singulair.  - montelukast (SINGULAIR) 10 MG tablet; Take 1 tablet (10 mg total) by mouth at bedtime.  Dispense: 30 tablet; Refill: 3   Follow Up Instructions:  I discussed the assessment and treatment plan with the patient. The patient was provided an opportunity to ask questions and all were answered. The patient agreed with the plan and demonstrated an understanding of the instructions.   The patient was advised to call back or seek an in-person evaluation if the symptoms worsen or if the condition fails to improve as anticipated.  The above assessment and management plan was discussed with the patient. The patient verbalized understanding of and has agreed to the management plan. Patient is aware to call the clinic if symptoms persist or worsen. Patient is aware when to return to the clinic for a follow-up visit. Patient educated on when it is appropriate to go to the emergency department.   Time call ended: 2:10 PM  I provided 10 minutes of non-face-to-face time during this encounter.  Hendricks Limes, MSN, APRN, FNP-C [facility] 10/30/19

## 2020-01-10 ENCOUNTER — Other Ambulatory Visit: Payer: Self-pay | Admitting: Hematology and Oncology

## 2020-01-10 DIAGNOSIS — Z1231 Encounter for screening mammogram for malignant neoplasm of breast: Secondary | ICD-10-CM

## 2020-01-27 ENCOUNTER — Other Ambulatory Visit: Payer: Self-pay

## 2020-01-27 ENCOUNTER — Ambulatory Visit
Admission: RE | Admit: 2020-01-27 | Discharge: 2020-01-27 | Disposition: A | Discharge status: Home / Self Care | Payer: BC Managed Care – PPO | Source: Ambulatory Visit | Attending: Hematology and Oncology | Admitting: Hematology and Oncology

## 2020-01-27 DIAGNOSIS — Z1231 Encounter for screening mammogram for malignant neoplasm of breast: Secondary | ICD-10-CM

## 2020-03-02 ENCOUNTER — Other Ambulatory Visit: Payer: Self-pay | Admitting: Family Medicine

## 2020-03-02 DIAGNOSIS — I10 Essential (primary) hypertension: Secondary | ICD-10-CM

## 2020-03-07 ENCOUNTER — Other Ambulatory Visit: Payer: Self-pay | Admitting: Family Medicine

## 2020-03-07 DIAGNOSIS — I10 Essential (primary) hypertension: Secondary | ICD-10-CM

## 2020-03-19 ENCOUNTER — Telehealth: Payer: Self-pay

## 2020-03-19 ENCOUNTER — Other Ambulatory Visit: Payer: Self-pay | Admitting: *Deleted

## 2020-03-19 DIAGNOSIS — I10 Essential (primary) hypertension: Secondary | ICD-10-CM

## 2020-03-19 MED ORDER — VALSARTAN 160 MG PO TABS: 160.0000 mg | ORAL_TABLET | Freq: Every day | ORAL | 1 refills | Status: DC

## 2020-03-19 NOTE — Telephone Encounter (Signed)
  Prescription Request  03/19/2020  What is the name of the medication or equipment?valsartan (DIOVAN) 160 MG tablet   Have you contacted your pharmacy to request a refill? (if applicable) Yes  Which pharmacy would you like this sent to? [facility] [place], pt has appt with DR. D on 04/29/20 needs enough until appt   Patient notified that their request is being sent to the clinical staff for review and that they should receive a response within 2 business days.

## 2020-03-31 ENCOUNTER — Other Ambulatory Visit: Payer: Self-pay | Admitting: Family Medicine

## 2020-04-24 ENCOUNTER — Other Ambulatory Visit: Payer: Self-pay | Admitting: Hematology and Oncology

## 2020-04-24 DIAGNOSIS — C50211 Malignant neoplasm of upper-inner quadrant of right female breast: Secondary | ICD-10-CM

## 2020-04-29 ENCOUNTER — Ambulatory Visit: Payer: BC Managed Care – PPO | Admitting: Family Medicine

## 2020-05-21 ENCOUNTER — Other Ambulatory Visit: Payer: Self-pay | Admitting: Family Medicine

## 2020-05-21 DIAGNOSIS — I10 Essential (primary) hypertension: Secondary | ICD-10-CM

## 2020-05-26 ENCOUNTER — Other Ambulatory Visit: Payer: Self-pay

## 2020-05-26 DIAGNOSIS — I10 Essential (primary) hypertension: Secondary | ICD-10-CM

## 2020-05-26 MED ORDER — VALSARTAN 160 MG PO TABS: 160.0000 mg | ORAL_TABLET | Freq: Every day | ORAL | 1 refills | Status: DC

## 2020-05-26 NOTE — Telephone Encounter (Signed)
  Prescription Request  05/26/2020  What is the name of the medication or equipment? Valsartan  Have you contacted your pharmacy to request a refill? (if applicable) Yes  Which pharmacy would you like this sent to? [facility] [place]  Pt scheduled to see Dr Dettinger on 3/16 for med refill but is completely out and needs enough medicine to last her until her appt.

## 2020-05-27 ENCOUNTER — Ambulatory Visit: Payer: Self-pay | Admitting: Family Medicine

## 2020-05-27 NOTE — Telephone Encounter (Signed)
Pt aware refill sent to pharmacy 

## 2020-06-21 NOTE — Assessment & Plan Note (Signed)
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 Denies any hot flashes or myalgias Patient has not taken tamoxifen for 2 years in the middle. It appears that she is tolerating the tamoxifen fairly well since she started exercising and eating better. Because of this we will continue tamoxifen for 2 more years.  Breast Cancer Surveillance: Mammogram 10/14/21No abnormalities. Postsurgical changes. Breast Density Category A.   Feb 2020 emergency room visit for syncope: CT head normal  RTC in 1 year

## 2020-06-22 ENCOUNTER — Other Ambulatory Visit: Payer: Self-pay

## 2020-06-22 ENCOUNTER — Inpatient Hospital Stay: Payer: BC Managed Care – PPO | Attending: Hematology and Oncology | Admitting: Hematology and Oncology

## 2020-06-22 ENCOUNTER — Telehealth: Payer: Self-pay | Admitting: Hematology and Oncology

## 2020-06-22 DIAGNOSIS — C50211 Malignant neoplasm of upper-inner quadrant of right female breast: Secondary | ICD-10-CM

## 2020-06-22 DIAGNOSIS — Z170 Estrogen receptor positive status [ER+]: Secondary | ICD-10-CM

## 2020-06-22 DIAGNOSIS — Z923 Personal history of irradiation: Secondary | ICD-10-CM | POA: Insufficient documentation

## 2020-06-22 DIAGNOSIS — D0511 Intraductal carcinoma in situ of right breast: Secondary | ICD-10-CM | POA: Diagnosis present

## 2020-06-22 MED ORDER — TAMOXIFEN CITRATE 20 MG PO TABS: 20.0000 mg | ORAL_TABLET | Freq: Every day | ORAL | 3 refills | Status: DC

## 2020-06-22 NOTE — Telephone Encounter (Signed)
Scheduled appts per 3/7 sch msg. Pt declined print out of AVS.

## 2020-06-22 NOTE — Progress Notes (Signed)
Patient Care Team: Dettinger, Fransisca Kaufmann, MD as PCP - General (Family Medicine)  DIAGNOSIS:    ICD-10-CM   1. 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 Mammogram on 01/30/20 showed no evidence of malignancy bilaterally. She presents to the clinic today for follow-up.   ALLERGIES:  is allergic to lisinopril, bee pollen, and oxycodone.  MEDICATIONS:  Current Outpatient Medications  Medication Sig Dispense Refill  . albuterol (PROVENTIL HFA;VENTOLIN HFA) 108 (90 Base) MCG/ACT inhaler INHALE 1 PUFF INTO LUNGS ONCE DAILY AS NEEDED FOR ASTHMA 9 g 0  . EVENING PRIMROSE OIL PO Take by mouth daily as needed.     . Fluticasone-Salmeterol (ADVAIR) 100-50 MCG/DOSE AEPB INHALE 1 DOSE BY MOUTH TWICE DAILY 60 each 5  . levocetirizine (XYZAL) 5 MG tablet Take 1 tablet (5 mg total) by mouth every evening. 90 tablet 3  . Menthol, Topical Analgesic, 4 % GEL APPLY TO AFFECTED AREA UP TO 4 TIMES DAILY 74 mL 2  . montelukast (SINGULAIR) 10 MG tablet Take 1 tablet (10 mg total) by mouth at bedtime. 30 tablet 3  . naproxen sodium (ANAPROX) 220 MG tablet Take 1 tablet (220 mg total) by mouth 2 (two) times daily as needed. 60 tablet 11  . omeprazole (PRILOSEC) 20 MG capsule Take 1 capsule by mouth once daily 90 capsule 0  . Spacer/Aero Chamber Mouthpiece MISC 1 each by Does not apply route every 6 (six) hours  as needed. 1 each 0  . tamoxifen (NOLVADEX) 20 MG tablet Take 1 tablet by mouth once daily 90 tablet 0  . valsartan (DIOVAN) 160 MG tablet Take 1 tablet (160 mg total) by mouth daily. 30 tablet 1   No current facility-administered medications for this visit.    PHYSICAL EXAMINATION: ECOG PERFORMANCE STATUS: 1 - Symptomatic but completely ambulatory  There were no vitals filed for this visit. There were no vitals filed for this visit.  BREAST: No palpable masses or nodules in either right or left breasts. No palpable axillary supraclavicular or infraclavicular adenopathy no breast tenderness or nipple discharge. (exam performed in the presence of a chaperone)  LABORATORY DATA:  I have reviewed the data as listed CMP Latest Ref Rng & Units 05/28/2018 05/25/2017 06/30/2016  Glucose 70 - 99 mg/dL 122(H) 85 88  BUN 6 - 20 mg/dL 17 10 11   Creatinine 0.44 - 1.00 mg/dL 0.93 0.90 0.85  Sodium 135 - 145 mmol/L 137 141 137  Potassium 3.5 - 5.1 mmol/L 3.9 3.9 4.7  Chloride 98 - 111 mmol/L 100 102 97  CO2 22 - 32 mmol/L 28 26 26   Calcium 8.9 - 10.3 mg/dL 9.1 9.4 9.5  Total Protein 6.5 - 8.1 g/dL 8.1 7.2 7.0  Total Bilirubin 0.3 - 1.2 mg/dL 0.7 0.4 0.3  Alkaline Phos 38 - 126 U/L 92 114 118(H)  AST 15 -  41 U/L 22 15 18   ALT 0 - 44 U/L 19 12 10     Lab Results  Component Value Date   WBC 8.8 05/28/2018   HGB 13.5 05/28/2018   HCT 45.2 05/28/2018   MCV 85.8 05/28/2018   PLT 177 05/28/2018   NEUTROABS 7.6 05/28/2018    ASSESSMENT & PLAN:  Carcinoma of upper-inner quadrant of right female breast ([place]) Rt Breast DCIS S/P Lumpectomy 2013 foll by XRT and now on tamoxifen since Feb 2014  Tamoxifen Toxicities: 1. Denies any hot flashes or myalgias Patient has not taken tamoxifen for 2 years in the middle. It appears that she is tolerating the tamoxifen fairly well since she started exercising and eating better. Because of this we will continue tamoxifen for 2 more years.  Breast Cancer  Surveillance: Mammogram 10/14/21No abnormalities. Postsurgical changes. Breast Density Category A. Breast exam 06/22/2020: Benign  Feb 2020 emergency room visit for syncope: CT head normal  RTC in 1 year    No orders of the defined types were placed in this encounter.  The patient has a good understanding of the overall plan. she agrees with it. she will call with any problems that may develop before the next visit here.  Total time spent: 20 mins including face to face time and time spent for planning, charting and coordination of care  Rulon Eisenmenger, MD, MPH 06/22/2020  I, Cloyde Reams Dorshimer, am acting as scribe for Dr. Nicholas Lose.  I have reviewed the above documentation for accuracy and completeness, and I agree with the above.

## 2020-07-01 ENCOUNTER — Other Ambulatory Visit: Payer: Self-pay

## 2020-07-01 ENCOUNTER — Ambulatory Visit (INDEPENDENT_AMBULATORY_CARE_PROVIDER_SITE_OTHER): Payer: BC Managed Care – PPO | Admitting: Family Medicine

## 2020-07-01 ENCOUNTER — Encounter: Payer: Self-pay | Admitting: Family Medicine

## 2020-07-01 VITALS — BP 142/79 | HR 73 | Ht 69.0 in | Wt 353.0 lb

## 2020-07-01 DIAGNOSIS — K219 Gastro-esophageal reflux disease without esophagitis: Secondary | ICD-10-CM | POA: Diagnosis not present

## 2020-07-01 DIAGNOSIS — D509 Iron deficiency anemia, unspecified: Secondary | ICD-10-CM | POA: Diagnosis not present

## 2020-07-01 DIAGNOSIS — E8881 Metabolic syndrome: Secondary | ICD-10-CM

## 2020-07-01 DIAGNOSIS — I10 Essential (primary) hypertension: Secondary | ICD-10-CM | POA: Diagnosis not present

## 2020-07-01 DIAGNOSIS — Z91030 Bee allergy status: Secondary | ICD-10-CM

## 2020-07-01 DIAGNOSIS — Z13220 Encounter for screening for lipoid disorders: Secondary | ICD-10-CM

## 2020-07-01 MED ORDER — VALSARTAN 160 MG PO TABS: 160.0000 mg | ORAL_TABLET | Freq: Every day | ORAL | 3 refills | Status: DC

## 2020-07-01 MED ORDER — OMEPRAZOLE 20 MG PO CPDR: 20.0000 mg | DELAYED_RELEASE_CAPSULE | Freq: Every day | ORAL | 3 refills | Status: DC

## 2020-07-01 MED ORDER — OZEMPIC (1 MG/DOSE) 2 MG/1.5ML ~~LOC~~ SOPN: 1.0000 mg | PEN_INJECTOR | SUBCUTANEOUS | 5 refills | Status: DC

## 2020-07-01 MED ORDER — EPINEPHRINE 0.3 MG/0.3ML IJ SOAJ: 0.3000 mg | INTRAMUSCULAR | 2 refills | Status: DC | PRN

## 2020-07-01 MED ORDER — FLUTICASONE PROPIONATE 50 MCG/ACT NA SUSP: 2.0000 | Freq: Every day | NASAL | 5 refills | Status: DC | PRN

## 2020-07-01 NOTE — Progress Notes (Signed)
BP (!) 142/79   Pulse 73   Ht 5' 9"  (1.753 m)   Wt (!) 353 lb (160.1 kg)   LMP 12/01/2011   SpO2 99%   BMI 52.13 kg/m    Subjective:   Patient ID: Kayla Neal, female    DOB: 09-Jan-1961, 60 y.o.   MRN: [id number]  HPI: Kayla Neal is a 60 y.o. female presenting on 07/01/2020 for Medical Management of Chronic Issues, Hypertension, and Gastroesophageal Reflux   HPI Hypertension Patient is currently on valsartan, and their blood pressure today is 142/79, she says it runs in the 120s and 130s at home and she has been checking it this week especially. Patient denies any lightheadedness or dizziness. Patient denies headaches, blurred vision, chest pains, shortness of breath, or weakness. Denies any side effects from medication and is content with current medication.   GERD Patient is currently on omeprazole.  She denies any major symptoms or abdominal pain or belching or burping. She denies any blood in her stool or lightheadedness or dizziness.  She does not take omeprazole every day but uses it sometimes and it works well  Iron deficiency anemia recheck Patient is coming in for iron deficiency recheck.  She says she is doing well and denies any lightheadedness or dizziness or chest pain or palpitations or fatigue or blood in her stool.  Patient is coming in for morbid obesity and weight recheck.  She says she is just been struggling and is gaining weight back and having more issues.  She has very clear centripetal obesity.   Relevant past medical, surgical, family and social history reviewed and updated as indicated. Interim medical history since our last visit reviewed. Allergies and medications reviewed and updated.  Review of Systems  Constitutional: Negative for chills and fever.  HENT: Negative for congestion, ear discharge and ear pain.   Eyes: Negative for redness and visual disturbance.  Respiratory: Negative for chest tightness and shortness of breath.    Cardiovascular: Negative for chest pain and leg swelling.  Genitourinary: Negative for difficulty urinating and dysuria.  Musculoskeletal: Negative for back pain and gait problem.  Skin: Negative for rash.  Neurological: Negative for light-headedness and headaches.  Psychiatric/Behavioral: Negative for agitation and behavioral problems.  All other systems reviewed and are negative.   Per HPI unless specifically indicated above   Allergies as of 07/01/2020      Reactions   Lisinopril Nausea And Vomiting, Other (See Comments)   Severe stomach cramps   Bee Pollen Hives   Has Hives with Bee Stings   Oxycodone Other (See Comments)   Patient prefers not to be on this med.  It makes her very loopy and unable to function.      Medication List       Accurate as of July 01, 2020  4:50 PM. If you have any questions, ask your nurse or doctor.        albuterol 108 (90 Base) MCG/ACT inhaler Commonly known as: VENTOLIN HFA INHALE 1 PUFF INTO LUNGS ONCE DAILY AS NEEDED FOR ASTHMA   EPINEPHrine 0.3 mg/0.3 mL Soaj injection Commonly known as: EPI-PEN Inject 0.3 mg into the muscle as needed for anaphylaxis. Place on hold What changed: additional instructions Changed by: Fransisca Kaufmann Dettinger, MD   EVENING PRIMROSE OIL PO Take by mouth daily as needed.   fluticasone 50 MCG/ACT nasal spray Commonly known as: FLONASE Place 2 sprays into both nostrils daily as needed for allergies or rhinitis. Place on  hold What changed:   how much to take  when to take this  reasons to take this  additional instructions Changed by: Fransisca Kaufmann Dettinger, MD   Fluticasone-Salmeterol 100-50 MCG/DOSE Aepb Commonly known as: ADVAIR INHALE 1 DOSE BY MOUTH TWICE DAILY   levocetirizine 5 MG tablet Commonly known as: XYZAL Take 1 tablet (5 mg total) by mouth every evening.   Menthol (Topical Analgesic) 4 % Gel APPLY TO AFFECTED AREA UP TO 4 TIMES DAILY   montelukast 10 MG tablet Commonly known as:  SINGULAIR Take 1 tablet (10 mg total) by mouth at bedtime.   naproxen sodium 220 MG tablet Commonly known as: ALEVE Take 1 tablet (220 mg total) by mouth 2 (two) times daily as needed.   omeprazole 20 MG capsule Commonly known as: PRILOSEC Take 1 capsule (20 mg total) by mouth daily. Place on hold What changed: additional instructions Changed by: Fransisca Kaufmann Dettinger, MD   Ozempic (1 MG/DOSE) 2 MG/1.5ML Sopn Generic drug: Semaglutide (1 MG/DOSE) Inject 1 mg into the skin once a week. Started by: Worthy Rancher, MD   Spacer/Aero Chamber Mouthpiece Misc 1 each by Does not apply route every 6 (six) hours as needed.   tamoxifen 20 MG tablet Commonly known as: NOLVADEX Take 1 tablet (20 mg total) by mouth daily.   valsartan 160 MG tablet Commonly known as: DIOVAN Take 1 tablet (160 mg total) by mouth daily. Place on hold What changed: additional instructions Changed by: Fransisca Kaufmann Dettinger, MD        Objective:   BP (!) 142/79   Pulse 73   Ht 5' 9"  (1.753 m)   Wt (!) 353 lb (160.1 kg)   LMP 12/01/2011   SpO2 99%   BMI 52.13 kg/m   Wt Readings from Last 3 Encounters:  07/01/20 (!) 353 lb (160.1 kg)  06/22/20 (!) 349 lb 12.8 oz (158.7 kg)  06/06/18 (!) 326 lb 9.6 oz (148.1 kg)    Physical Exam Vitals and nursing note reviewed.  Constitutional:      General: She is not in acute distress.    Appearance: She is well-developed. She is not diaphoretic.  Eyes:     Conjunctiva/sclera: Conjunctivae normal.  Cardiovascular:     Rate and Rhythm: Normal rate and regular rhythm.     Heart sounds: Normal heart sounds. No murmur heard.   Pulmonary:     Effort: Pulmonary effort is normal. No respiratory distress.     Breath sounds: Normal breath sounds. No wheezing.  Musculoskeletal:        General: No tenderness. Normal range of motion.  Skin:    General: Skin is warm and dry.     Findings: No rash.  Neurological:     Mental Status: She is alert and oriented to  person, place, and time.     Coordination: Coordination normal.  Psychiatric:        Behavior: Behavior normal.    Centripetal obesity with very large waist circumference   Assessment & Plan:   Problem List Items Addressed This Visit      Cardiovascular and Mediastinum   Essential hypertension   Relevant Medications   EPINEPHrine 0.3 mg/0.3 mL IJ SOAJ injection   valsartan (DIOVAN) 160 MG tablet   Other Relevant Orders   CMP14+EGFR     Digestive   GERD (gastroesophageal reflux disease)   Relevant Medications   omeprazole (PRILOSEC) 20 MG capsule   Other Relevant Orders   CBC with Differential/Platelet  Other   Iron deficiency anemia - Primary   Relevant Orders   CBC with Differential/Platelet    Other Visit Diagnoses    Bee sting allergy       Relevant Medications   EPINEPHrine 0.3 mg/0.3 mL IJ SOAJ injection   Lipid screening       Relevant Orders   CMP14+EGFR   Lipid panel   Morbid obesity ([facility])       Relevant Medications   Semaglutide, 1 MG/DOSE, (OZEMPIC, 1 MG/DOSE,) 2 MG/1.5ML SOPN   Other Relevant Orders   CMP14+EGFR   Lipid panel   Metabolic syndrome       Relevant Medications   Semaglutide, 1 MG/DOSE, (OZEMPIC, 1 MG/DOSE,) 2 MG/1.5ML SOPN      Will prescribe Ozempic for weight loss based on her metabolic syndrome and morbid obesity.  She says she did not do well on the phentermine because of palpitations Follow up plan: Return in about 8 weeks (around 08/26/2020), or if symptoms worsen or fail to improve, for Morbid obesity weight recheck.  Counseling provided for all of the vaccine components Orders Placed This Encounter  Procedures  . CBC with Differential/Platelet  . CMP14+EGFR  . Lipid panel    Caryl Pina, MD [facility] 07/01/2020, 4:50 PM

## 2020-07-02 ENCOUNTER — Other Ambulatory Visit: Payer: Self-pay | Admitting: *Deleted

## 2020-07-02 LAB — CMP14+EGFR
ALT: 15 IU/L (ref 0–32)
AST: 18 IU/L (ref 0–40)
Albumin/Globulin Ratio: 1.5 (ref 1.2–2.2)
Albumin: 4.5 g/dL (ref 3.8–4.9)
Alkaline Phosphatase: 125 IU/L — ABNORMAL HIGH (ref 44–121)
BUN/Creatinine Ratio: 14 (ref 9–23)
BUN: 12 mg/dL (ref 6–24)
Bilirubin Total: 0.4 mg/dL (ref 0.0–1.2)
CO2: 25 mmol/L (ref 20–29)
Calcium: 9.8 mg/dL (ref 8.7–10.2)
Chloride: 99 mmol/L (ref 96–106)
Creatinine, Ser: 0.86 mg/dL (ref 0.57–1.00)
Globulin, Total: 3.1 g/dL (ref 1.5–4.5)
Glucose: 94 mg/dL (ref 65–99)
Potassium: 4.3 mmol/L (ref 3.5–5.2)
Sodium: 138 mmol/L (ref 134–144)
Total Protein: 7.6 g/dL (ref 6.0–8.5)
eGFR: 78 mL/min/{1.73_m2} (ref >59)

## 2020-07-02 LAB — CBC WITH DIFFERENTIAL/PLATELET
Basophils Absolute: 0 10*3/uL (ref 0.0–0.2)
Basos: 1 %
EOS (ABSOLUTE): 0.1 10*3/uL (ref 0.0–0.4)
Eos: 2 %
Hematocrit: 41.1 % (ref 34.0–46.6)
Hemoglobin: 13.3 g/dL (ref 11.1–15.9)
Immature Grans (Abs): 0 10*3/uL (ref 0.0–0.1)
Immature Granulocytes: 0 %
Lymphocytes Absolute: 1.3 10*3/uL (ref 0.7–3.1)
Lymphs: 30 %
MCH: 26.1 pg — ABNORMAL LOW (ref 26.6–33.0)
MCHC: 32.4 g/dL (ref 31.5–35.7)
MCV: 81 fL (ref 79–97)
Monocytes Absolute: 0.4 10*3/uL (ref 0.1–0.9)
Monocytes: 10 %
Neutrophils Absolute: 2.6 10*3/uL (ref 1.4–7.0)
Neutrophils: 57 %
Platelets: 187 10*3/uL (ref 150–450)
RBC: 5.09 x10E6/uL (ref 3.77–5.28)
RDW: 13.7 % (ref 11.7–15.4)
WBC: 4.4 10*3/uL (ref 3.4–10.8)

## 2020-07-02 LAB — LIPID PANEL
Chol/HDL Ratio: 4.5 ratio — ABNORMAL HIGH (ref 0.0–4.4)
Cholesterol, Total: 285 mg/dL — ABNORMAL HIGH (ref 100–199)
HDL: 64 mg/dL (ref >39)
LDL Chol Calc (NIH): 191 mg/dL — ABNORMAL HIGH (ref 0–99)
Triglycerides: 163 mg/dL — ABNORMAL HIGH (ref 0–149)
VLDL Cholesterol Cal: 30 mg/dL (ref 5–40)

## 2020-07-02 MED ORDER — ROSUVASTATIN CALCIUM 10 MG PO TABS: 10.0000 mg | ORAL_TABLET | Freq: Every day | ORAL | 1 refills | Status: DC

## 2020-07-02 NOTE — Progress Notes (Signed)
crestor started for pt - per labs  sent in

## 2020-08-06 ENCOUNTER — Telehealth: Payer: Self-pay

## 2020-08-06 NOTE — Telephone Encounter (Signed)
Spoke with patient and she s going to try dialing up a half of a dose and injecting then drawing up the other half and injecting in othe rleg. Sh will let us know hw it works.

## 2020-08-21 ENCOUNTER — Other Ambulatory Visit: Payer: Self-pay | Admitting: Family Medicine

## 2020-08-21 DIAGNOSIS — J4530 Mild persistent asthma, uncomplicated: Secondary | ICD-10-CM

## 2020-08-27 ENCOUNTER — Ambulatory Visit: Payer: BC Managed Care – PPO | Admitting: Family Medicine

## 2020-08-28 ENCOUNTER — Encounter: Payer: Self-pay | Admitting: Family Medicine

## 2020-11-02 ENCOUNTER — Encounter: Payer: Self-pay | Admitting: Nurse Practitioner

## 2020-11-02 ENCOUNTER — Ambulatory Visit (INDEPENDENT_AMBULATORY_CARE_PROVIDER_SITE_OTHER): Payer: BC Managed Care – PPO | Admitting: Nurse Practitioner

## 2020-11-02 DIAGNOSIS — U071 COVID-19: Secondary | ICD-10-CM | POA: Diagnosis not present

## 2020-11-02 MED ORDER — NIRMATRELVIR/RITONAVIR (PAXLOVID)TABLET: 3.0000 | ORAL_TABLET | Freq: Two times a day (BID) | ORAL | 0 refills | Status: AC

## 2020-11-02 NOTE — Assessment & Plan Note (Signed)
Patient tested positive at home COVID-19 test kit.  Started patient on Paxlovid.  Education provided to patient.  Rx sent to pharmacy.

## 2020-11-02 NOTE — Progress Notes (Signed)
   Virtual Visit  Note Due to COVID-19 pandemic this visit was conducted virtually. This visit type was conducted due to national recommendations for restrictions regarding the COVID-19 Pandemic (e.g. social distancing, sheltering in place) in an effort to limit this patient's exposure and mitigate transmission in our community. All issues noted in this document were discussed and addressed.  A physical exam was not performed with this format.  I connected with Kayla Neal on 11/02/20 at 10:20 am by telephone and verified that I am speaking with the correct person using two identifiers. Kayla Neal is currently located at home during visit. The provider, Ivy Lynn, NP is located in their office at time of visit.  I discussed the limitations, risks, security and privacy concerns of performing an evaluation and management service by telephone and the availability of in person appointments. I also discussed with the patient that there may be a patient responsible charge related to this service. The patient expressed understanding and agreed to proceed.   History and Present Illness:  Sore Throat  This is a new problem. The current episode started yesterday. The problem has been gradually worsening. There has been no fever. The pain is moderate. Associated symptoms include coughing and headaches. Pertinent negatives include no abdominal pain or swollen glands. She has had no exposure to strep. She has tried nothing for the symptoms.     Review of Systems  Constitutional:  Negative for chills and fever.  Respiratory:  Positive for cough.   Gastrointestinal:  Negative for abdominal pain.  Skin:  Negative for rash.  Neurological:  Positive for headaches.  All other systems reviewed and are negative.   Observations/Objective: Televisit patient not in distress.  Assessment and Plan: Patient tested positive at home COVID-19 test kit.  Started patient on Paxlovid.  Education  provided to patient.  Rx sent to pharmacy.  Follow Up Instructions: Follow-up with worsening or unresolved symptoms.   I discussed the assessment and treatment plan with the patient. The patient was provided an opportunity to ask questions and all were answered. The patient agreed with the plan and demonstrated an understanding of the instructions.   The patient was advised to call back or seek an in-person evaluation if the symptoms worsen or if the condition fails to improve as anticipated.  The above assessment and management plan was discussed with the patient. The patient verbalized understanding of and has agreed to the management plan. Patient is aware to call the clinic if symptoms persist or worsen. Patient is aware when to return to the clinic for a follow-up visit. Patient educated on when it is appropriate to go to the emergency department.   Time call ended: 10:30 AM  I provided 10 minutes of  non face-to-face time during this encounter.    Ivy Lynn, NP

## 2020-11-16 ENCOUNTER — Other Ambulatory Visit: Payer: Self-pay | Admitting: *Deleted

## 2020-11-16 DIAGNOSIS — J4530 Mild persistent asthma, uncomplicated: Secondary | ICD-10-CM

## 2020-11-16 MED ORDER — ALBUTEROL SULFATE HFA 108 (90 BASE) MCG/ACT IN AERS: INHALATION_SPRAY | RESPIRATORY_TRACT | 0 refills | Status: DC

## 2021-01-06 ENCOUNTER — Other Ambulatory Visit: Payer: Self-pay | Admitting: Hematology and Oncology

## 2021-01-06 DIAGNOSIS — Z1231 Encounter for screening mammogram for malignant neoplasm of breast: Secondary | ICD-10-CM

## 2021-01-23 ENCOUNTER — Other Ambulatory Visit: Payer: Self-pay | Admitting: Family Medicine

## 2021-01-23 DIAGNOSIS — E8881 Metabolic syndrome: Secondary | ICD-10-CM

## 2021-02-02 ENCOUNTER — Encounter: Payer: Self-pay | Admitting: Adult Health

## 2021-02-02 ENCOUNTER — Other Ambulatory Visit: Payer: Self-pay

## 2021-02-02 ENCOUNTER — Ambulatory Visit
Admission: RE | Admit: 2021-02-02 | Discharge: 2021-02-02 | Disposition: A | Discharge status: Home / Self Care | Payer: BC Managed Care – PPO | Source: Ambulatory Visit | Attending: Hematology and Oncology | Admitting: Hematology and Oncology

## 2021-02-02 DIAGNOSIS — Z1231 Encounter for screening mammogram for malignant neoplasm of breast: Secondary | ICD-10-CM

## 2021-02-03 ENCOUNTER — Other Ambulatory Visit: Payer: Self-pay | Admitting: Family Medicine

## 2021-02-03 DIAGNOSIS — J4530 Mild persistent asthma, uncomplicated: Secondary | ICD-10-CM

## 2021-03-02 ENCOUNTER — Other Ambulatory Visit: Payer: Self-pay | Admitting: Family Medicine

## 2021-03-02 DIAGNOSIS — E8881 Metabolic syndrome: Secondary | ICD-10-CM

## 2021-03-02 NOTE — Telephone Encounter (Signed)
Dettinger NTBS 30 days given 01/25/21

## 2021-03-03 ENCOUNTER — Encounter: Payer: Self-pay | Admitting: Family Medicine

## 2021-03-03 NOTE — Telephone Encounter (Signed)
Lmtcb/letter sent

## 2021-03-23 ENCOUNTER — Other Ambulatory Visit: Payer: Self-pay | Admitting: Family Medicine

## 2021-03-23 DIAGNOSIS — E8881 Metabolic syndrome: Secondary | ICD-10-CM

## 2021-04-01 ENCOUNTER — Other Ambulatory Visit: Payer: Self-pay | Admitting: Hematology and Oncology

## 2021-04-01 DIAGNOSIS — C50211 Malignant neoplasm of upper-inner quadrant of right female breast: Secondary | ICD-10-CM

## 2021-04-01 NOTE — Telephone Encounter (Signed)
Per 06/22/2020 note, continue tamoxifen for 2 more years.

## 2021-04-13 ENCOUNTER — Other Ambulatory Visit: Payer: Self-pay | Admitting: Hematology and Oncology

## 2021-04-13 DIAGNOSIS — C50211 Malignant neoplasm of upper-inner quadrant of right female breast: Secondary | ICD-10-CM

## 2021-04-14 ENCOUNTER — Encounter: Payer: Self-pay | Admitting: Adult Health

## 2021-04-30 ENCOUNTER — Ambulatory Visit: Payer: BC Managed Care – PPO | Admitting: Family Medicine

## 2021-05-17 ENCOUNTER — Encounter: Payer: Self-pay | Admitting: Family Medicine

## 2021-05-17 ENCOUNTER — Ambulatory Visit (INDEPENDENT_AMBULATORY_CARE_PROVIDER_SITE_OTHER): Payer: BC Managed Care – PPO | Admitting: Family Medicine

## 2021-05-17 VITALS — BP 136/82 | HR 70 | Ht 69.0 in | Wt 329.0 lb

## 2021-05-17 DIAGNOSIS — E8881 Metabolic syndrome: Secondary | ICD-10-CM | POA: Diagnosis not present

## 2021-05-17 DIAGNOSIS — J4530 Mild persistent asthma, uncomplicated: Secondary | ICD-10-CM

## 2021-05-17 DIAGNOSIS — I10 Essential (primary) hypertension: Secondary | ICD-10-CM

## 2021-05-17 DIAGNOSIS — K219 Gastro-esophageal reflux disease without esophagitis: Secondary | ICD-10-CM

## 2021-05-17 DIAGNOSIS — E785 Hyperlipidemia, unspecified: Secondary | ICD-10-CM | POA: Insufficient documentation

## 2021-05-17 DIAGNOSIS — E782 Mixed hyperlipidemia: Secondary | ICD-10-CM

## 2021-05-17 LAB — LIPID PANEL

## 2021-05-17 MED ORDER — ROSUVASTATIN CALCIUM 10 MG PO TABS: 10.0000 mg | ORAL_TABLET | Freq: Every day | ORAL | 3 refills | Status: DC

## 2021-05-17 MED ORDER — MONTELUKAST SODIUM 10 MG PO TABS: 10.0000 mg | ORAL_TABLET | Freq: Every day | ORAL | 3 refills | Status: DC

## 2021-05-17 MED ORDER — VALSARTAN 160 MG PO TABS: 160.0000 mg | ORAL_TABLET | Freq: Every day | ORAL | 3 refills | Status: DC

## 2021-05-17 MED ORDER — OMEPRAZOLE 20 MG PO CPDR: 20.0000 mg | DELAYED_RELEASE_CAPSULE | Freq: Every day | ORAL | 3 refills | Status: DC

## 2021-05-17 MED ORDER — OZEMPIC (1 MG/DOSE) 4 MG/3ML ~~LOC~~ SOPN: 1.0000 mg | PEN_INJECTOR | SUBCUTANEOUS | 1 refills | Status: DC

## 2021-05-17 NOTE — Progress Notes (Signed)
BP 136/82    Pulse 70    Ht 5' 9" (1.753 m)    Wt (!) 329 lb (149.2 kg)    LMP 12/01/2011    SpO2 100%    BMI 48.58 kg/m    Subjective:   Patient ID: Kayla Neal, female    DOB: [birth year], 61 y.o.   MRN: [id number]  HPI: Kayla Neal is a 61 y.o. female presenting on 05/17/2021 for Medical Management of Chronic Issues, Hypertension, Gastroesophageal Reflux, and sinus pressure   HPI Hypertension Patient is currently on valsartan, and their blood pressure today is 144/85. Patient denies any lightheadedness or dizziness. Patient denies headaches, blurred vision, chest pains, shortness of breath, or weakness. Denies any side effects from medication and is content with current medication.   Hyperlipidemia Patient is coming in for recheck of his hyperlipidemia. The patient is currently taking Crestor. They deny any issues with myalgias or history of liver damage from it. They deny any focal numbness or weakness or chest pain.   GERD Patient is currently on omeprazole.  She denies any major symptoms or abdominal pain or belching or burping. She denies any blood in her stool or lightheadedness or dizziness.   Patient has metabolic syndrome and she is down 23 pounds Ozempic and she has been off of it for least a couple months and she has maintained.  She would like to try another course of it.  Her insurance did cover it last year so we will see if it covers this year.  She has a BMI of 48 with centripetal obesity.  Relevant past medical, surgical, family and social history reviewed and updated as indicated. Interim medical history since our last visit reviewed. Allergies and medications reviewed and updated.  Review of Systems  Constitutional:  Negative for chills and fever.  HENT:  Positive for sinus pressure (Patient has a little bit of sinus pressure.  She says is not bad enough to want any medicine at this point but we will discuss it in the future). Negative for congestion,  ear discharge and ear pain.   Eyes:  Negative for redness and visual disturbance.  Respiratory:  Negative for chest tightness and shortness of breath.   Cardiovascular:  Negative for chest pain and leg swelling.  Genitourinary:  Negative for difficulty urinating and dysuria.  Musculoskeletal:  Negative for back pain and gait problem.  Skin:  Negative for rash.  Neurological:  Negative for light-headedness and headaches.  Psychiatric/Behavioral:  Negative for agitation and behavioral problems.   All other systems reviewed and are negative.  Per HPI unless specifically indicated above   Allergies as of 05/17/2021       Reactions   Lisinopril Nausea And Vomiting, Other (See Comments)   Severe stomach cramps   Bee Pollen Hives   Has Hives with Bee Stings   Oxycodone Other (See Comments)   Patient prefers not to be on this med.  It makes her very loopy and unable to function.        Medication List        Accurate as of May 17, 2021  8:28 AM. If you have any questions, ask your nurse or doctor.          albuterol 108 (90 Base) MCG/ACT inhaler Commonly known as: VENTOLIN HFA INHALE 1 PUFF INTO LUNGS ONCE DAILY AS NEEDED FOR ASTHMA   EPINEPHrine 0.3 mg/0.3 mL Soaj injection Commonly known as: EPI-PEN Inject 0.3 mg into the muscle  as needed for anaphylaxis. Place on hold   EVENING PRIMROSE OIL PO Take by mouth daily as needed.   fluticasone 50 MCG/ACT nasal spray Commonly known as: FLONASE Place 2 sprays into both nostrils daily as needed for allergies or rhinitis. Place on hold   levocetirizine 5 MG tablet Commonly known as: XYZAL Take 1 tablet (5 mg total) by mouth every evening.   Menthol (Topical Analgesic) 4 % Gel APPLY TO AFFECTED AREA UP TO 4 TIMES DAILY   montelukast 10 MG tablet Commonly known as: SINGULAIR Take 1 tablet (10 mg total) by mouth at bedtime. What changed: additional instructions Changed by: Fransisca Kaufmann Dettinger, MD   naproxen sodium 220  MG tablet Commonly known as: ALEVE Take 1 tablet (220 mg total) by mouth 2 (two) times daily as needed.   omeprazole 20 MG capsule Commonly known as: PRILOSEC Take 1 capsule (20 mg total) by mouth daily. Place on hold   Ozempic (1 MG/DOSE) 4 MG/3ML Sopn Generic drug: Semaglutide (1 MG/DOSE) Inject 1 mg into the skin once a week.   rosuvastatin 10 MG tablet Commonly known as: Crestor Take 1 tablet (10 mg total) by mouth daily.   Spacer/Aero Chamber Mouthpiece Misc 1 each by Does not apply route every 6 (six) hours as needed.   tamoxifen 20 MG tablet Commonly known as: NOLVADEX Take 1 tablet by mouth once daily   valsartan 160 MG tablet Commonly known as: DIOVAN Take 1 tablet (160 mg total) by mouth daily. Place on hold         Objective:   BP 136/82    Pulse 70    Ht 5' 9" (1.753 m)    Wt (!) 329 lb (149.2 kg)    LMP 12/01/2011    SpO2 100%    BMI 48.58 kg/m   Wt Readings from Last 3 Encounters:  05/17/21 (!) 329 lb (149.2 kg)  07/01/20 (!) 353 lb (160.1 kg)  06/22/20 (!) 349 lb 12.8 oz (158.7 kg)    Physical Exam Vitals and nursing note reviewed.  Constitutional:      General: She is not in acute distress.    Appearance: She is well-developed. She is not diaphoretic.  Eyes:     Conjunctiva/sclera: Conjunctivae normal.  Cardiovascular:     Rate and Rhythm: Normal rate and regular rhythm.     Heart sounds: Normal heart sounds. No murmur heard. Pulmonary:     Effort: Pulmonary effort is normal. No respiratory distress.     Breath sounds: Normal breath sounds. No wheezing.  Musculoskeletal:        General: No tenderness. Normal range of motion.  Skin:    General: Skin is warm and dry.     Findings: No rash.  Neurological:     Mental Status: She is alert and oriented to person, place, and time.     Coordination: Coordination normal.  Psychiatric:        Behavior: Behavior normal.      Assessment & Plan:   Problem List Items Addressed This Visit        Cardiovascular and Mediastinum   Essential hypertension - Primary   Relevant Medications   valsartan (DIOVAN) 160 MG tablet   rosuvastatin (CRESTOR) 10 MG tablet   Other Relevant Orders   CMP14+EGFR     Respiratory   Asthma   Relevant Medications   montelukast (SINGULAIR) 10 MG tablet     Digestive   GERD (gastroesophageal reflux disease)   Relevant Medications  omeprazole (PRILOSEC) 20 MG capsule   Other Relevant Orders   CBC with Differential/Platelet     Other   Hyperlipidemia   Relevant Medications   valsartan (DIOVAN) 160 MG tablet   rosuvastatin (CRESTOR) 10 MG tablet   Other Relevant Orders   Lipid panel   Other Visit Diagnoses     Morbid obesity ([place])       Relevant Medications   Semaglutide, 1 MG/DOSE, (OZEMPIC, 1 MG/DOSE,) 4 KF/8MC SOPN   Metabolic syndrome       Relevant Medications   Semaglutide, 1 MG/DOSE, (OZEMPIC, 1 MG/DOSE,) 4 MG/3ML SOPN       Refill medicine for patient, blood pressures she has run a lot better at home.  We will monitor closely for now.  She checks it at least every other day at home.  Will check blood work.  Follow-up in 6 months Follow up plan: Return in about 6 months (around 11/14/2021), or if symptoms worsen or fail to improve, for Hypertension hyperlipidemia GERD.  Counseling provided for all of the vaccine components Orders Placed This Encounter  Procedures   CBC with Differential/Platelet   CMP14+EGFR   Lipid panel    Caryl Pina, MD [facility] 05/17/2021, 8:28 AM

## 2021-05-18 LAB — CMP14+EGFR
ALT: 15 IU/L (ref 0–32)
AST: 20 IU/L (ref 0–40)
Albumin/Globulin Ratio: 1.6 (ref 1.2–2.2)
Albumin: 4.2 g/dL (ref 3.8–4.9)
Alkaline Phosphatase: 131 IU/L — ABNORMAL HIGH (ref 44–121)
BUN/Creatinine Ratio: 17 (ref 9–23)
BUN: 15 mg/dL (ref 6–24)
Bilirubin Total: 0.3 mg/dL (ref 0.0–1.2)
CO2: 27 mmol/L (ref 20–29)
Calcium: 9.6 mg/dL (ref 8.7–10.2)
Chloride: 104 mmol/L (ref 96–106)
Creatinine, Ser: 0.89 mg/dL (ref 0.57–1.00)
Globulin, Total: 2.6 g/dL (ref 1.5–4.5)
Glucose: 98 mg/dL (ref 70–99)
Potassium: 4.7 mmol/L (ref 3.5–5.2)
Sodium: 143 mmol/L (ref 134–144)
Total Protein: 6.8 g/dL (ref 6.0–8.5)
eGFR: 75 mL/min/{1.73_m2} (ref >59)

## 2021-05-18 LAB — CBC WITH DIFFERENTIAL/PLATELET
Basophils Absolute: 0 10*3/uL (ref 0.0–0.2)
Basos: 0 %
EOS (ABSOLUTE): 0.1 10*3/uL (ref 0.0–0.4)
Eos: 3 %
Hematocrit: 39.7 % (ref 34.0–46.6)
Hemoglobin: 12.8 g/dL (ref 11.1–15.9)
Immature Grans (Abs): 0 10*3/uL (ref 0.0–0.1)
Immature Granulocytes: 0 %
Lymphocytes Absolute: 1.7 10*3/uL (ref 0.7–3.1)
Lymphs: 35 %
MCH: 26 pg — ABNORMAL LOW (ref 26.6–33.0)
MCHC: 32.2 g/dL (ref 31.5–35.7)
MCV: 81 fL (ref 79–97)
Monocytes Absolute: 0.3 10*3/uL (ref 0.1–0.9)
Monocytes: 7 %
Neutrophils Absolute: 2.7 10*3/uL (ref 1.4–7.0)
Neutrophils: 55 %
Platelets: 201 10*3/uL (ref 150–450)
RBC: 4.92 x10E6/uL (ref 3.77–5.28)
RDW: 13 % (ref 11.7–15.4)
WBC: 4.8 10*3/uL (ref 3.4–10.8)

## 2021-05-18 LAB — LIPID PANEL
Chol/HDL Ratio: 3.8 ratio (ref 0.0–4.4)
Cholesterol, Total: 226 mg/dL — ABNORMAL HIGH (ref 100–199)
HDL: 59 mg/dL (ref >39)
LDL Chol Calc (NIH): 150 mg/dL — ABNORMAL HIGH (ref 0–99)
Triglycerides: 94 mg/dL (ref 0–149)
VLDL Cholesterol Cal: 17 mg/dL (ref 5–40)

## 2021-06-21 NOTE — Progress Notes (Signed)
? ?Patient Care Team: ?Dettinger, Fransisca Kaufmann, MD as PCP - General (Family Medicine) ? ?DIAGNOSIS:  ?  ICD-10-CM   ?1. Carcinoma of upper-inner quadrant of right breast in female, estrogen receptor positive ([place])  C50.211   ? Z17.0   ?  ? ? ?SUMMARY OF ONCOLOGIC HISTORY: ?Oncology History  ?Carcinoma of upper-inner quadrant of right female breast ([place])  ?11/14/2011 Surgery  ? Rt Lumpectomy: DCIS high grade 2.4 cm, Er 100%, PR 80% ?  ?02/13/2012 - 03/30/2012 Radiation Therapy  ? Adj XRT by Dr.Palermo ?  ?05/28/2012 -  Anti-estrogen oral therapy  ? Tamoxifen 20 mg daily x 5 yrs ?  ? ? 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 ?INTERVAL HISTORY: Kayla Neal is a 61 y.o. 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 Mammogram on 02/02/2021 showed no evidence of malignancy bilaterally. She presents to the clinic today for follow-up.  She is tolerating tamoxifen extremely well without any problems or concerns. ? ?ALLERGIES:  is allergic to lisinopril, bee pollen, and oxycodone. ? ?MEDICATIONS:  ?Current Outpatient Medications  ?Medication Sig Dispense Refill  ? albuterol (VENTOLIN HFA) 108 (90 Base) MCG/ACT inhaler INHALE 1 PUFF INTO LUNGS ONCE DAILY AS NEEDED FOR ASTHMA 8 g 0  ? EPINEPHrine 0.3 mg/0.3 mL IJ SOAJ injection Inject 0.3 mg into the muscle as needed for anaphylaxis. Place on hold 1 each 2  ? EVENING PRIMROSE OIL PO Take by mouth daily as needed.     ? fluticasone (FLONASE) 50 MCG/ACT nasal spray Place 2 sprays into both nostrils daily as needed for allergies or rhinitis. Place on hold 16 g 5  ? levocetirizine (XYZAL) 5 MG tablet Take 1 tablet (5 mg total) by mouth every evening. 90 tablet 3  ? Menthol, Topical Analgesic, 4 % GEL APPLY TO AFFECTED AREA UP TO 4 TIMES DAILY 74 mL 2  ? montelukast (SINGULAIR) 10 MG tablet Take 1 tablet (10 mg total) by mouth at bedtime. 90 tablet 3  ? naproxen sodium (ANAPROX) 220 MG tablet Take 1 tablet (220 mg total)  by mouth 2 (two) times daily as needed. 60 tablet 11  ? omeprazole (PRILOSEC) 20 MG capsule Take 1 capsule (20 mg total) by mouth daily. Place on hold 90 capsule 3  ? rosuvastatin (CRESTOR) 10 MG tablet Take 1 tablet (10 mg total) by mouth daily. 90 tablet 3  ? Semaglutide, 1 MG/DOSE, (OZEMPIC, 1 MG/DOSE,) 4 MG/3ML SOPN Inject 1 mg into the skin once a week. 3 mL 1  ? Spacer/Aero Chamber Mouthpiece MISC 1 each by Does not apply route every 6 (six) hours as needed. 1 each 0  ? tamoxifen (NOLVADEX) 20 MG tablet Take 1 tablet by mouth once daily 90 tablet 0  ? valsartan (DIOVAN) 160 MG tablet Take 1 tablet (160 mg total) by mouth daily. Place on hold 90 tablet 3  ? ?No current facility-administered medications for this visit.  ? ? ?PHYSICAL EXAMINATION: ?ECOG PERFORMANCE STATUS: 1 - Symptomatic but completely ambulatory ? ?Vitals:  ? 06/22/21 1542  ?BP: 129/78  ?Pulse: 73  ?Resp: 19  ?Temp: (!) 97.3 ?F (36.3 ?C)  ?SpO2: 100%  ? ?Filed Weights  ? 06/22/21 1542  ?Weight: (!) 327 lb 4.8 oz (148.5 kg)  ? ? ?BREAST: No palpable masses or nodules in either right or left breasts. No palpable axillary supraclavicular or infraclavicular adenopathy no breast tenderness or nipple discharge. (exam performed in the presence of a chaperone) ? ?LABORATORY  DATA:  ?I have reviewed the data as listed ?CMP Latest Ref Rng & Units 05/17/2021 07/01/2020 05/28/2018  ?Glucose 70 - 99 mg/dL 98 94 122(H)  ?BUN 6 - 24 mg/dL '15 12 17  '$ ?Creatinine 0.57 - 1.00 mg/dL 0.89 0.86 0.93  ?Sodium 134 - 144 mmol/L 143 138 137  ?Potassium 3.5 - 5.2 mmol/L 4.7 4.3 3.9  ?Chloride 96 - 106 mmol/L 104 99 100  ?CO2 20 - 29 mmol/L '27 25 28  '$ ?Calcium 8.7 - 10.2 mg/dL 9.6 9.8 9.1  ?Total Protein 6.0 - 8.5 g/dL 6.8 7.6 8.1  ?Total Bilirubin 0.0 - 1.2 mg/dL 0.3 0.4 0.7  ?Alkaline Phos 44 - 121 IU/L 131(H) 125(H) 92  ?AST 0 - 40 IU/L '20 18 22  '$ ?ALT 0 - 32 IU/L '15 15 19  '$ ? ? ?Lab Results  ?Component Value Date  ? WBC 4.8 05/17/2021  ? HGB 12.8 05/17/2021  ? HCT 39.7  05/17/2021  ? MCV 81 05/17/2021  ? PLT 201 05/17/2021  ? NEUTROABS 2.7 05/17/2021  ? ? ?ASSESSMENT & PLAN:  ?Carcinoma of upper-inner quadrant of right female breast ([place]) ?Rt Breast DCIS S/P Lumpectomy 2013 foll by XRT and now on tamoxifen since Feb 2014 ?  ?Tamoxifen Toxicities: ?Tolerating extremely well. ? ?It appears that she is tolerating the tamoxifen fairly well since she started exercising and eating better. ?Because of this we will continue tamoxifen until 2025 ?  ?Breast Cancer Surveillance: ?Mammogram  02/06/2021 no abnormalities. Postsurgical changes. Breast Density Category B. ?Breast exam 06/22/2021: Benign ?  ?RTC in 1 year ? ? ? ?No orders of the defined types were placed in this encounter. ? ?The patient has a good understanding of the overall plan. she agrees with it. she will call with any problems that may develop before the next visit here. ? ?Total time spent: 20 mins including face to face time and time spent for planning, charting and coordination of care ? ?Rulon Eisenmenger, MD, MPH ?06/22/2021 ? ?I, Thana Ates, am acting as scribe for Dr. Nicholas Lose. ? ?I have reviewed the above documentation for accuracy and completeness, and I agree with the above. ? ? ? ? ? ? ?

## 2021-06-22 ENCOUNTER — Inpatient Hospital Stay: Payer: BC Managed Care – PPO | Attending: Hematology and Oncology | Admitting: Hematology and Oncology

## 2021-06-22 ENCOUNTER — Other Ambulatory Visit: Payer: Self-pay

## 2021-06-22 DIAGNOSIS — C50211 Malignant neoplasm of upper-inner quadrant of right female breast: Secondary | ICD-10-CM

## 2021-06-22 DIAGNOSIS — Z79899 Other long term (current) drug therapy: Secondary | ICD-10-CM | POA: Insufficient documentation

## 2021-06-22 DIAGNOSIS — D0511 Intraductal carcinoma in situ of right breast: Secondary | ICD-10-CM | POA: Diagnosis not present

## 2021-06-22 DIAGNOSIS — Z923 Personal history of irradiation: Secondary | ICD-10-CM | POA: Diagnosis not present

## 2021-06-22 DIAGNOSIS — Z170 Estrogen receptor positive status [ER+]: Secondary | ICD-10-CM

## 2021-06-22 MED ORDER — TAMOXIFEN CITRATE 20 MG PO TABS: 20.0000 mg | ORAL_TABLET | Freq: Every day | ORAL | 3 refills | Status: DC

## 2021-06-22 NOTE — Assessment & Plan Note (Signed)
Rt Breast DCIS S/P Lumpectomy 2013 foll by XRT and now on tamoxifen since Feb 2014 ?? ?Tamoxifen Toxicities: ?1. Denies any hot flashes or myalgias ?Patient has not taken tamoxifen for 2 years in the middle. ?It appears that she is tolerating the tamoxifen fairly well since she started exercising and eating better. ?Because of this we will continue tamoxifen for 2 more years. ?? ?Breast Cancer Surveillance: ?Mammogram  02/06/2021 no abnormalities. Postsurgical changes. Breast Density Category B. ?Breast exam 06/22/2021: Benign ?? ?Feb 2020?emergency room visit for syncope: CT head normal ?? ?RTC in 1 year ?

## 2021-08-31 ENCOUNTER — Telehealth: Payer: Self-pay | Admitting: Family Medicine

## 2021-08-31 DIAGNOSIS — E8881 Metabolic syndrome: Secondary | ICD-10-CM

## 2021-08-31 NOTE — Telephone Encounter (Signed)
?  Prescription Request ? ?08/31/2021 ? ?Is this a "Controlled Substance" medicine? Semaglutide, 1 MG/DOSE, (OZEMPIC, 1 MG/DOSE,) 4 MG/3ML SOPN ? ?Pt wants to know if she can go up in dosage or continue with her dosage at '1MG'$ . Pt feels like she is losing some weight but not as much as she should. She is taking the shot every other week to make it last till next apt. Please call back ? ?Have you seen your PCP in the last 2 weeks? No. Pt jas apt in July ? ?If YES, route message to pool  -  If NO, patient needs to be scheduled for appointment. ? ?What is the name of the medication or equipment? Semaglutide, 1 MG/DOSE, (OZEMPIC, 1 MG/DOSE,) 4 MG/3ML SOPN ? ?Have you contacted your pharmacy to request a refill? no ? ?Which pharmacy would you like this sent to? [facility] ? ? ?Patient notified that their request is being sent to the clinical staff for review and that they should receive a response within 2 business days.  ? ? ?

## 2021-09-01 NOTE — Telephone Encounter (Signed)
We can discuss at her next appointment, if she wants a sooner appointment to discuss sooner than have her make 1 sooner. ?

## 2021-09-01 NOTE — Telephone Encounter (Signed)
Pt has been made aware of Dr. Merita Norton recommendations. She is unable to make a sooner appt due to exams/graduations. Pt would like refills of current dose. ?

## 2021-09-02 MED ORDER — OZEMPIC (1 MG/DOSE) 4 MG/3ML ~~LOC~~ SOPN: 1.0000 mg | PEN_INJECTOR | SUBCUTANEOUS | 1 refills | Status: DC

## 2021-09-02 NOTE — Telephone Encounter (Signed)
Patient informed. 

## 2021-09-02 NOTE — Telephone Encounter (Signed)
Refilled ozempic

## 2021-11-15 ENCOUNTER — Ambulatory Visit: Payer: BC Managed Care – PPO | Admitting: Family Medicine

## 2021-11-15 ENCOUNTER — Encounter: Payer: Self-pay | Admitting: Family Medicine

## 2021-11-15 VITALS — BP 133/78 | HR 76 | Temp 98.0°F | Ht 69.0 in | Wt 326.0 lb

## 2021-11-15 DIAGNOSIS — Z1211 Encounter for screening for malignant neoplasm of colon: Secondary | ICD-10-CM | POA: Diagnosis not present

## 2021-11-15 DIAGNOSIS — I10 Essential (primary) hypertension: Secondary | ICD-10-CM

## 2021-11-15 DIAGNOSIS — E782 Mixed hyperlipidemia: Secondary | ICD-10-CM | POA: Diagnosis not present

## 2021-11-15 DIAGNOSIS — J4530 Mild persistent asthma, uncomplicated: Secondary | ICD-10-CM | POA: Diagnosis not present

## 2021-11-15 DIAGNOSIS — E8881 Metabolic syndrome: Secondary | ICD-10-CM

## 2021-11-15 DIAGNOSIS — Z91030 Bee allergy status: Secondary | ICD-10-CM

## 2021-11-15 LAB — CMP14+EGFR
ALT: 12 IU/L (ref 0–32)
AST: 17 IU/L (ref 0–40)
Albumin/Globulin Ratio: 1.5 (ref 1.2–2.2)
Albumin: 4.1 g/dL (ref 3.8–4.9)
Alkaline Phosphatase: 112 IU/L (ref 44–121)
BUN/Creatinine Ratio: 15 (ref 12–28)
BUN: 15 mg/dL (ref 8–27)
Bilirubin Total: 0.5 mg/dL (ref 0.0–1.2)
CO2: 24 mmol/L (ref 20–29)
Calcium: 9.6 mg/dL (ref 8.7–10.3)
Chloride: 103 mmol/L (ref 96–106)
Creatinine, Ser: 0.98 mg/dL (ref 0.57–1.00)
Globulin, Total: 2.8 g/dL (ref 1.5–4.5)
Glucose: 82 mg/dL (ref 70–99)
Potassium: 4.7 mmol/L (ref 3.5–5.2)
Sodium: 141 mmol/L (ref 134–144)
Total Protein: 6.9 g/dL (ref 6.0–8.5)
eGFR: 66 mL/min/{1.73_m2} (ref >59)

## 2021-11-15 LAB — CBC WITH DIFFERENTIAL/PLATELET
Basophils Absolute: 0 10*3/uL (ref 0.0–0.2)
Basos: 0 %
EOS (ABSOLUTE): 0.1 10*3/uL (ref 0.0–0.4)
Eos: 2 %
Hematocrit: 41.2 % (ref 34.0–46.6)
Hemoglobin: 12.8 g/dL (ref 11.1–15.9)
Immature Grans (Abs): 0 10*3/uL (ref 0.0–0.1)
Immature Granulocytes: 0 %
Lymphocytes Absolute: 1.4 10*3/uL (ref 0.7–3.1)
Lymphs: 30 %
MCH: 25.5 pg — ABNORMAL LOW (ref 26.6–33.0)
MCHC: 31.1 g/dL — ABNORMAL LOW (ref 31.5–35.7)
MCV: 82 fL (ref 79–97)
Monocytes Absolute: 0.3 10*3/uL (ref 0.1–0.9)
Monocytes: 6 %
Neutrophils Absolute: 2.8 10*3/uL (ref 1.4–7.0)
Neutrophils: 62 %
Platelets: 199 10*3/uL (ref 150–450)
RBC: 5.02 x10E6/uL (ref 3.77–5.28)
RDW: 13.6 % (ref 11.7–15.4)
WBC: 4.6 10*3/uL (ref 3.4–10.8)

## 2021-11-15 LAB — LIPID PANEL
Chol/HDL Ratio: 4.4 ratio (ref 0.0–4.4)
Cholesterol, Total: 232 mg/dL — ABNORMAL HIGH (ref 100–199)
HDL: 53 mg/dL (ref >39)
LDL Chol Calc (NIH): 163 mg/dL — ABNORMAL HIGH (ref 0–99)
Triglycerides: 91 mg/dL (ref 0–149)
VLDL Cholesterol Cal: 16 mg/dL (ref 5–40)

## 2021-11-15 LAB — BAYER DCA HB A1C WAIVED: HB A1C (BAYER DCA - WAIVED): 5.2 % (ref 4.8–5.6)

## 2021-11-15 MED ORDER — EPINEPHRINE 0.3 MG/0.3ML IJ SOAJ: 0.3000 mg | INTRAMUSCULAR | 2 refills | Status: AC | PRN

## 2021-11-15 MED ORDER — ALBUTEROL SULFATE HFA 108 (90 BASE) MCG/ACT IN AERS
INHALATION_SPRAY | RESPIRATORY_TRACT | 3 refills | Status: DC
Start: 2021-11-15 — End: 2024-02-15

## 2021-11-15 MED ORDER — SEMAGLUTIDE (2 MG/DOSE) 8 MG/3ML ~~LOC~~ SOPN: 2.0000 mg | PEN_INJECTOR | SUBCUTANEOUS | 2 refills | Status: DC

## 2021-11-15 NOTE — Progress Notes (Signed)
BP 133/78   Pulse 76   Temp 98 F (36.7 C)   Ht _0  (1.753 m)   Wt 231 lb (104.8 kg)   LMP 12/01/2011   SpO2 99%   BMI 34.11 kg/m    Subjective:   Patient ID: Kayla Neal, female    DOB: Jul 28, 1960, 61 y.o.   MRN: [id number]  HPI: Kayla Neal is a 61 y.o. female presenting on 11/15/2021 for Medical Management of Chronic Issues, Hypertension, Hyperlipidemia (obe), and Obesity   HPI Patient has morbid obesity and metabolic syndrome and has been on Ozempic 1 mg and will increase to 2 mg.  She says it is helping and has been covered and denies any major side effects.  She is working on changing her diet and seeing a nutritionist.  Hypertension Patient is currently on valsartan, and their blood pressure today is 133/70. Patient denies any lightheadedness or dizziness. Patient denies headaches, blurred vision, chest pains, shortness of breath, or weakness. Denies any side effects from medication and is content with current medication.   Hyperlipidemia Patient is coming in for recheck of his hyperlipidemia. The patient is currently taking Crestor. They deny any issues with myalgias or history of liver damage from it. They deny any focal numbness or weakness or chest pain.   Relevant past medical, surgical, family and social history reviewed and updated as indicated. Interim medical history since our last visit reviewed. Allergies and medications reviewed and updated.  Review of Systems  Constitutional:  Negative for chills and fever.  Eyes:  Negative for visual disturbance.  Respiratory:  Negative for chest tightness and shortness of breath.   Cardiovascular:  Negative for chest pain and leg swelling.  Musculoskeletal:  Negative for back pain and gait problem.  Skin:  Negative for rash.  Neurological:  Negative for dizziness, light-headedness and headaches.  Psychiatric/Behavioral:  Negative for agitation and behavioral problems.   All other systems reviewed and  are negative.   Per HPI unless specifically indicated above   Allergies as of 11/15/2021       Reactions   Lisinopril Nausea And Vomiting, Other (See Comments)   Severe stomach cramps   Bee Pollen Hives   Has Hives with Bee Stings   Oxycodone Other (See Comments)   Patient prefers not to be on this med.  It makes her very loopy and unable to function.        Medication List        Accurate as of November 15, 2021  8:31 AM. If you have any questions, ask your nurse or doctor.          STOP taking these medications    Ozempic (1 MG/DOSE) 4 MG/3ML Sopn Generic drug: Semaglutide (1 MG/DOSE) Replaced by: Semaglutide (2 MG/DOSE) 8 MG/3ML Sopn Stopped by: Fransisca Kaufmann Tyse Auriemma, MD       TAKE these medications    albuterol 108 (90 Base) MCG/ACT inhaler Commonly known as: VENTOLIN HFA INHALE 1 PUFF INTO LUNGS ONCE DAILY AS NEEDED FOR ASTHMA   EPINEPHrine 0.3 mg/0.3 mL Soaj injection Commonly known as: EPI-PEN Inject 0.3 mg into the muscle as needed for anaphylaxis. Place on hold   EVENING PRIMROSE OIL PO Take by mouth daily as needed.   fluticasone 50 MCG/ACT nasal spray Commonly known as: FLONASE Place 2 sprays into both nostrils daily as needed for allergies or rhinitis. Place on hold   levocetirizine 5 MG tablet Commonly known as: XYZAL Take 1 tablet (5 mg  total) by mouth every evening.   Menthol (Topical Analgesic) 4 % Gel APPLY TO AFFECTED AREA UP TO 4 TIMES DAILY   montelukast 10 MG tablet Commonly known as: SINGULAIR Take 1 tablet (10 mg total) by mouth at bedtime.   naproxen sodium 220 MG tablet Commonly known as: ALEVE Take 1 tablet (220 mg total) by mouth 2 (two) times daily as needed.   omeprazole 20 MG capsule Commonly known as: PRILOSEC Take 1 capsule (20 mg total) by mouth daily. Place on hold   rosuvastatin 10 MG tablet Commonly known as: Crestor Take 1 tablet (10 mg total) by mouth daily.   Semaglutide (2 MG/DOSE) 8 MG/3ML Sopn Inject 2  mg as directed once a week. Replaces: Ozempic (1 MG/DOSE) 4 MG/3ML Sopn Started by: Fransisca Kaufmann Kailynne Ferrington, MD   Spacer/Aero Chamber Mouthpiece Misc 1 each by Does not apply route every 6 (six) hours as needed.   tamoxifen 20 MG tablet Commonly known as: NOLVADEX Take 1 tablet (20 mg total) by mouth daily.   valsartan 160 MG tablet Commonly known as: DIOVAN Take 1 tablet (160 mg total) by mouth daily. Place on hold         Objective:   BP 133/78   Pulse 76   Temp 98 F (36.7 C)   Ht _0  (1.753 m)   Wt 231 lb (104.8 kg)   LMP 12/01/2011   SpO2 99%   BMI 34.11 kg/m   Wt Readings from Last 3 Encounters:  11/15/21 231 lb (104.8 kg)  06/22/21 (!) 327 lb 4.8 oz (148.5 kg)  05/17/21 (!) 329 lb (149.2 kg)    Physical Exam Vitals and nursing note reviewed.  Constitutional:      General: She is not in acute distress.    Appearance: She is well-developed. She is not diaphoretic.  Eyes:     Conjunctiva/sclera: Conjunctivae normal.  Cardiovascular:     Rate and Rhythm: Normal rate and regular rhythm.     Heart sounds: Normal heart sounds. No murmur heard. Pulmonary:     Effort: Pulmonary effort is normal. No respiratory distress.     Breath sounds: Normal breath sounds. No wheezing.  Musculoskeletal:        General: No swelling or tenderness. Normal range of motion.  Skin:    General: Skin is warm and dry.     Findings: No rash.  Neurological:     Mental Status: She is alert and oriented to person, place, and time.     Coordination: Coordination normal.  Psychiatric:        Behavior: Behavior normal.       Assessment & Plan:   Problem List Items Addressed This Visit       Cardiovascular and Mediastinum   Essential hypertension - Primary   Relevant Medications   EPINEPHrine 0.3 mg/0.3 mL IJ SOAJ injection   Other Relevant Orders   CBC with Differential/Platelet   CMP14+EGFR   Lipid panel   CBC with Differential/Platelet   CMP14+EGFR   Lipid panel    Bayer DCA Hb A1c Waived     Respiratory   Asthma   Relevant Medications   albuterol (VENTOLIN HFA) 108 (90 Base) MCG/ACT inhaler     Other   Hyperlipidemia   Relevant Medications   EPINEPHrine 0.3 mg/0.3 mL IJ SOAJ injection   Other Relevant Orders   CBC with Differential/Platelet   CMP14+EGFR   Lipid panel   CBC with Differential/Platelet   CMP14+EGFR   Lipid panel  Bayer DCA Hb A1c Waived   Other Visit Diagnoses     Colon cancer screening       Relevant Orders   Ambulatory referral to Gastroenterology   Morbid obesity ([place])       Relevant Medications   Semaglutide, 2 MG/DOSE, 8 MG/3ML SOPN   Other Relevant Orders   CBC with Differential/Platelet   CMP14+EGFR   Lipid panel   Bayer DCA Hb Q1S Waived   Metabolic syndrome       Relevant Medications   Semaglutide, 2 MG/DOSE, 8 MG/3ML SOPN   Other Relevant Orders   CBC with Differential/Platelet   CMP14+EGFR   Lipid panel   Bayer DCA Hb A1c Waived   Bee sting allergy       Relevant Medications   EPINEPHrine 0.3 mg/0.3 mL IJ SOAJ injection       Will increase Ozempic for metabolic syndrome and weight.  She seems to be going in the right direction.  She is seeing a nutritionist and is trying to increase activity and exercise.  We will do blood work today.  No other changes in medicine Follow up plan: Return in about 6 months (around 05/18/2022), or if symptoms worsen or fail to improve, for Morbid obesity and weight recheck.  Counseling provided for all of the vaccine components Orders Placed This Encounter  Procedures   CBC with Differential/Platelet   CMP14+EGFR   Lipid panel   CBC with Differential/Platelet   CMP14+EGFR   Lipid panel   Bayer DCA Hb A1c Waived   Ambulatory referral to Gastroenterology    Caryl Pina, MD [facility] 11/15/2021, 8:31 AM

## 2021-11-30 ENCOUNTER — Telehealth: Payer: Self-pay | Admitting: Family Medicine

## 2021-11-30 ENCOUNTER — Other Ambulatory Visit: Payer: Self-pay

## 2021-11-30 MED ORDER — ROSUVASTATIN CALCIUM 20 MG PO TABS: 20.0000 mg | ORAL_TABLET | Freq: Every day | ORAL | 1 refills | Status: AC

## 2021-12-03 NOTE — Telephone Encounter (Signed)
Kayla Neal (Key: [id number]) Ozempic (2 MG/DOSE) '8MG'$ /3ML pen-injectors  Sent to plan  Original prior authorization sent over by pharmacy had incorrect information.

## 2021-12-03 NOTE — Telephone Encounter (Signed)
Prior authorization DENIED.  Denial reason:  "Your plan only covers this drug when it is used for certain health conditions. Covered use is for type 2 diabetes mellitus. Your plan does not cover the drug for your health condition that your doctor told us you have."

## 2021-12-06 NOTE — Telephone Encounter (Signed)
Left message for pt to return call.

## 2021-12-06 NOTE — Telephone Encounter (Signed)
Please let her know that these medicines were denied and I do not know if her insurance will cover Wegovy or Saxenda but she should call them and ask

## 2021-12-15 ENCOUNTER — Telehealth: Payer: Self-pay | Admitting: Family Medicine

## 2021-12-15 DIAGNOSIS — E8881 Metabolic syndrome: Secondary | ICD-10-CM

## 2021-12-15 MED ORDER — TRULICITY 0.75 MG/0.5ML ~~LOC~~ SOAJ: 0.7500 mg | SUBCUTANEOUS | 1 refills | Status: DC

## 2021-12-15 NOTE — Telephone Encounter (Signed)
I sent Trulicity for her but I do not know that it will be covered but she can try it and let us know.

## 2021-12-15 NOTE — Telephone Encounter (Signed)
Pt has been informed. She has no concerns.

## 2022-01-20 ENCOUNTER — Other Ambulatory Visit: Payer: Self-pay | Admitting: Hematology and Oncology

## 2022-01-20 DIAGNOSIS — Z1231 Encounter for screening mammogram for malignant neoplasm of breast: Secondary | ICD-10-CM

## 2022-02-15 ENCOUNTER — Other Ambulatory Visit: Payer: Self-pay | Admitting: Family Medicine

## 2022-02-15 DIAGNOSIS — E8881 Metabolic syndrome: Secondary | ICD-10-CM

## 2022-02-16 ENCOUNTER — Ambulatory Visit
Admission: RE | Admit: 2022-02-16 | Discharge: 2022-02-16 | Disposition: A | Discharge status: Home / Self Care | Payer: BC Managed Care – PPO | Source: Ambulatory Visit | Attending: Hematology and Oncology | Admitting: Hematology and Oncology

## 2022-02-16 DIAGNOSIS — Z1231 Encounter for screening mammogram for malignant neoplasm of breast: Secondary | ICD-10-CM

## 2022-03-14 ENCOUNTER — Other Ambulatory Visit: Payer: Self-pay | Admitting: Hematology and Oncology

## 2022-03-14 DIAGNOSIS — C50211 Malignant neoplasm of upper-inner quadrant of right female breast: Secondary | ICD-10-CM

## 2022-05-15 ENCOUNTER — Other Ambulatory Visit: Payer: Self-pay | Admitting: Family Medicine

## 2022-05-15 DIAGNOSIS — E8881 Metabolic syndrome: Secondary | ICD-10-CM

## 2022-05-17 ENCOUNTER — Telehealth: Payer: Self-pay

## 2022-05-17 DIAGNOSIS — E8881 Metabolic syndrome: Secondary | ICD-10-CM

## 2022-05-17 NOTE — Telephone Encounter (Signed)
Pts insurance will no longer cover Trulicity  They will approve:  Byetta '10mg'$  pen  Farxiga  Jardiance  Lantus Solostar  Metformin  Ozempic  Victoza

## 2022-05-18 ENCOUNTER — Encounter: Payer: Self-pay | Admitting: Family Medicine

## 2022-05-18 ENCOUNTER — Ambulatory Visit: Payer: BC Managed Care – PPO | Admitting: Family Medicine

## 2022-05-18 VITALS — BP 159/87 | HR 67 | Ht 69.0 in | Wt 326.0 lb

## 2022-05-18 DIAGNOSIS — K219 Gastro-esophageal reflux disease without esophagitis: Secondary | ICD-10-CM | POA: Diagnosis not present

## 2022-05-18 DIAGNOSIS — J302 Other seasonal allergic rhinitis: Secondary | ICD-10-CM

## 2022-05-18 DIAGNOSIS — I10 Essential (primary) hypertension: Secondary | ICD-10-CM

## 2022-05-18 DIAGNOSIS — J4530 Mild persistent asthma, uncomplicated: Secondary | ICD-10-CM | POA: Diagnosis not present

## 2022-05-18 DIAGNOSIS — E8881 Metabolic syndrome: Secondary | ICD-10-CM

## 2022-05-18 DIAGNOSIS — E782 Mixed hyperlipidemia: Secondary | ICD-10-CM

## 2022-05-18 LAB — CBC WITH DIFFERENTIAL/PLATELET
Basophils Absolute: 0 10*3/uL (ref 0.0–0.2)
Basos: 0 %
EOS (ABSOLUTE): 0.1 10*3/uL (ref 0.0–0.4)
Eos: 1 %
Hematocrit: 40.8 % (ref 34.0–46.6)
Hemoglobin: 13.1 g/dL (ref 11.1–15.9)
Immature Grans (Abs): 0 10*3/uL (ref 0.0–0.1)
Immature Granulocytes: 0 %
Lymphocytes Absolute: 1.3 10*3/uL (ref 0.7–3.1)
Lymphs: 29 %
MCH: 26.1 pg — ABNORMAL LOW (ref 26.6–33.0)
MCHC: 32.1 g/dL (ref 31.5–35.7)
MCV: 81 fL (ref 79–97)
Monocytes Absolute: 0.3 10*3/uL (ref 0.1–0.9)
Monocytes: 7 %
Neutrophils Absolute: 2.9 10*3/uL (ref 1.4–7.0)
Neutrophils: 63 %
Platelets: 192 10*3/uL (ref 150–450)
RBC: 5.01 x10E6/uL (ref 3.77–5.28)
RDW: 13.2 % (ref 11.7–15.4)
WBC: 4.6 10*3/uL (ref 3.4–10.8)

## 2022-05-18 LAB — CMP14+EGFR
ALT: 17 IU/L (ref 0–32)
AST: 23 IU/L (ref 0–40)
Albumin/Globulin Ratio: 1.7 (ref 1.2–2.2)
Albumin: 4.4 g/dL (ref 3.8–4.9)
Alkaline Phosphatase: 116 IU/L (ref 44–121)
BUN/Creatinine Ratio: 15 (ref 12–28)
BUN: 15 mg/dL (ref 8–27)
Bilirubin Total: 0.7 mg/dL (ref 0.0–1.2)
CO2: 22 mmol/L (ref 20–29)
Calcium: 9.6 mg/dL (ref 8.7–10.3)
Chloride: 101 mmol/L (ref 96–106)
Creatinine, Ser: 1 mg/dL (ref 0.57–1.00)
Globulin, Total: 2.6 g/dL (ref 1.5–4.5)
Glucose: 89 mg/dL (ref 70–99)
Potassium: 4.7 mmol/L (ref 3.5–5.2)
Sodium: 140 mmol/L (ref 134–144)
Total Protein: 7 g/dL (ref 6.0–8.5)
eGFR: 64 mL/min/{1.73_m2} (ref >59)

## 2022-05-18 LAB — LIPID PANEL
Chol/HDL Ratio: 3.6 ratio (ref 0.0–4.4)
Cholesterol, Total: 243 mg/dL — ABNORMAL HIGH (ref 100–199)
HDL: 67 mg/dL (ref >39)
LDL Chol Calc (NIH): 164 mg/dL — ABNORMAL HIGH (ref 0–99)
Triglycerides: 72 mg/dL (ref 0–149)
VLDL Cholesterol Cal: 12 mg/dL (ref 5–40)

## 2022-05-18 LAB — BAYER DCA HB A1C WAIVED: HB A1C (BAYER DCA - WAIVED): 5.4 % (ref 4.8–5.6)

## 2022-05-18 MED ORDER — VALSARTAN 160 MG PO TABS: 160.0000 mg | ORAL_TABLET | Freq: Every day | ORAL | 3 refills | Status: DC

## 2022-05-18 MED ORDER — FLUTICASONE PROPIONATE 50 MCG/ACT NA SUSP: 2.0000 | Freq: Every day | NASAL | 5 refills | Status: AC | PRN

## 2022-05-18 MED ORDER — TRULICITY 1.5 MG/0.5ML ~~LOC~~ SOAJ: 1.5000 mg | SUBCUTANEOUS | 3 refills | Status: DC

## 2022-05-18 MED ORDER — MONTELUKAST SODIUM 10 MG PO TABS: 10.0000 mg | ORAL_TABLET | Freq: Every day | ORAL | 3 refills | Status: DC

## 2022-05-18 MED ORDER — OMEPRAZOLE 20 MG PO CPDR: 20.0000 mg | DELAYED_RELEASE_CAPSULE | Freq: Every day | ORAL | 3 refills | Status: AC

## 2022-05-18 MED ORDER — LEVOCETIRIZINE DIHYDROCHLORIDE 5 MG PO TABS: 5.0000 mg | ORAL_TABLET | Freq: Every evening | ORAL | 3 refills | Status: DC

## 2022-05-18 MED ORDER — SEMAGLUTIDE(0.25 OR 0.5MG/DOS) 2 MG/3ML ~~LOC~~ SOPN: 0.5000 mg | PEN_INJECTOR | SUBCUTANEOUS | 5 refills | Status: DC

## 2022-05-18 NOTE — Progress Notes (Signed)
BP (!) 159/87   Pulse 67   Ht '5\' 9"'$  (1.753 m)   Wt (!) 326 lb (147.9 kg)   LMP 12/01/2011   SpO2 99%   BMI 48.14 kg/m    Subjective:   Patient ID: Kayla Neal, female    DOB: October 04, 1960, 62 y.o.   MRN: [id number]  HPI: Kayla Neal is a 62 y.o. female presenting on 05/18/2022 for Medical Management of Chronic Issues, Hyperlipidemia, and Hypertension   HPI Metabolic syndrome and morbid obesity Patient's dyslipidemia and hypertension and asthma are more complicated by the patient's morbid obesity.  Discussed weight loss and lifestyle modification and exercise with the patient.  She has been on Trulicity because Ozempic was not covered and she has been doing well and wants to know if she can go for the dose.  Hypertension Patient is currently on valsartan, and their blood pressure today is 159/87 but she says she checks it at least 2-3 times a week at home and it has always in the 130s and 120s over 70s.. Patient denies any lightheadedness or dizziness. Patient denies headaches, blurred vision, chest pains, shortness of breath, or weakness. Denies any side effects from medication and is content with current medication.   Hyperlipidemia Patient is coming in for recheck of his hyperlipidemia. The patient is currently taking Crestor. They deny any issues with myalgias or history of liver damage from it. They deny any focal numbness or weakness or chest pain.   Patient has chronic allergic rhinitis and asthma and currently takes albuterol and Singulair and Xyzal does well with them.  Relevant past medical, surgical, family and social history reviewed and updated as indicated. Interim medical history since our last visit reviewed. Allergies and medications reviewed and updated.  Review of Systems  Constitutional:  Negative for chills and fever.  HENT:  Negative for congestion, ear discharge and ear pain.   Eyes:  Negative for redness and visual disturbance.  Respiratory:   Negative for chest tightness and shortness of breath.   Cardiovascular:  Negative for chest pain and leg swelling.  Musculoskeletal:  Negative for back pain and gait problem.  Skin:  Negative for rash.  Neurological:  Negative for dizziness, light-headedness and headaches.  Psychiatric/Behavioral:  Negative for agitation and behavioral problems.   All other systems reviewed and are negative.   Per HPI unless specifically indicated above   Allergies as of 05/18/2022       Reactions   Lisinopril Nausea And Vomiting, Other (See Comments)   Severe stomach cramps   Bee Pollen Hives   Has Hives with Bee Stings   Oxycodone Other (See Comments)   Patient prefers not to be on this med.  It makes her very loopy and unable to function.        Medication List        Accurate as of May 18, 2022  9:04 AM. If you have any questions, ask your nurse or doctor.          STOP taking these medications    Trulicity 2.59 DG/3.8VF Sopn Generic drug: Dulaglutide Replaced by: Trulicity 1.5 IE/3.3IR Sopn Stopped by: Fransisca Kaufmann Donell Tomkins, MD       TAKE these medications    albuterol 108 (90 Base) MCG/ACT inhaler Commonly known as: VENTOLIN HFA INHALE 1 PUFF INTO LUNGS ONCE DAILY AS NEEDED FOR ASTHMA   EPINEPHrine 0.3 mg/0.3 mL Soaj injection Commonly known as: EPI-PEN Inject 0.3 mg into the muscle as needed for anaphylaxis.  Place on hold   EVENING PRIMROSE OIL PO Take by mouth daily as needed.   fluticasone 50 MCG/ACT nasal spray Commonly known as: FLONASE Place 2 sprays into both nostrils daily as needed for allergies or rhinitis. Place on hold   levocetirizine 5 MG tablet Commonly known as: XYZAL Take 1 tablet (5 mg total) by mouth every evening.   Menthol (Topical Analgesic) 4 % Gel APPLY TO AFFECTED AREA UP TO 4 TIMES DAILY   montelukast 10 MG tablet Commonly known as: SINGULAIR Take 1 tablet (10 mg total) by mouth at bedtime.   naproxen sodium 220 MG  tablet Commonly known as: ALEVE Take 1 tablet (220 mg total) by mouth 2 (two) times daily as needed.   omeprazole 20 MG capsule Commonly known as: PRILOSEC Take 1 capsule (20 mg total) by mouth daily. Place on hold   rosuvastatin 20 MG tablet Commonly known as: Crestor Take 1 tablet (20 mg total) by mouth daily.   Spacer/Aero Chamber Mouthpiece Misc 1 each by Does not apply route every 6 (six) hours as needed.   tamoxifen 20 MG tablet Commonly known as: NOLVADEX Take 1 tablet by mouth once daily   Trulicity 1.5 DT/1.4HO Sopn Generic drug: Dulaglutide Inject 1.5 mg into the skin once a week. Replaces: Trulicity 8.87 NZ/9.7KQ Sopn Started by: Fransisca Kaufmann Mercades Bajaj, MD   valsartan 160 MG tablet Commonly known as: DIOVAN Take 1 tablet (160 mg total) by mouth daily. Place on hold         Objective:   BP (!) 159/87   Pulse 67   Ht '5\' 9"'$  (1.753 m)   Wt (!) 326 lb (147.9 kg)   LMP 12/01/2011   SpO2 99%   BMI 48.14 kg/m   Wt Readings from Last 3 Encounters:  05/18/22 (!) 326 lb (147.9 kg)  11/15/21 (!) 326 lb (147.9 kg)  06/22/21 (!) 327 lb 4.8 oz (148.5 kg)    Physical Exam Vitals and nursing note reviewed.  Constitutional:      General: She is not in acute distress.    Appearance: She is well-developed. She is not diaphoretic.  Eyes:     Conjunctiva/sclera: Conjunctivae normal.  Cardiovascular:     Rate and Rhythm: Normal rate and regular rhythm.     Heart sounds: Normal heart sounds. No murmur heard. Pulmonary:     Effort: Pulmonary effort is normal. No respiratory distress.     Breath sounds: Normal breath sounds. No wheezing.  Musculoskeletal:        General: No swelling or tenderness. Normal range of motion.  Skin:    General: Skin is warm and dry.     Findings: No rash.  Neurological:     Mental Status: She is alert and oriented to person, place, and time.     Coordination: Coordination normal.  Psychiatric:        Behavior: Behavior normal.        Assessment & Plan:   Problem List Items Addressed This Visit       Cardiovascular and Mediastinum   Essential hypertension   Relevant Medications   valsartan (DIOVAN) 160 MG tablet   Dulaglutide (TRULICITY) 1.5 AS/6.0RV SOPN   Other Relevant Orders   CBC with Differential/Platelet   CMP14+EGFR   Lipid panel   Bayer DCA Hb A1c Waived     Respiratory   Asthma   Relevant Medications   montelukast (SINGULAIR) 10 MG tablet     Digestive   GERD (gastroesophageal reflux disease)  Relevant Medications   omeprazole (PRILOSEC) 20 MG capsule     Other   Hyperlipidemia - Primary   Relevant Medications   valsartan (DIOVAN) 160 MG tablet   Other Relevant Orders   CBC with Differential/Platelet   CMP14+EGFR   Lipid panel   Bayer DCA Hb A1c Waived   Obesities   Relevant Medications   Dulaglutide (TRULICITY) 1.5 QB/3.4LP SOPN   Other Visit Diagnoses     Morbid obesity ([place])       Relevant Medications   Dulaglutide (TRULICITY) 1.5 FX/9.0WI SOPN   Metabolic syndrome       Relevant Medications   Dulaglutide (TRULICITY) 1.5 OX/7.3ZH SOPN   Seasonal allergies       Relevant Medications   levocetirizine (XYZAL) 5 MG tablet       Will increase Trulicity but otherwise continue current medicine, will check blood work today. Follow up plan: Return in about 6 months (around 11/16/2022), or if symptoms worsen or fail to improve, for Physical exam and hypertension and GERD.  Counseling provided for all of the vaccine components Orders Placed This Encounter  Procedures   CBC with Differential/Platelet   CMP14+EGFR   Lipid panel   Bayer DCA Hb A1c [place], MD [facility] 05/18/2022, 9:04 AM

## 2022-05-18 NOTE — Telephone Encounter (Signed)
Patient states she talked to insurance today and they said they would send over a PA for higher dose of Trulicity.   Per patient Insurance states that we need to put down her A1c from 2020 and tell how its came down with Trulicity and weight loss.   When speaking to insurance they state Ozempic was not coverred.

## 2022-05-18 NOTE — Telephone Encounter (Signed)
Insurance that they would not cover Trulicity so we sent Ozempic

## 2022-05-19 MED ORDER — TRULICITY 1.5 MG/0.5ML ~~LOC~~ SOAJ: 1.5000 mg | SUBCUTANEOUS | 3 refills | Status: DC

## 2022-05-19 NOTE — Telephone Encounter (Signed)
Dettinger, what strength of Trulicity does pt need?

## 2022-05-19 NOTE — Telephone Encounter (Signed)
Trulicity 1.5 mg weekly

## 2022-05-19 NOTE — Addendum Note (Signed)
Addended by: Alphonzo Dublin on: 05/19/2022 01:11 PM   Modules accepted: Orders

## 2022-05-19 NOTE — Telephone Encounter (Signed)
Okay that sounds good to go and send it in, her A1c in 2020 was 5.7 and most recently was 5.4.  Her weight was about 19 pounds heavier last year so she has come down that much weight in the past year as well.  Go ahead and try for the Trulicity

## 2022-05-26 NOTE — Telephone Encounter (Signed)
PA PENDING

## 2022-05-26 NOTE — Telephone Encounter (Signed)
Pt calling for update on PA. Please call back

## 2022-05-27 ENCOUNTER — Telehealth: Payer: Self-pay | Admitting: *Deleted

## 2022-05-27 NOTE — Telephone Encounter (Signed)
Kayla Neal ([employer]) - [id number] Trulicity [id number] pen-injectors Status: PA Response - Denied  Requires diagnosis of Diabetes Mellitus

## 2022-05-27 NOTE — Telephone Encounter (Signed)
I told patient that this likely was not going to be covered anymore because she does not have diabetes where she received it before.  It looks like insurance will cover any more, there is grafting down more on this.  So just let her know

## 2022-06-15 ENCOUNTER — Encounter: Payer: Self-pay | Admitting: Adult Health

## 2022-06-15 ENCOUNTER — Other Ambulatory Visit (HOSPITAL_COMMUNITY): Payer: Self-pay

## 2022-06-20 ENCOUNTER — Telehealth: Payer: Self-pay | Admitting: Hematology and Oncology

## 2022-06-20 NOTE — Telephone Encounter (Signed)
Rescheduled appointment per provider on call. Left voicemail.

## 2022-06-23 ENCOUNTER — Inpatient Hospital Stay: Payer: BC Managed Care – PPO | Admitting: Hematology and Oncology

## 2022-07-06 ENCOUNTER — Telehealth: Payer: Self-pay | Admitting: Hematology and Oncology

## 2022-07-06 NOTE — Telephone Encounter (Signed)
Patient called to reschedule her appointment with Gudena.

## 2022-07-07 ENCOUNTER — Ambulatory Visit: Payer: Self-pay

## 2022-07-07 ENCOUNTER — Inpatient Hospital Stay: Payer: BC Managed Care – PPO | Admitting: Hematology and Oncology

## 2022-07-07 NOTE — Progress Notes (Signed)
Spoke with patient, at her last visit with Kayla Neal her blood pressure was elevated.  Contacted patient to see if she checks blood pressure at home and if not if she will schedule an appointment with triage nurse to have it checked.  Patient does check blood pressure at home regularly and last night it was 123/68.  Patient will continue to monitor and if she gets any abnormal readings she will contact Kayla Neal.

## 2022-08-05 IMAGING — MG MM DIGITAL SCREENING BILAT W/ TOMO AND CAD
6 of 10 series · 6 of 30 positions shown · non-contrast
Comparison: Previous exam(s).

CLINICAL DATA: Screening.

EXAM:
DIGITAL SCREENING BILATERAL MAMMOGRAM WITH TOMOSYNTHESIS AND CAD
TECHNIQUE: Bilateral screening digital craniocaudal and mediolateral oblique
mammograms were obtained. Bilateral screening digital breast
tomosynthesis was performed. The images were evaluated with
computer-aided detection.

[L MLO synth-2D]
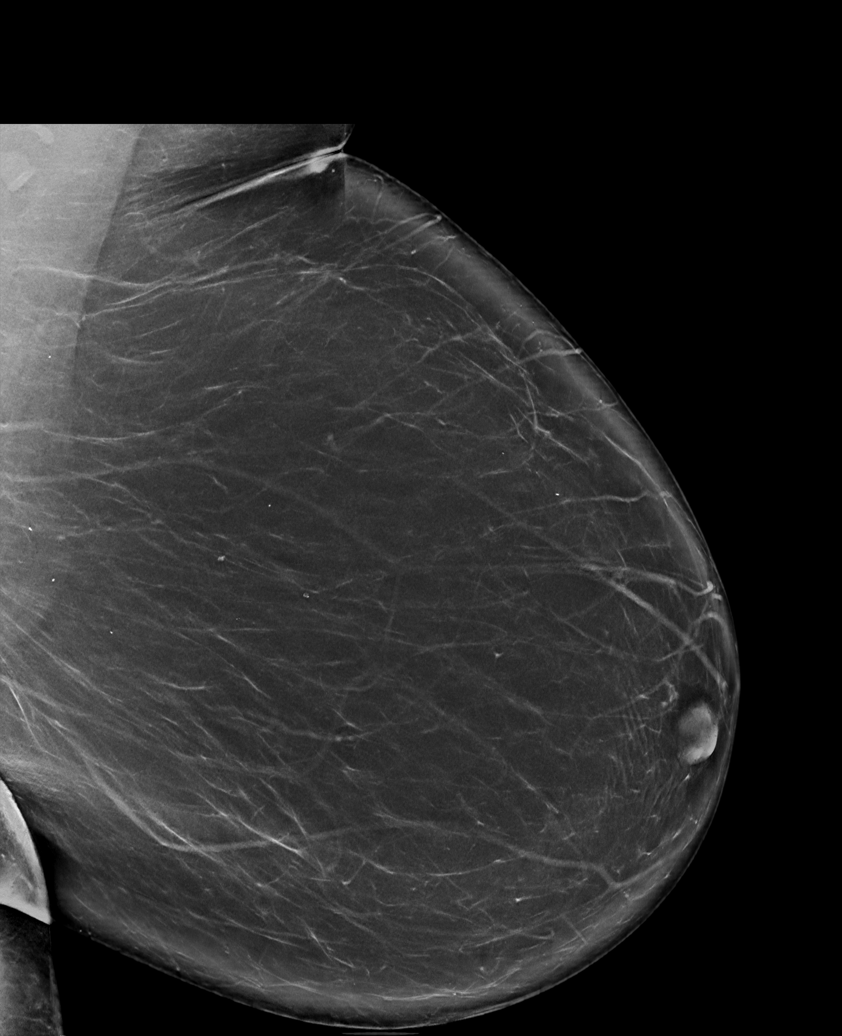

[L CC synth-2D]
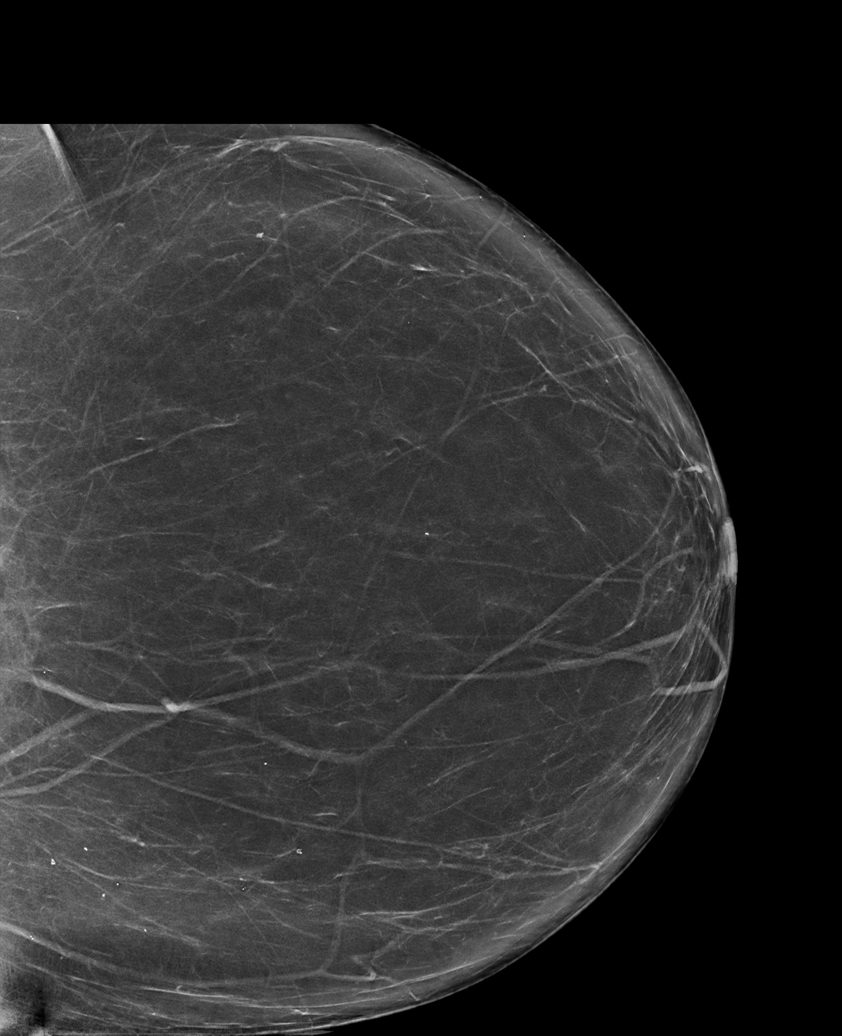

[R CC synth-2D]
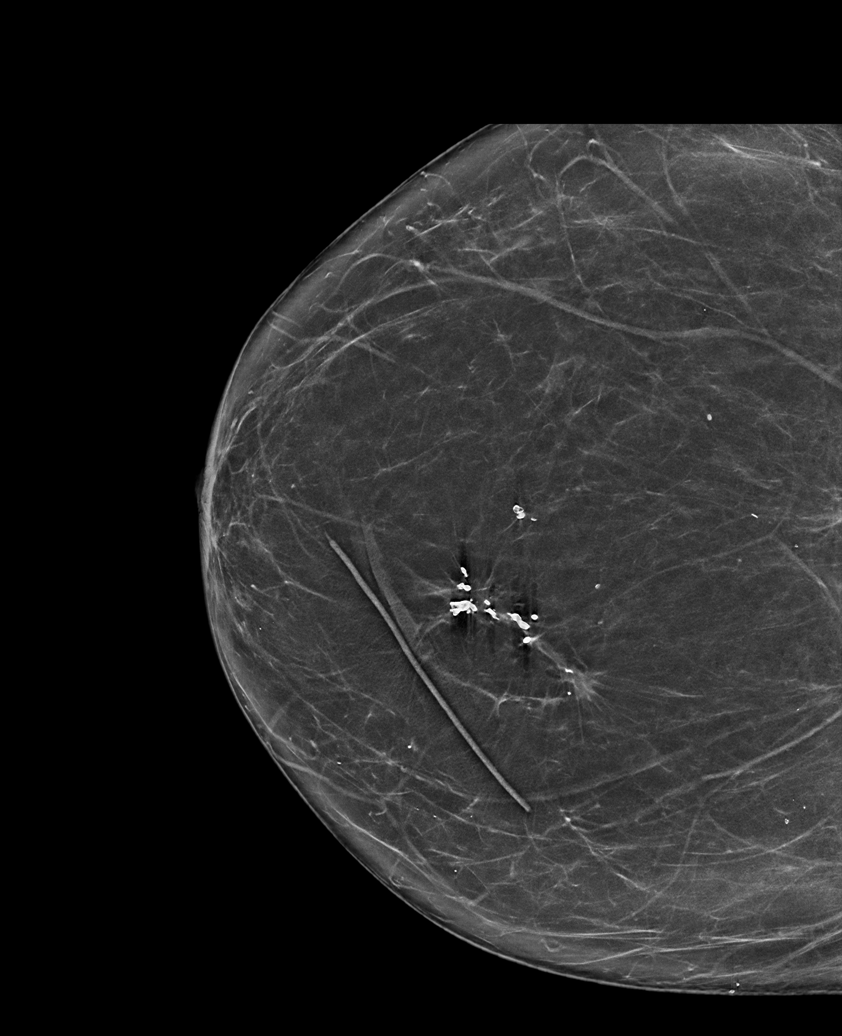

[R MLO synth-2D]
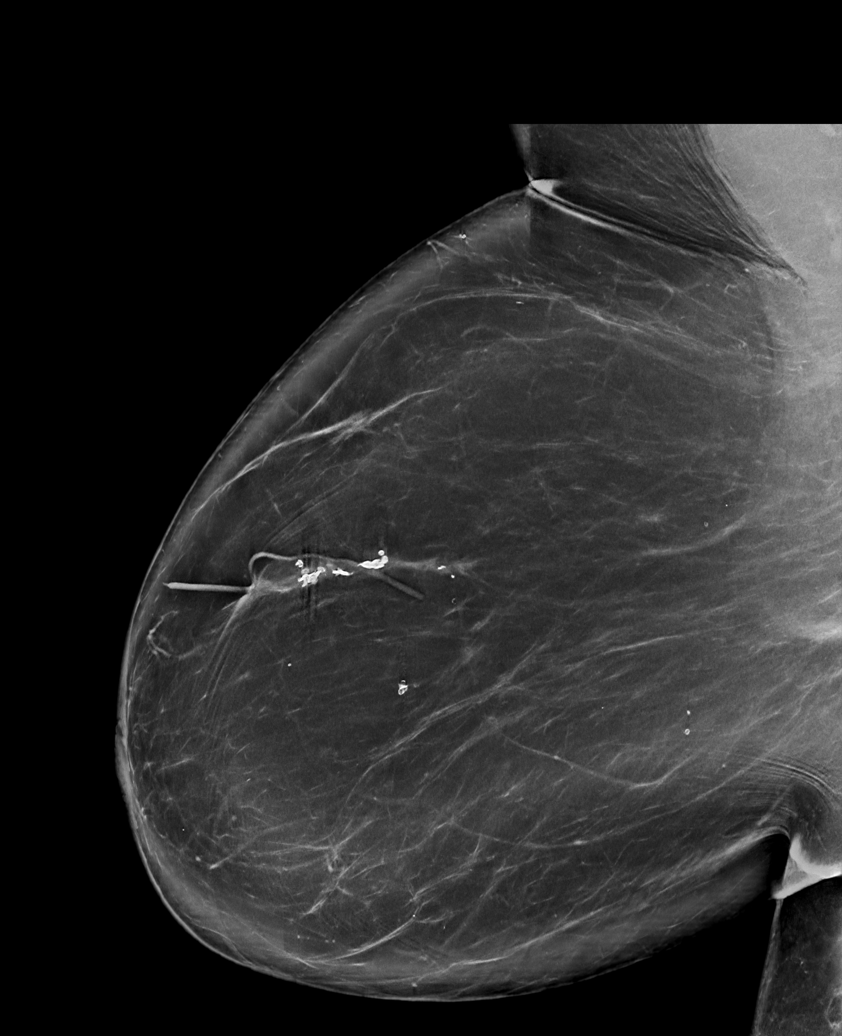

[L CV synth-2D]
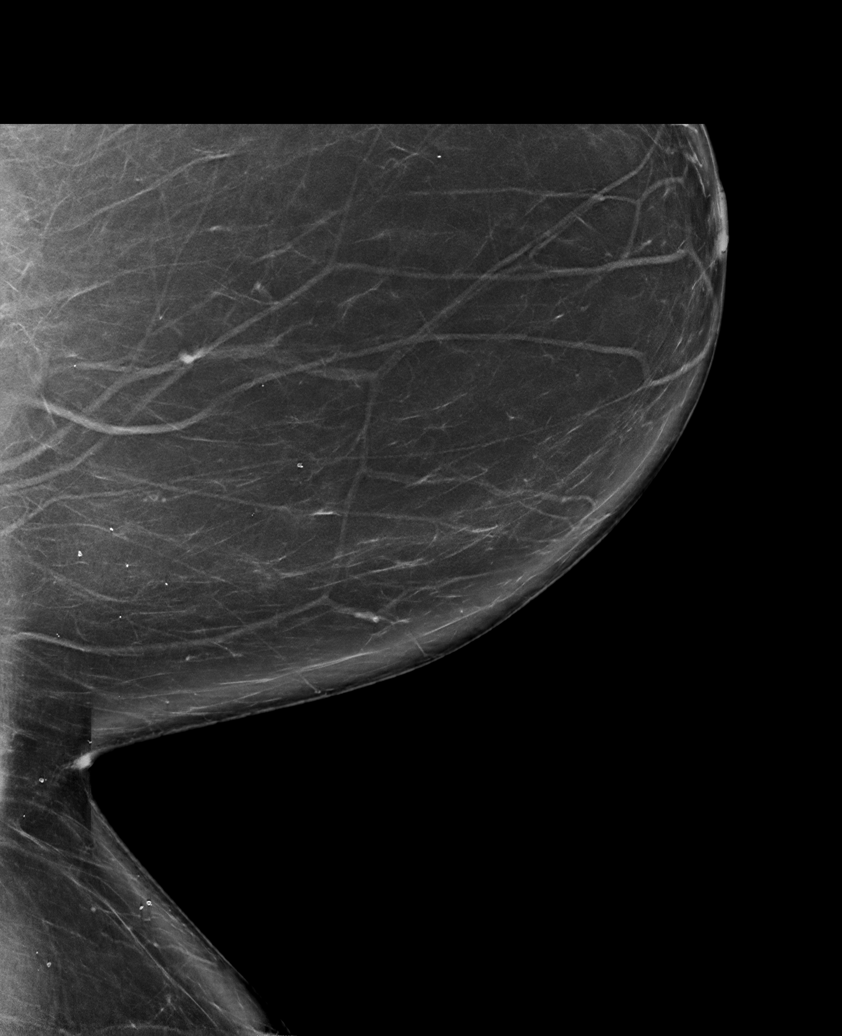

[L MLO tomo · tomo slice 53/104.0]
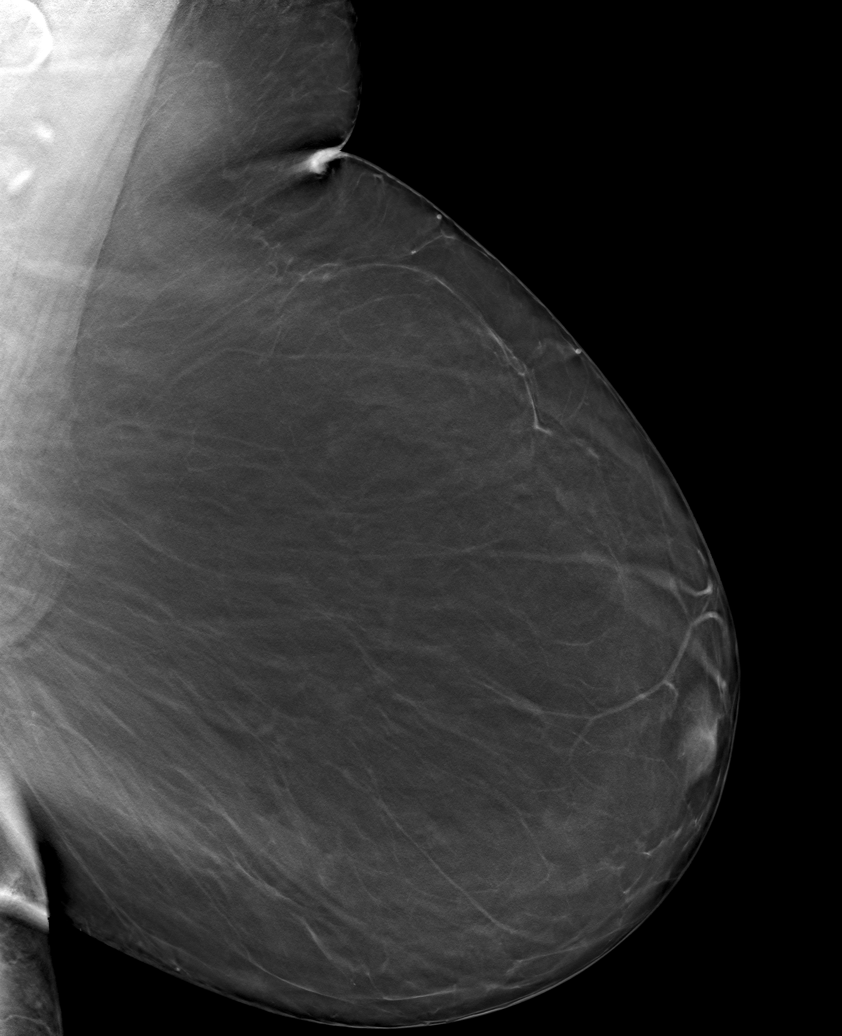

[6 of 30 positions shown; findings below may reference images not displayed]

ACR Breast Density Category b: There are scattered areas of
fibroglandular density.
FINDINGS: There are no findings suspicious for malignancy.
IMPRESSION: No mammographic evidence of malignancy. A result letter of this
screening mammogram will be mailed directly to the patient.

RECOMMENDATION:
Screening mammogram in one year. (Code:51-O-LD2)

BI-RADS CATEGORY  1: Negative.

## 2022-08-08 ENCOUNTER — Telehealth: Payer: Self-pay | Admitting: Hematology and Oncology

## 2022-08-08 NOTE — Telephone Encounter (Signed)
Patient left voicemail to cancel 4/23 appointment and will call back to reschedule at later date. Left patient voicemail that appointment has been cancelled and to call back with when she would like to be rescheduled.

## 2022-08-09 ENCOUNTER — Inpatient Hospital Stay: Payer: BC Managed Care – PPO | Admitting: Hematology and Oncology

## 2022-11-17 ENCOUNTER — Encounter: Payer: BC Managed Care – PPO | Admitting: Family Medicine

## 2022-12-20 ENCOUNTER — Other Ambulatory Visit: Payer: Self-pay | Admitting: Hematology and Oncology

## 2022-12-20 DIAGNOSIS — C50211 Malignant neoplasm of upper-inner quadrant of right female breast: Secondary | ICD-10-CM

## 2023-01-30 ENCOUNTER — Other Ambulatory Visit: Payer: Self-pay | Admitting: Hematology and Oncology

## 2023-01-30 DIAGNOSIS — Z1231 Encounter for screening mammogram for malignant neoplasm of breast: Secondary | ICD-10-CM

## 2023-02-24 ENCOUNTER — Ambulatory Visit
Admission: RE | Admit: 2023-02-24 | Discharge: 2023-02-24 | Disposition: A | Discharge status: Home / Self Care | Payer: BC Managed Care – PPO | Source: Ambulatory Visit | Attending: Hematology and Oncology

## 2023-02-24 DIAGNOSIS — Z1231 Encounter for screening mammogram for malignant neoplasm of breast: Secondary | ICD-10-CM

## 2023-03-01 ENCOUNTER — Telehealth: Payer: Self-pay | Admitting: *Deleted

## 2023-03-01 ENCOUNTER — Other Ambulatory Visit: Payer: Self-pay | Admitting: Hematology and Oncology

## 2023-03-01 ENCOUNTER — Telehealth: Payer: Self-pay | Admitting: Hematology and Oncology

## 2023-03-01 DIAGNOSIS — C50211 Malignant neoplasm of upper-inner quadrant of right female breast: Secondary | ICD-10-CM

## 2023-03-01 NOTE — Telephone Encounter (Signed)
Spoke with patient confirming upcoming appointment  

## 2023-03-01 NOTE — Telephone Encounter (Signed)
RN attempt x1 to contact pt regarding several missed appts and it has been over 1 year since pt was seen in office.  No answer, LVM and sent scheduling message.

## 2023-04-04 ENCOUNTER — Inpatient Hospital Stay: Payer: BC Managed Care – PPO | Attending: Hematology and Oncology | Admitting: Hematology and Oncology

## 2023-04-04 VITALS — BP 159/80 | HR 77 | Temp 98.2°F | Resp 18 | Ht 69.0 in | Wt 335.4 lb

## 2023-04-04 DIAGNOSIS — Z923 Personal history of irradiation: Secondary | ICD-10-CM | POA: Insufficient documentation

## 2023-04-04 DIAGNOSIS — Z170 Estrogen receptor positive status [ER+]: Secondary | ICD-10-CM | POA: Insufficient documentation

## 2023-04-04 DIAGNOSIS — C50211 Malignant neoplasm of upper-inner quadrant of right female breast: Secondary | ICD-10-CM | POA: Diagnosis not present

## 2023-04-04 DIAGNOSIS — Z79810 Long term (current) use of selective estrogen receptor modulators (SERMs): Secondary | ICD-10-CM | POA: Insufficient documentation

## 2023-04-04 MED ORDER — TAMOXIFEN CITRATE 20 MG PO TABS: 20.0000 mg | ORAL_TABLET | Freq: Every day | ORAL | 11 refills | Status: DC

## 2023-04-04 NOTE — Assessment & Plan Note (Signed)
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   It appears that she is tolerating the tamoxifen fairly well since she started exercising and eating better. Because of this we will continue tamoxifen until 2025   Breast Cancer Surveillance: Mammogram 02/28/2023 no abnormalities. Postsurgical changes. Breast Density Category B. Breast exam 04/04/2023: Benign   RTC in 1 year

## 2023-04-04 NOTE — Progress Notes (Signed)
Patient Care Team: Dettinger, Elige Radon, MD as PCP - General (Family Medicine)  DIAGNOSIS:  Encounter Diagnoses  Name Primary?   Carcinoma of upper-inner quadrant of right breast in female, estrogen receptor positive (HCC) Yes   Carcinoma of upper-inner quadrant of right female breast, unspecified estrogen receptor status (HCC)     SUMMARY OF ONCOLOGIC HISTORY: Oncology History  Carcinoma of upper-inner quadrant of right female breast (HCC)  11/14/2011 Surgery   Rt Lumpectomy: DCIS high grade 2.4 cm, Er 100%, PR 80%   02/13/2012 - 03/30/2012 Radiation Therapy   Adj XRT by Dr.Palermo   05/28/2012 -  Anti-estrogen oral therapy   Tamoxifen 20 mg daily x 5 yrs     CHIEF COMPLIANT:   HISTORY OF PRESENT ILLNESS: Discussed the use of AI scribe software for clinical note transcription with the patient, who gave verbal consent to proceed.  History of Present Illness             ALLERGIES:  is allergic to lisinopril, bee pollen, and oxycodone.  MEDICATIONS:  Current Outpatient Medications  Medication Sig Dispense Refill   albuterol (VENTOLIN HFA) 108 (90 Base) MCG/ACT inhaler INHALE 1 PUFF INTO LUNGS ONCE DAILY AS NEEDED FOR ASTHMA 8 g 3   Dulaglutide (TRULICITY) 1.5 MG/0.5ML SOPN Inject 1.5 mg into the skin once a week. 1 mL 3   EPINEPHrine 0.3 mg/0.3 mL IJ SOAJ injection Inject 0.3 mg into the muscle as needed for anaphylaxis. Place on hold 1 each 2   EVENING PRIMROSE OIL PO Take by mouth daily as needed.      fluticasone (FLONASE) 50 MCG/ACT nasal spray Place 2 sprays into both nostrils daily as needed for allergies or rhinitis. Place on hold 16 g 5   levocetirizine (XYZAL) 5 MG tablet Take 1 tablet (5 mg total) by mouth every evening. 90 tablet 3   Menthol, Topical Analgesic, 4 % GEL APPLY TO AFFECTED AREA UP TO 4 TIMES DAILY 74 mL 2   montelukast (SINGULAIR) 10 MG tablet Take 1 tablet (10 mg total) by mouth at bedtime. 90 tablet 3   naproxen sodium (ANAPROX) 220 MG tablet  Take 1 tablet (220 mg total) by mouth 2 (two) times daily as needed. 60 tablet 11   omeprazole (PRILOSEC) 20 MG capsule Take 1 capsule (20 mg total) by mouth daily. Place on hold 90 capsule 3   rosuvastatin (CRESTOR) 20 MG tablet Take 1 tablet (20 mg total) by mouth daily. 90 tablet 1   Spacer/Aero Chamber Mouthpiece MISC 1 each by Does not apply route every 6 (six) hours as needed. 1 each 0   tamoxifen (NOLVADEX) 20 MG tablet Take 1 tablet (20 mg total) by mouth daily. 30 tablet 11   valsartan (DIOVAN) 160 MG tablet Take 1 tablet (160 mg total) by mouth daily. Place on hold 90 tablet 3   No current facility-administered medications for this visit.    PHYSICAL EXAMINATION: ECOG PERFORMANCE STATUS: 1 - Symptomatic but completely ambulatory  Vitals:   04/04/23 0806  BP: (!) 159/80  Pulse: 77  Resp: 18  Temp: 98.2 F (36.8 C)  SpO2: 100%   Filed Weights   04/04/23 0806  Weight: (!) 335 lb 6.4 oz (152.1 kg)      LABORATORY DATA:  I have reviewed the data as listed    Latest Ref Rng & Units 05/18/2022    9:12 AM 11/15/2021    8:41 AM 05/17/2021    8:34 AM  CMP  Glucose 70 -  99 mg/dL 89  82  98   BUN 8 - 27 mg/dL 15  15  15    Creatinine 0.57 - 1.00 mg/dL 6.60  6.30  1.60   Sodium 134 - 144 mmol/L 140  141  143   Potassium 3.5 - 5.2 mmol/L 4.7  4.7  4.7   Chloride 96 - 106 mmol/L 101  103  104   CO2 20 - 29 mmol/L 22  24  27    Calcium 8.7 - 10.3 mg/dL 9.6  9.6  9.6   Total Protein 6.0 - 8.5 g/dL 7.0  6.9  6.8   Total Bilirubin 0.0 - 1.2 mg/dL 0.7  0.5  0.3   Alkaline Phos 44 - 121 IU/L 116  112  131   AST 0 - 40 IU/L 23  17  20    ALT 0 - 32 IU/L 17  12  15      Lab Results  Component Value Date   WBC 4.6 05/18/2022   HGB 13.1 05/18/2022   HCT 40.8 05/18/2022   MCV 81 05/18/2022   PLT 192 05/18/2022   NEUTROABS 2.9 05/18/2022    ASSESSMENT & PLAN:  Carcinoma of upper-inner quadrant of right female breast (HCC) 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   It appears that she is tolerating the tamoxifen fairly well since she started exercising and eating better. Patient wishes to stay on tamoxifen indefinitely because of extreme concerns for cancer recurrence especially because of her extensive family history of breast cancer.   Breast Cancer Surveillance: Mammogram 02/28/2023 no abnormalities. Postsurgical changes. Breast Density Category B.     RTC in 1 year    No orders of the defined types were placed in this encounter.  The patient has a good understanding of the overall plan. she agrees with it. she will call with any problems that may develop before the next visit here. Total time spent: 30 mins including face to face time and time spent for planning, charting and co-ordination of care   Tamsen Meek, MD 04/04/23

## 2023-04-17 ENCOUNTER — Encounter: Payer: Self-pay | Admitting: Adult Health

## 2023-06-20 ENCOUNTER — Other Ambulatory Visit: Payer: Self-pay | Admitting: Family Medicine

## 2023-06-20 ENCOUNTER — Encounter: Payer: Self-pay | Admitting: Family Medicine

## 2023-06-20 DIAGNOSIS — I10 Essential (primary) hypertension: Secondary | ICD-10-CM

## 2023-06-20 DIAGNOSIS — J302 Other seasonal allergic rhinitis: Secondary | ICD-10-CM

## 2023-06-20 DIAGNOSIS — J4530 Mild persistent asthma, uncomplicated: Secondary | ICD-10-CM

## 2023-06-20 NOTE — Telephone Encounter (Signed)
 This encounter was created in error - please disregard.

## 2023-06-20 NOTE — Telephone Encounter (Signed)
 Copied from CRM [id number]. Topic: Clinical - Medication Refill >> Jun 20, 2023  2:29 PM Eunice Blase wrote: Most Recent Primary Care Visit:  Provider: [facility]  Department: [facility]  Visit Type: OFFICE VISIT  Date: 05/18/2022  Medication: levocetirizine (XYZAL) 5 MG tablet, valsartan (DIOVAN) 160 MG tablet and montelukast (SINGULAIR) 10 MG tablet  Has the patient contacted their pharmacy? Yes (Agent: If no, request that the patient contact the pharmacy for the refill. If patient does not wish to contact the pharmacy document the reason why and proceed with request.) (Agent: If yes, when and what did the pharmacy advise?)  Is this the correct pharmacy for this prescription? Yes If no, delete pharmacy and type the correct one.  This is the patient's preferred pharmacy:  [facility] [address] [place] - [address] [place] [address] [place] [address] Phone: [phone] Fax: [phone]     Has the prescription been filled recently? Yes  Is the patient out of the medication? Yes  Has the patient been seen for an appointment in the last year OR does the patient have an upcoming appointment? Yes  Can we respond through MyChart? Yes  Agent: Please be advised that Rx refills may take up to 3 business days. We ask that you follow-up with your pharmacy.

## 2023-06-20 NOTE — Telephone Encounter (Signed)
 Dettinger NTBS last OV for chronic FU 04/20/22 NO RF sent to pharmacy last OV greater than a year

## 2023-06-20 NOTE — Telephone Encounter (Signed)
 Last Fill: Levocetirizine: 05/18/22    Motelukast: 05/18/22  Last OV: 05/18/22 Next OV: 07/13/23  Routing to provider for review/authorization.

## 2023-06-20 NOTE — Telephone Encounter (Signed)
 NA. Letter mailed

## 2023-06-21 MED ORDER — VALSARTAN 160 MG PO TABS: 160.0000 mg | ORAL_TABLET | Freq: Every day | ORAL | 3 refills | Status: AC

## 2023-06-21 MED ORDER — LEVOCETIRIZINE DIHYDROCHLORIDE 5 MG PO TABS: 5.0000 mg | ORAL_TABLET | Freq: Every evening | ORAL | 3 refills | Status: AC

## 2023-06-21 MED ORDER — MONTELUKAST SODIUM 10 MG PO TABS: 10.0000 mg | ORAL_TABLET | Freq: Every day | ORAL | 3 refills | Status: AC

## 2023-07-13 ENCOUNTER — Ambulatory Visit: Payer: Self-pay | Admitting: Family Medicine

## 2023-08-24 ENCOUNTER — Encounter: Payer: Self-pay | Admitting: Family Medicine

## 2023-08-24 ENCOUNTER — Encounter: Payer: Self-pay | Admitting: Adult Health

## 2023-08-24 ENCOUNTER — Ambulatory Visit: Payer: Self-pay | Admitting: Family Medicine

## 2023-08-24 VITALS — BP 149/80 | HR 71 | Ht 69.0 in | Wt 304.0 lb

## 2023-08-24 DIAGNOSIS — J4530 Mild persistent asthma, uncomplicated: Secondary | ICD-10-CM | POA: Diagnosis not present

## 2023-08-24 DIAGNOSIS — D509 Iron deficiency anemia, unspecified: Secondary | ICD-10-CM | POA: Diagnosis not present

## 2023-08-24 DIAGNOSIS — I10 Essential (primary) hypertension: Secondary | ICD-10-CM

## 2023-08-24 DIAGNOSIS — E782 Mixed hyperlipidemia: Secondary | ICD-10-CM | POA: Diagnosis not present

## 2023-08-24 LAB — LIPID PANEL

## 2023-08-24 MED ORDER — SEMAGLUTIDE-WEIGHT MANAGEMENT 2.4 MG/0.75ML ~~LOC~~ SOAJ: 2.4000 mg | SUBCUTANEOUS | 0 refills | Status: AC

## 2023-08-24 MED ORDER — SEMAGLUTIDE-WEIGHT MANAGEMENT 0.5 MG/0.5ML ~~LOC~~ SOAJ: 0.5000 mg | SUBCUTANEOUS | 0 refills | Status: AC

## 2023-08-24 MED ORDER — SEMAGLUTIDE-WEIGHT MANAGEMENT 1.7 MG/0.75ML ~~LOC~~ SOAJ: 1.7000 mg | SUBCUTANEOUS | 0 refills | Status: AC

## 2023-08-24 MED ORDER — SEMAGLUTIDE-WEIGHT MANAGEMENT 1 MG/0.5ML ~~LOC~~ SOAJ: 1.0000 mg | SUBCUTANEOUS | 0 refills | Status: AC

## 2023-08-24 MED ORDER — SEMAGLUTIDE-WEIGHT MANAGEMENT 0.25 MG/0.5ML ~~LOC~~ SOAJ: 0.2500 mg | SUBCUTANEOUS | 0 refills | Status: AC

## 2023-08-24 NOTE — Progress Notes (Signed)
 BP (!) 149/80   Pulse 71   Ht 5\' 9"  (1.753 m)   Wt (!) 304 lb (137.9 kg)   LMP 12/01/2011   SpO2 97%   BMI 44.89 kg/m    Subjective:   Patient ID: Kayla Neal, female    DOB: 1961-03-21, 63 y.o.   MRN: [id number]  HPI: Kayla Neal is a 63 y.o. female presenting on 08/24/2023 for Medical Management of Chronic Issues, Hyperlipidemia, and Hypertension   HPI Hypertension Patient is currently on valsartan , and their blood pressure today is 149/80. Patient denies any lightheadedness or dizziness. Patient denies headaches, blurred vision, chest pains, shortness of breath, or weakness. Denies any side effects from medication and is content with current medication.   Hyperlipidemia Patient is coming in for recheck of his hyperlipidemia. The patient is currently taking Crestor . They deny any issues with myalgias or history of liver damage from it. They deny any focal numbness or weakness or chest pain.   Obesity Patient is coming in today to discuss obesity.  She has a BMI of 44.8 and has been trying to do with diet and trying to build more active but she struggles with her weight.  Anemia recheck Patient is coming in today for anemia recheck as well. Patient denies any symptoms  Asthma recheck Patient is coming in today for asthma recheck and she currently takes albuterol  and Singulair . She denies any major issues  Relevant past medical, surgical, family and social history reviewed and updated as indicated. Interim medical history since our last visit reviewed. Allergies and medications reviewed and updated.  Review of Systems  Constitutional:  Negative for chills and fever.  Eyes:  Negative for visual disturbance.  Respiratory:  Negative for chest tightness and shortness of breath.   Cardiovascular:  Negative for chest pain and leg swelling.  Genitourinary:  Negative for difficulty urinating and dysuria.  Musculoskeletal:  Negative for back pain and gait problem.   Skin:  Negative for rash.  Neurological:  Negative for dizziness, light-headedness and headaches.  Psychiatric/Behavioral:  Negative for agitation and behavioral problems.   All other systems reviewed and are negative.   Per HPI unless specifically indicated above   Allergies as of 08/24/2023       Reactions   Lisinopril Nausea And Vomiting, Other (See Comments)   Severe stomach cramps   Bee Pollen Hives   Has Hives with Bee Stings   Oxycodone  Other (See Comments)   Patient prefers not to be on this med.  It makes her very loopy and unable to function.        Medication List        Accurate as of Aug 24, 2023  3:41 PM. If you have any questions, ask your nurse or doctor.          STOP taking these medications    Trulicity  1.5 MG/0.5ML Soaj Generic drug: Dulaglutide  Stopped by: Lucio Sabin Winola Drum       TAKE these medications    albuterol  108 (90 Base) MCG/ACT inhaler Commonly known as: VENTOLIN  HFA INHALE 1 PUFF INTO LUNGS ONCE DAILY AS NEEDED FOR ASTHMA   EPINEPHrine  0.3 mg/0.3 mL Soaj injection Commonly known as: EPI-PEN Inject 0.3 mg into the muscle as needed for anaphylaxis. Place on hold   EVENING PRIMROSE OIL PO Take by mouth daily as needed.   fluticasone  50 MCG/ACT nasal spray Commonly known as: FLONASE  Place 2 sprays into both nostrils daily as needed for allergies or rhinitis. Place  on hold   levocetirizine 5 MG tablet Commonly known as: XYZAL  Take 1 tablet (5 mg total) by mouth every evening.   Menthol  (Topical Analgesic) 4 % Gel APPLY TO AFFECTED AREA UP TO 4 TIMES DAILY   montelukast  10 MG tablet Commonly known as: SINGULAIR  Take 1 tablet (10 mg total) by mouth at bedtime.   naproxen  sodium 220 MG tablet Commonly known as: ALEVE  Take 1 tablet (220 mg total) by mouth 2 (two) times daily as needed.   omeprazole  20 MG capsule Commonly known as: PRILOSEC Take 1 capsule (20 mg total) by mouth daily. Place on hold   rosuvastatin  20 MG  tablet Commonly known as: Crestor  Take 1 tablet (20 mg total) by mouth daily.   Semaglutide -Weight Management 0.25 MG/0.5ML Soaj Inject 0.25 mg into the skin once a week for 28 days. Started by: Lucio Sabin Shaena Parkerson   Semaglutide -Weight Management 0.5 MG/0.5ML Soaj Inject 0.5 mg into the skin once a week for 28 days. Start taking on: September 22, 2023 Started by: Lucio Sabin Mikaele Stecher   Semaglutide -Weight Management 1 MG/0.5ML Soaj Inject 1 mg into the skin once a week for 28 days. Start taking on: October 21, 2023 Started by: Lucio Sabin Bunyan Brier   Semaglutide -Weight Management 1.7 MG/0.75ML Soaj Inject 1.7 mg into the skin once a week for 28 days. Start taking on: November 19, 2023 Started by: Lucio Sabin Tallia Moehring   Semaglutide -Weight Management 2.4 MG/0.75ML Soaj Inject 2.4 mg into the skin once a week for 28 days. Start taking on: December 18, 2023 Started by: Lucio Sabin Jarmal Lewelling   Spacer/Aero Chamber Mouthpiece Misc 1 each by Does not apply route every 6 (six) hours as needed.   tamoxifen  20 MG tablet Commonly known as: NOLVADEX  Take 1 tablet (20 mg total) by mouth daily.   valsartan  160 MG tablet Commonly known as: DIOVAN  Take 1 tablet (160 mg total) by mouth daily. Place on hold         Objective:   BP (!) 149/80   Pulse 71   Ht 5\' 9"  (1.753 m)   Wt (!) 304 lb (137.9 kg)   LMP 12/01/2011   SpO2 97%   BMI 44.89 kg/m   Wt Readings from Last 3 Encounters:  08/24/23 (!) 304 lb (137.9 kg)  04/04/23 (!) 335 lb 6.4 oz (152.1 kg)  05/18/22 (!) 326 lb (147.9 kg)    Physical Exam Vitals and nursing note reviewed.  Constitutional:      General: She is not in acute distress.    Appearance: She is well-developed. She is not diaphoretic.  Eyes:     Conjunctiva/sclera: Conjunctivae normal.  Cardiovascular:     Rate and Rhythm: Normal rate and regular rhythm.     Heart sounds: Normal heart sounds. No murmur heard. Pulmonary:     Effort: Pulmonary effort is normal. No  respiratory distress.     Breath sounds: Normal breath sounds. No wheezing.  Musculoskeletal:        General: No swelling or tenderness. Normal range of motion.  Skin:    General: Skin is warm and dry.     Findings: No rash.  Neurological:     Mental Status: She is alert and oriented to person, place, and time.     Coordination: Coordination normal.  Psychiatric:        Behavior: Behavior normal.       Assessment & Plan:   Problem List Items Addressed This Visit       Cardiovascular and  Mediastinum   Essential hypertension     Respiratory   Asthma - Primary     Other   Iron deficiency anemia   Hyperlipidemia   Other Visit Diagnoses       Morbid obesity (HCC)       Relevant Medications   Semaglutide -Weight Management 0.25 MG/0.5ML SOAJ   Semaglutide -Weight Management 0.5 MG/0.5ML SOAJ (Start on 09/22/2023)   Semaglutide -Weight Management 1 MG/0.5ML SOAJ (Start on 10/21/2023)   Semaglutide -Weight Management 1.7 MG/0.75ML SOAJ (Start on 11/19/2023)   Semaglutide -Weight Management 2.4 MG/0.75ML SOAJ (Start on 12/18/2023)       Patient's BMI is >30 mg/m2.  Patient's current BMI is Body mass index is 44.89 kg/m.Aaron Aas  Patient is currently enrolled in a healthy eating plan along with encouraged exercise.  Patient has bp elevated. Patient has contraindications to phentermine, Contrave & Qsymia (contains phentermine).  Patient does not have a personal or family history of medullary thyroid  carcinoma (MTC) or Multiple Endocrine Neoplasia syndrome type 2 (MEN 2).  Follow up plan: Return in about 6 months (around 02/24/2024), or if symptoms worsen or fail to improve, for Hypertension and hyperlipidemia.  Counseling provided for all of the vaccine components No orders of the defined types were placed in this encounter.   Jolyne Needs, MD [facility] 08/24/2023, 3:41 PM

## 2023-08-24 NOTE — Addendum Note (Signed)
 Addended by: Jolyne Needs on: 08/24/2023 03:55 PM   Modules accepted: Orders

## 2023-08-25 LAB — LIPID PANEL
Chol/HDL Ratio: 4.1 ratio (ref 0.0–4.4)
Cholesterol, Total: 243 mg/dL — ABNORMAL HIGH (ref 100–199)
HDL: 59 mg/dL (ref >39)
LDL Chol Calc (NIH): 161 mg/dL — ABNORMAL HIGH (ref 0–99)
Triglycerides: 131 mg/dL (ref 0–149)
VLDL Cholesterol Cal: 23 mg/dL (ref 5–40)

## 2023-08-25 LAB — CBC WITH DIFFERENTIAL/PLATELET
Basophils Absolute: 0 10*3/uL (ref 0.0–0.2)
Basos: 0 %
EOS (ABSOLUTE): 0.2 10*3/uL (ref 0.0–0.4)
Eos: 3 %
Hematocrit: 41.3 % (ref 34.0–46.6)
Hemoglobin: 13.1 g/dL (ref 11.1–15.9)
Immature Grans (Abs): 0 10*3/uL (ref 0.0–0.1)
Immature Granulocytes: 0 %
Lymphocytes Absolute: 1.6 10*3/uL (ref 0.7–3.1)
Lymphs: 29 %
MCH: 26.1 pg — ABNORMAL LOW (ref 26.6–33.0)
MCHC: 31.7 g/dL (ref 31.5–35.7)
MCV: 82 fL (ref 79–97)
Monocytes Absolute: 0.4 10*3/uL (ref 0.1–0.9)
Monocytes: 8 %
Neutrophils Absolute: 3.3 10*3/uL (ref 1.4–7.0)
Neutrophils: 60 %
Platelets: 203 10*3/uL (ref 150–450)
RBC: 5.01 x10E6/uL (ref 3.77–5.28)
RDW: 13.1 % (ref 11.7–15.4)
WBC: 5.5 10*3/uL (ref 3.4–10.8)

## 2023-08-25 LAB — CMP14+EGFR
ALT: 10 IU/L (ref 0–32)
AST: 15 IU/L (ref 0–40)
Albumin: 4.4 g/dL (ref 3.9–4.9)
Alkaline Phosphatase: 129 IU/L — ABNORMAL HIGH (ref 44–121)
BUN/Creatinine Ratio: 17 (ref 12–28)
BUN: 13 mg/dL (ref 8–27)
Bilirubin Total: 0.4 mg/dL (ref 0.0–1.2)
CO2: 25 mmol/L (ref 20–29)
Calcium: 9.5 mg/dL (ref 8.7–10.3)
Chloride: 101 mmol/L (ref 96–106)
Creatinine, Ser: 0.76 mg/dL (ref 0.57–1.00)
Globulin, Total: 3 g/dL (ref 1.5–4.5)
Glucose: 84 mg/dL (ref 70–99)
Potassium: 4.3 mmol/L (ref 3.5–5.2)
Sodium: 139 mmol/L (ref 134–144)
Total Protein: 7.4 g/dL (ref 6.0–8.5)
eGFR: 89 mL/min/{1.73_m2} (ref >59)

## 2023-08-25 LAB — TSH: TSH: 3.41 u[IU]/mL (ref 0.450–4.500)

## 2023-08-28 ENCOUNTER — Encounter: Payer: Self-pay | Admitting: Family Medicine

## 2023-12-12 ENCOUNTER — Ambulatory Visit: Payer: Self-pay

## 2023-12-12 ENCOUNTER — Telehealth: Payer: Self-pay | Admitting: Family Medicine

## 2023-12-12 NOTE — Telephone Encounter (Signed)
 Summary: possible sinus infection   Possible sinus infection, pressure across bridge of nose and center of forehead. Patient requesting something to be sent in, [phone]     Patient called but unable to speak to patient. Voicemail left for patient to call back to Nurse Triage.

## 2023-12-12 NOTE — Telephone Encounter (Signed)
 Left message for patient to call back and schedule appt

## 2023-12-12 NOTE — Telephone Encounter (Unsigned)
 Copied from CRM #[id number]. Topic: General - Other >> Dec 12, 2023 12:24 PM Kayla Neal wrote: Reason for CRM: Patient tried to schedule for sinus infection but availability too far out, patient would like a return phone call when possible.

## 2023-12-12 NOTE — Telephone Encounter (Signed)
 FYI Only or Action Required?: Action required by provider: Patient requesting medication for symptoms and a callback.  Patient was last seen in primary care on 08/24/2023 by Dettinger, Fonda LABOR, MD.  Called Nurse Triage reporting Facial Pain.  Symptoms began several months ago.  Interventions attempted: OTC medications: Generic Xyzal  and Singulair .  Symptoms are: gradually worsening.  Triage Disposition: See PCP When Office is Open (Within 3 Days)  Patient/caregiver understands and will follow disposition?: Unsure           Reason for Disposition  [1] Sinus congestion (pressure, fullness) AND [2] present > 10 days  Answer Assessment - Initial Assessment Questions Patient states symptoms are worsening with a headache. Has hx of seasonal allergies Currently using otc generic version of Xyzal  and Singulair .  Pt states she is unable to come in for an appointment,ent due to school just starting. Pt requesting medication be sent in to her pharmacy for symptoms. Pharmacy verified as [facility] [address] [place] - [address], [place] [phone]. Pt requesting a call back as well.   1. LOCATION: Where does it hurt?      Head, across nose and center of forehead and R eye  2. ONSET: When did the sinus pain start?  (e.g., hours, days)      June  3. SEVERITY: How bad is the pain?   (Scale 0-10; or none, mild, moderate or severe)     6/10 4. RECURRENT SYMPTOM: Have you ever had sinus problems before? If Yes, ask: When was the last time? and What happened that time?      Yes, states she struggles with this and seasonal allergies.  5. NASAL CONGESTION: Is the nose blocked? If Yes, ask: Can you open it or must you breathe through your mouth?     Has to breathe out of her mouth due to her nose feeling blocked.  6. NASAL DISCHARGE: Do you have discharge from your nose? If so ask, What color?     None. Pt states her nose feels dry.  7. FEVER:  Do you have a fever? If Yes, ask: What is it, how was it measured, and when did it start?      States she felt warm but has not had a fever.  8. OTHER SYMPTOMS: Do you have any other symptoms? (e.g., sore throat, cough, earache, difficulty breathing)     Slight difficulty breathing due to nasal congestion.  Protocols used: Sinus Pain or Congestion-A-AH

## 2023-12-12 NOTE — Telephone Encounter (Signed)
 Lmtcb to schedule an apt

## 2023-12-13 ENCOUNTER — Ambulatory Visit

## 2023-12-14 ENCOUNTER — Ambulatory Visit

## 2024-01-14 ENCOUNTER — Other Ambulatory Visit: Payer: Self-pay | Admitting: Hematology and Oncology

## 2024-01-14 DIAGNOSIS — C50211 Malignant neoplasm of upper-inner quadrant of right female breast: Secondary | ICD-10-CM

## 2024-02-02 ENCOUNTER — Ambulatory Visit

## 2024-02-02 DIAGNOSIS — I10 Essential (primary) hypertension: Secondary | ICD-10-CM

## 2024-02-02 DIAGNOSIS — Z853 Personal history of malignant neoplasm of breast: Secondary | ICD-10-CM | POA: Diagnosis not present

## 2024-02-02 DIAGNOSIS — U071 COVID-19: Secondary | ICD-10-CM | POA: Diagnosis not present

## 2024-02-02 DIAGNOSIS — S8261XD Displaced fracture of lateral malleolus of right fibula, subsequent encounter for closed fracture with routine healing: Secondary | ICD-10-CM | POA: Diagnosis not present

## 2024-02-02 DIAGNOSIS — Z9181 History of falling: Secondary | ICD-10-CM

## 2024-02-07 ENCOUNTER — Ambulatory Visit

## 2024-02-15 ENCOUNTER — Other Ambulatory Visit: Payer: Self-pay | Admitting: Family Medicine

## 2024-02-15 DIAGNOSIS — J4530 Mild persistent asthma, uncomplicated: Secondary | ICD-10-CM

## 2024-02-19 ENCOUNTER — Encounter: Payer: Self-pay | Admitting: Adult Health

## 2024-02-26 ENCOUNTER — Ambulatory Visit: Payer: Self-pay | Admitting: Family Medicine

## 2024-02-29 ENCOUNTER — Other Ambulatory Visit: Payer: Self-pay | Admitting: Hematology and Oncology

## 2024-02-29 DIAGNOSIS — C50211 Malignant neoplasm of upper-inner quadrant of right female breast: Secondary | ICD-10-CM

## 2024-03-30 ENCOUNTER — Other Ambulatory Visit: Payer: Self-pay | Admitting: Hematology and Oncology

## 2024-03-30 DIAGNOSIS — C50211 Malignant neoplasm of upper-inner quadrant of right female breast: Secondary | ICD-10-CM

## 2024-04-04 ENCOUNTER — Inpatient Hospital Stay: Payer: BC Managed Care – PPO | Admitting: Hematology and Oncology

## 2024-04-08 ENCOUNTER — Other Ambulatory Visit: Payer: Self-pay

## 2024-04-08 ENCOUNTER — Ambulatory Visit: Admitting: Physical Therapy

## 2024-04-08 ENCOUNTER — Encounter: Payer: Self-pay | Admitting: Physical Therapy

## 2024-04-08 DIAGNOSIS — M25671 Stiffness of right ankle, not elsewhere classified: Secondary | ICD-10-CM | POA: Insufficient documentation

## 2024-04-08 DIAGNOSIS — M25571 Pain in right ankle and joints of right foot: Secondary | ICD-10-CM | POA: Insufficient documentation

## 2024-04-08 DIAGNOSIS — R600 Localized edema: Secondary | ICD-10-CM | POA: Diagnosis present

## 2024-04-08 NOTE — Therapy (Signed)
 " OUTPATIENT PHYSICAL THERAPY LOWER EXTREMITY EVALUATION   Patient Name: Kayla Neal MRN: [id number] DOB:[birth year], 63 y.o., female Today's Date: 04/08/2024  END OF SESSION:  PT End of Session - 04/08/24 1630     Visit Number 1    Number of Visits 12    Date for Recertification  05/20/24    PT Start Time 0230    PT Stop Time 0300    PT Time Calculation (min) 30 min    Activity Tolerance Patient tolerated treatment well    Behavior During Therapy [facility] for tasks assessed/performed          Past Medical History:  Diagnosis Date   Allergy    LISINOPRIL=N,V   Anemia 10/18/2011   placed on iron tid for Hgb of 8   Arthritis    knee pain-uses acetaminophen  for pain control prn   Asthma    seasonal allergies related-prn albuterol    Breast cancer, right breast, UIQ, 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     Cervical polyp    Fibroids    MULTIPLE IN ENDOMETRIAL CAVITY   GERD (gastroesophageal reflux disease)    no meds required in past 3 years   Hypertension    under control without medication as of 11/11/11-no meds for  at least 1 yr   Nasal congestion    Personal history of radiation therapy 02/2012   Submucous myoma of uterus    Use of megestrol  acetate (Megace )    For uterine bleeding   Uterine bleeding, dysfunctional    severe   Wheezing    hx  - at allergy season only   Past Surgical History:  Procedure Laterality Date   BREAST BIOPSY  11/14/2011   Procedure: BREAST BIOPSY WITH NEEDLE LOCALIZATION;  Surgeon: Sherlean JINNY Laughter, MD;  Location: [facility];  Service: General;  Laterality: Right;  needle localization right breast calcifications   BREAST LUMPECTOMY  11/14/11   RIGHT  BREAST=DUCTAL CA IN SITU,W/CALCIFICATIONS,HIGH GRADE,2.4CM,SURGICAL RESECTION MARGINS NEG,  DR. KYM LAUGHTER   CERVIX POLYP  10/17/11   NO MALIGNANCY   ENDOMETRIAL BIOPSY  12/09/11   UTERUS=BENIGN ENDOMETRIAL GLANDS,BENIGN SQUAMOUS EPITHELIAL CELLS,NO  DYSPLASIA OR MALIGNANCY   TONSILLECTOMY     TOOTH EXTRACTION  2000   VAGINAL HYSTERECTOMY  01/26/2012   Procedure: HYSTERECTOMY VAGINAL;  Surgeon: Toribio LITTIE Campanile, MD;  Location: [facility];  Service: Gynecology;  Laterality: N/A;   VAGINAL HYSTERECTOMY     Patient Active Problem List   Diagnosis Date Noted   Hyperlipidemia 05/17/2021   GERD (gastroesophageal reflux disease) 03/27/2018   Essential hypertension 02/02/2015   Iron deficiency anemia 09/18/2012   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    REFERRING DIAG: S/p ORIF right ankle.  THERAPY DIAG:  Pain in right ankle and joints of right foot  Stiffness of right ankle, not elsewhere classified  Localized edema  Rationale for Evaluation and Treatment: Rehabilitation  ONSET DATE: 01/01/24  SUBJECTIVE:   SUBJECTIVE STATEMENT: The patient presents to the clinic s/p right ankle ORIF performed on 01/17/24 for a distal fibular fracture and the an I and D on 11/4.  She presents to the clinic in wheelchair with CAM boot donned.  Her pain is rated at a 1-2/10 today.  She has started walking with a walker at home, weight bearing as  tolerated over her right LE with CAM boot donned.  She has been icing and elevating to keep her swelling down.  She has also been performing active right active dorsi/plantarflexion, inversion and eversion.  She has TED hose donned over a dressing (over her lateral incisional site and ACE wrap.    PERTINENT HISTORY: Please see above.   PAIN:  Are you having pain? Yes: NPRS scale: 1-2/10.   Pain location: Right ankle.   Pain description: Ache.   Aggravating factors: Increased up time.   Relieving factors: Ice and elevation.    PRECAUTIONS: Other: Right CAM boot.    RED FLAGS: None   WEIGHT BEARING RESTRICTIONS: WBAT with right CAM boot donned.    FALLS:  Has patient  fallen in last 6 months? Yes. Number of falls 1 (01/01/24).    LIVING ENVIRONMENT: Lives with: lives alone Lives in: House/apartment Stairs: No Has following equipment at home: Wheelchair (manual)  OCCUPATION: Works for the school system.    PLOF: Independent  PATIENT GOALS: Get back to where she was before the injury.     OBJECTIVE:   PATIENT SURVEYS:  LEFS:  23/80.    EDEMA:  Minimal+ edema noted today (right foot and ankle).     LOWER EXTREMITY ROM:  Right ankle active dorsiflexion to -5 degrees from neutral, plantarflexion to 35 degrees, inversion to 10 degrees and eversion to neutral.    GAIT: Patient walked in clinic (20 feet) with a FWW and weight bearing as tolerated with CAM boot donned on right.    OBSERVATION:  Right ankle incisional site scabbed over.                                                                                                                                TREATMENT DATE:     PATIENT EDUCATION:  Education details: Ankle ABC's. Person educated: Patient Education method: Medical Illustrator Education comprehension: verbalized understanding and returned demonstration  HOME EXERCISE PROGRAM: As above.    ASSESSMENT:  CLINICAL IMPRESSION: The patient presents to OPPT s/p right ankle ORIF (01/17/24) and I and D (02/20/24).  She walked in the clinic today with a FWW with a step to gait pattern and wbat over her right LE with CAM boot donned.  She has an expected loss of range of motion.  Her swelling was min+.  She has done an excellent job icing and elevating.  She also had a TED hose and ACE wrap intact.  She has been performing active ankle exercises as well.  Patient will benefit from skilled physical therapy intervention to address pain and deficits.      OBJECTIVE IMPAIRMENTS: Abnormal gait, decreased activity tolerance, difficulty walking, decreased ROM, increased edema, and pain.   ACTIVITY LIMITATIONS: carrying, lifting,  bending, standing, and locomotion level  PARTICIPATION LIMITATIONS: meal prep, cleaning, laundry, driving, shopping, community activity, occupation, and yard work  PERSONAL FACTORS: 1 comorbidity: I and D are  also affecting patient's functional outcome.   REHAB POTENTIAL: Good  CLINICAL DECISION MAKING: Stable/uncomplicated  EVALUATION COMPLEXITY: Low   GOALS:  SHORT TERM GOALS: Target date: 04/22/24  Ind with an initial HEP. Goal status: INITIAL   LONG TERM GOALS: Target date: 05/20/24 (actual timelines in accord with MD guidelines).    Ind with an advanced HEP.  Goal status: INITIAL  2.  Increase right ankle dorsiflexion to 6- 8 degrees to normalize the patient's gait pattern.  Goal status: INITIAL  3.  Increase right ankle strength to 4+ to 5/5 to increase stability for functional tasks.  Goal status: INITIAL  4.  Walk 500 feet with a straight cane with a normal gait cycle.  Goal status: INITIAL  5.  Perform ADL's with pain not > 3/10.  Goal status: INITIAL  6.  Improve LEFS score by at least 20 points.  Goal status: INITIAL   PLAN:  PT FREQUENCY: 2x/week  PT DURATION: 6 weeks  PLANNED INTERVENTIONS: 97110-Therapeutic exercises, 97530- Therapeutic activity, [id number]- Neuromuscular re-education, 97535- Self Care, [id number]- Manual therapy, G0283- Electrical stimulation (unattended), 97016- Vasopneumatic device, Patient/Family education, and Cryotherapy  PLAN FOR NEXT SESSION: Nustep with CAM boot donned, seated BAPS (Level 1-2), seated Rockerboard (dorsi/plantarflexion), gait training.     Iwalani Templeton, PT 04/08/2024, 4:55 PM  "

## 2024-04-10 ENCOUNTER — Ambulatory Visit: Admitting: *Deleted

## 2024-04-10 ENCOUNTER — Encounter: Payer: Self-pay | Admitting: *Deleted

## 2024-04-10 DIAGNOSIS — M25671 Stiffness of right ankle, not elsewhere classified: Secondary | ICD-10-CM

## 2024-04-10 DIAGNOSIS — M25571 Pain in right ankle and joints of right foot: Secondary | ICD-10-CM | POA: Diagnosis not present

## 2024-04-10 DIAGNOSIS — R600 Localized edema: Secondary | ICD-10-CM

## 2024-04-10 NOTE — Therapy (Signed)
 " OUTPATIENT PHYSICAL THERAPY LOWER EXTREMITY EVALUATION   Patient Name: Kayla Neal MRN: [id number] DOB:05-Mar-1961, 63 y.o., female Today's Date: 04/10/2024  END OF SESSION:  PT End of Session - 04/10/24 1112     Visit Number 2    Number of Visits 12    Date for Recertification  05/20/24    PT Start Time 1100    PT Stop Time 1150    PT Time Calculation (min) 50 min          Past Medical History:  Diagnosis Date   Allergy    LISINOPRIL=N,V   Anemia 10/18/2011   placed on iron tid for Hgb of 8   Arthritis    knee pain-uses acetaminophen  for pain control prn   Asthma    seasonal allergies related-prn albuterol    Breast cancer, right breast, UIQ, 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     Cervical polyp    Fibroids    MULTIPLE IN ENDOMETRIAL CAVITY   GERD (gastroesophageal reflux disease)    no meds required in past 3 years   Hypertension    under control without medication as of 11/11/11-no meds for  at least 1 yr   Nasal congestion    Personal history of radiation therapy 02/2012   Submucous myoma of uterus    Use of megestrol  acetate (Megace )    For uterine bleeding   Uterine bleeding, dysfunctional    severe   Wheezing    hx  - at allergy season only   Past Surgical History:  Procedure Laterality Date   BREAST BIOPSY  11/14/2011   Procedure: BREAST BIOPSY WITH NEEDLE LOCALIZATION;  Surgeon: Sherlean JINNY Laughter, MD;  Location: [facility];  Service: General;  Laterality: Right;  needle localization right breast calcifications   BREAST LUMPECTOMY  11/14/11   RIGHT  BREAST=DUCTAL CA IN SITU,W/CALCIFICATIONS,HIGH GRADE,2.4CM,SURGICAL RESECTION MARGINS NEG,  DR. KYM LAUGHTER   CERVIX POLYP  10/17/11   NO MALIGNANCY   ENDOMETRIAL BIOPSY  12/09/11   UTERUS=BENIGN ENDOMETRIAL GLANDS,BENIGN SQUAMOUS EPITHELIAL CELLS,NO DYSPLASIA OR MALIGNANCY   TONSILLECTOMY     TOOTH EXTRACTION  2000   VAGINAL HYSTERECTOMY  01/26/2012    Procedure: HYSTERECTOMY VAGINAL;  Surgeon: Toribio LITTIE Campanile, MD;  Location: [facility];  Service: Gynecology;  Laterality: N/A;   VAGINAL HYSTERECTOMY     Patient Active Problem List   Diagnosis Date Noted   Hyperlipidemia 05/17/2021   GERD (gastroesophageal reflux disease) 03/27/2018   Essential hypertension 02/02/2015   Iron deficiency anemia 09/18/2012   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    REFERRING DIAG: S/p ORIF right ankle.  THERAPY DIAG:  Pain in right ankle and joints of right foot  Stiffness of right ankle, not elsewhere classified  Localized edema  Rationale for Evaluation and Treatment: Rehabilitation  ONSET DATE: 01/01/24  SUBJECTIVE:   SUBJECTIVE STATEMENT: The patient presents to the clinic s/p right ankle ORIF performed on 01/17/24.Doing okay   Please see above.   PAIN:  Are you having pain? Yes: NPRS scale: 1-2/10.   Pain location: Right ankle.   Pain description: Ache.   Aggravating factors: Increased up time.   Relieving factors: Ice and elevation.    PRECAUTIONS: Other: Right CAM boot.    RED FLAGS: None   WEIGHT BEARING RESTRICTIONS: WBAT with right CAM  boot donned.    FALLS:  Has patient fallen in last 6 months? Yes. Number of falls 1 (01/01/24).    LIVING ENVIRONMENT: Lives with: lives alone Lives in: House/apartment Stairs: No Has following equipment at home: Wheelchair (manual)  OCCUPATION: Works for the school system.    PLOF: Independent  PATIENT GOALS: Get back to where she was before the injury.     OBJECTIVE:   PATIENT SURVEYS:  LEFS:  23/80.    EDEMA:  Minimal+ edema noted today (right foot and ankle).     LOWER EXTREMITY ROM:  Right ankle active dorsiflexion to -5 degrees from neutral, plantarflexion to 35 degrees, inversion to 10 degrees and eversion to neutral.     GAIT: Patient walked in clinic (20 feet) with a FWW and weight bearing as tolerated with CAM boot donned on right.    OBSERVATION:  Right ankle incisional site scabbed over.                                                                                                                                TREATMENT DATE:  04-10-24                                    EXERCISE LOG  RT Ankle  Exercise Repetitions and Resistance Comments  Nustep X 13 mins L4 seat 10  CAM boot donned   Seated Rocker board DF/PF x , Side/Side x 3 mins   Seated Dyna Disc  CW/CCW  x 4 mins            Blank cell = exercise not performed today  Reviewed HEP VASO x 15 mins low pressure RT foot   PATIENT EDUCATION:  Education details: Ankle ABC's. Person educated: Patient Education method: Medical Illustrator Education comprehension: verbalized understanding and returned demonstration  HOME EXERCISE PROGRAM: As above.    ASSESSMENT:  CLINICAL IMPRESSION: The patient presents to OPPT s/p right ankle ORIF (01/17/24) and I and D (02/20/24).  Rx focused on review  of HEP as well as nustep and seated Rocker board and Dyna-disc for ROM. Vaso x 15 Mins       OBJECTIVE IMPAIRMENTS: Abnormal gait, decreased activity tolerance, difficulty walking, decreased ROM, increased edema, and pain.   ACTIVITY LIMITATIONS: carrying, lifting, bending, standing, and locomotion level  PARTICIPATION LIMITATIONS: meal prep, cleaning, laundry, driving, shopping, community activity, occupation, and yard work  PERSONAL FACTORS: 1 comorbidity: I and D are also affecting patient's functional outcome.   REHAB POTENTIAL: Good  CLINICAL DECISION MAKING: Stable/uncomplicated  EVALUATION COMPLEXITY: Low   GOALS:  SHORT TERM GOALS: Target date: 04/22/24  Ind with an initial HEP. Goal status: INITIAL   LONG TERM GOALS: Target date: 05/20/24 (actual timelines in accord with MD guidelines).    Ind with an  advanced HEP.  Goal status: INITIAL  2.  Increase right  ankle dorsiflexion to 6- 8 degrees to normalize the patient's gait pattern.  Goal status: INITIAL  3.  Increase right ankle strength to 4+ to 5/5 to increase stability for functional tasks.  Goal status: INITIAL  4.  Walk 500 feet with a straight cane with a normal gait cycle.  Goal status: INITIAL  5.  Perform ADL's with pain not > 3/10.  Goal status: INITIAL  6.  Improve LEFS score by at least 20 points.  Goal status: INITIAL   PLAN:  PT FREQUENCY: 2x/week  PT DURATION: 6 weeks  PLANNED INTERVENTIONS: 97110-Therapeutic exercises, 97530- Therapeutic activity, [id number]- Neuromuscular re-education, 97535- Self Care, 02859- Manual therapy, G0283- Electrical stimulation (unattended), 97016- Vasopneumatic device, Patient/Family education, and Cryotherapy  PLAN FOR NEXT SESSION: Nustep with CAM boot donned, seated BAPS (Level 1-2), seated Rockerboard (dorsi/plantarflexion), gait training.     Cheryllynn Sarff,CHRIS, PTA 04/10/2024, 12:20 PM  "

## 2024-04-15 ENCOUNTER — Ambulatory Visit

## 2024-04-15 DIAGNOSIS — M25671 Stiffness of right ankle, not elsewhere classified: Secondary | ICD-10-CM

## 2024-04-15 DIAGNOSIS — M25571 Pain in right ankle and joints of right foot: Secondary | ICD-10-CM | POA: Diagnosis not present

## 2024-04-15 DIAGNOSIS — R600 Localized edema: Secondary | ICD-10-CM

## 2024-04-15 NOTE — Therapy (Signed)
 " OUTPATIENT PHYSICAL THERAPY LOWER EXTREMITY TREATMENT   Patient Name: Kayla Neal MRN: [id number] DOB:03-15-1961, 63 y.o., female Today's Date: 04/15/2024  END OF SESSION:  PT End of Session - 04/15/24 1304     Visit Number 3    Number of Visits 12    Date for Recertification  05/20/24    PT Start Time 1300    PT Stop Time 1403    PT Time Calculation (min) 63 min          Past Medical History:  Diagnosis Date   Allergy    LISINOPRIL=N,V   Anemia 10/18/2011   placed on iron tid for Hgb of 8   Arthritis    knee pain-uses acetaminophen  for pain control prn   Asthma    seasonal allergies related-prn albuterol    Breast cancer, right breast, UIQ, 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     Cervical polyp    Fibroids    MULTIPLE IN ENDOMETRIAL CAVITY   GERD (gastroesophageal reflux disease)    no meds required in past 3 years   Hypertension    under control without medication as of 11/11/11-no meds for  at least 1 yr   Nasal congestion    Personal history of radiation therapy 02/2012   Submucous myoma of uterus    Use of megestrol  acetate (Megace )    For uterine bleeding   Uterine bleeding, dysfunctional    severe   Wheezing    hx  - at allergy season only   Past Surgical History:  Procedure Laterality Date   BREAST BIOPSY  11/14/2011   Procedure: BREAST BIOPSY WITH NEEDLE LOCALIZATION;  Surgeon: Sherlean JINNY Laughter, MD;  Location: [facility];  Service: General;  Laterality: Right;  needle localization right breast calcifications   BREAST LUMPECTOMY  11/14/11   RIGHT  BREAST=DUCTAL CA IN SITU,W/CALCIFICATIONS,HIGH GRADE,2.4CM,SURGICAL RESECTION MARGINS NEG,  DR. KYM LAUGHTER   CERVIX POLYP  10/17/11   NO MALIGNANCY   ENDOMETRIAL BIOPSY  12/09/11   UTERUS=BENIGN ENDOMETRIAL GLANDS,BENIGN SQUAMOUS EPITHELIAL CELLS,NO DYSPLASIA OR MALIGNANCY   TONSILLECTOMY     TOOTH EXTRACTION  2000   VAGINAL HYSTERECTOMY  01/26/2012    Procedure: HYSTERECTOMY VAGINAL;  Surgeon: Toribio LITTIE Campanile, MD;  Location: [facility];  Service: Gynecology;  Laterality: N/A;   VAGINAL HYSTERECTOMY     Patient Active Problem List   Diagnosis Date Noted   Hyperlipidemia 05/17/2021   GERD (gastroesophageal reflux disease) 03/27/2018   Essential hypertension 02/02/2015   Iron deficiency anemia 09/18/2012   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    REFERRING DIAG: S/p ORIF right ankle.  THERAPY DIAG:  Pain in right ankle and joints of right foot  Stiffness of right ankle, not elsewhere classified  Localized edema  Rationale for Evaluation and Treatment: Rehabilitation  ONSET DATE: 01/01/24  SUBJECTIVE:   SUBJECTIVE STATEMENT: Pt denies any pain at today.   PAIN:  Are you having pain? No  PRECAUTIONS: Other: Right CAM boot.    RED FLAGS: None   WEIGHT BEARING RESTRICTIONS: WBAT with right CAM boot donned.    FALLS:  Has patient fallen in last 6 months? Yes. Number of falls 1 (01/01/24).    LIVING ENVIRONMENT: Lives with: lives alone Lives in: House/apartment Stairs: No Has following equipment at home: Wheelchair (manual)  OCCUPATION:  Works for the school system.    PLOF: Independent  PATIENT GOALS: Get back to where she was before the injury.     OBJECTIVE:   PATIENT SURVEYS:  LEFS:  23/80.    EDEMA:  Minimal+ edema noted today (right foot and ankle).     LOWER EXTREMITY ROM:  Right ankle active dorsiflexion to -5 degrees from neutral, plantarflexion to 35 degrees, inversion to 10 degrees and eversion to neutral.    GAIT: Patient walked in clinic (20 feet) with a FWW and weight bearing as tolerated with CAM boot donned on right.    OBSERVATION:  Right ankle incisional site scabbed over.                                                                                                                                 TREATMENT DATE:    04-15-24  EXERCISE LOG  RT Ankle  Exercise Repetitions and Resistance Comments  Nustep Lvl 4 x 15 mins CAM boot donned   Seated Rocker board DF/PF x 4.5 mins, Side/Side x 4.5 mins   Seated Dyna Disc CW/CCW  x 3 mins each way   4-way Ankle  Red x 20 reps each way        Blank cell = exercise not performed today  Reviewed HEP VASO x 15 mins low pressure RT foot   PATIENT EDUCATION:  Education details: Ankle ABC's. Person educated: Patient Education method: Medical Illustrator Education comprehension: verbalized understanding and returned demonstration  HOME EXERCISE PROGRAM: As above.    ASSESSMENT:  CLINICAL IMPRESSION: Pt arrives for today's treatment session denying any pain.  Pt is ambulating with CAM boot donned and using RW.  Pt able to tolerate increased time with all previously performed exercises.  Pt introduced to resisted four way ankle exercises today with min cues for proper technique.  Normal responses to vaso noted upon removal.  Pt denied any pain upon completion of today's treatment session.    OBJECTIVE IMPAIRMENTS: Abnormal gait, decreased activity tolerance, difficulty walking, decreased ROM, increased edema, and pain.   ACTIVITY LIMITATIONS: carrying, lifting, bending, standing, and locomotion level  PARTICIPATION LIMITATIONS: meal prep, cleaning, laundry, driving, shopping, community activity, occupation, and yard work  PERSONAL FACTORS: 1 comorbidity: I and D are also affecting patient's functional outcome.   REHAB POTENTIAL: Good  CLINICAL DECISION MAKING: Stable/uncomplicated  EVALUATION COMPLEXITY: Low   GOALS:  SHORT TERM GOALS: Target date: 04/22/24  Ind with an initial HEP. Goal status: INITIAL   LONG TERM GOALS: Target date: 05/20/24 (actual timelines in accord with MD guidelines).    Ind with an advanced HEP.  Goal status: INITIAL  2.  Increase right ankle  dorsiflexion to 6- 8 degrees to normalize the patient's gait pattern.  Goal status: INITIAL  3.  Increase right ankle strength to 4+ to 5/5 to increase stability for functional tasks.  Goal status: INITIAL  4.  Walk 500 feet  with a straight cane with a normal gait cycle.  Goal status: INITIAL  5.  Perform ADL's with pain not > 3/10.  Goal status: INITIAL  6.  Improve LEFS score by at least 20 points.  Goal status: INITIAL   PLAN:  PT FREQUENCY: 2x/week  PT DURATION: 6 weeks  PLANNED INTERVENTIONS: 97110-Therapeutic exercises, 97530- Therapeutic activity, [id number]- Neuromuscular re-education, 97535- Self Care, 02859- Manual therapy, G0283- Electrical stimulation (unattended), 97016- Vasopneumatic device, Patient/Family education, and Cryotherapy  PLAN FOR NEXT SESSION: Nustep with CAM boot donned, seated BAPS (Level 1-2), seated Rockerboard (dorsi/plantarflexion), gait training.     Delon DELENA Gosling, PTA 04/15/2024, 2:40 PM  "

## 2024-04-17 ENCOUNTER — Ambulatory Visit

## 2024-04-17 DIAGNOSIS — M25571 Pain in right ankle and joints of right foot: Secondary | ICD-10-CM | POA: Diagnosis not present

## 2024-04-17 DIAGNOSIS — R600 Localized edema: Secondary | ICD-10-CM

## 2024-04-17 DIAGNOSIS — M25671 Stiffness of right ankle, not elsewhere classified: Secondary | ICD-10-CM

## 2024-04-17 NOTE — Therapy (Signed)
 " OUTPATIENT PHYSICAL THERAPY LOWER EXTREMITY TREATMENT   Patient Name: Kayla Neal MRN: [id number] DOB:09-Sep-1960, 63 y.o., female Today's Date: 04/17/2024  END OF SESSION:  PT End of Session - 04/17/24 1058     Visit Number 4    Number of Visits 12    Date for Recertification  05/20/24    PT Start Time 1054    PT Stop Time 1159    PT Time Calculation (min) 65 min          Past Medical History:  Diagnosis Date   Allergy    LISINOPRIL=N,V   Anemia 10/18/2011   placed on iron tid for Hgb of 8   Arthritis    knee pain-uses acetaminophen  for pain control prn   Asthma    seasonal allergies related-prn albuterol    Breast cancer, right breast, UIQ, 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     Cervical polyp    Fibroids    MULTIPLE IN ENDOMETRIAL CAVITY   GERD (gastroesophageal reflux disease)    no meds required in past 3 years   Hypertension    under control without medication as of 11/11/11-no meds for  at least 1 yr   Nasal congestion    Personal history of radiation therapy 02/2012   Submucous myoma of uterus    Use of megestrol  acetate (Megace )    For uterine bleeding   Uterine bleeding, dysfunctional    severe   Wheezing    hx  - at allergy season only   Past Surgical History:  Procedure Laterality Date   BREAST BIOPSY  11/14/2011   Procedure: BREAST BIOPSY WITH NEEDLE LOCALIZATION;  Surgeon: Sherlean JINNY Laughter, MD;  Location: [facility];  Service: General;  Laterality: Right;  needle localization right breast calcifications   BREAST LUMPECTOMY  11/14/11   RIGHT  BREAST=DUCTAL CA IN SITU,W/CALCIFICATIONS,HIGH GRADE,2.4CM,SURGICAL RESECTION MARGINS NEG,  DR. KYM LAUGHTER   CERVIX POLYP  10/17/11   NO MALIGNANCY   ENDOMETRIAL BIOPSY  12/09/11   UTERUS=BENIGN ENDOMETRIAL GLANDS,BENIGN SQUAMOUS EPITHELIAL CELLS,NO DYSPLASIA OR MALIGNANCY   TONSILLECTOMY     TOOTH EXTRACTION  2000   VAGINAL HYSTERECTOMY  01/26/2012    Procedure: HYSTERECTOMY VAGINAL;  Surgeon: Toribio LITTIE Campanile, MD;  Location: [facility];  Service: Gynecology;  Laterality: N/A;   VAGINAL HYSTERECTOMY     Patient Active Problem List   Diagnosis Date Noted   Hyperlipidemia 05/17/2021   GERD (gastroesophageal reflux disease) 03/27/2018   Essential hypertension 02/02/2015   Iron deficiency anemia 09/18/2012   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    REFERRING DIAG: S/p ORIF right ankle.  THERAPY DIAG:  Pain in right ankle and joints of right foot  Stiffness of right ankle, not elsewhere classified  Localized edema  Rationale for Evaluation and Treatment: Rehabilitation  ONSET DATE: 01/01/24  SUBJECTIVE:   SUBJECTIVE STATEMENT: Pt denies any pain at today.  Reports minimal soreness after last treatment session.   PAIN:  Are you having pain? No  PRECAUTIONS: Other: Right CAM boot.    RED FLAGS: None   WEIGHT BEARING RESTRICTIONS: WBAT with right CAM boot donned.    FALLS:  Has patient fallen in last 6 months? Yes. Number of falls 1 (01/01/24).    LIVING ENVIRONMENT: Lives with: lives alone Lives in: House/apartment Stairs: No Has  following equipment at home: Wheelchair (manual)  OCCUPATION: Works for the school system.    PLOF: Independent  PATIENT GOALS: Get back to where she was before the injury.     OBJECTIVE:   PATIENT SURVEYS:  LEFS:  23/80.    EDEMA:  Minimal+ edema noted today (right foot and ankle).     LOWER EXTREMITY ROM:  Right ankle active dorsiflexion to -5 degrees from neutral, plantarflexion to 35 degrees, inversion to 10 degrees and eversion to neutral.    GAIT: Patient walked in clinic (20 feet) with a FWW and weight bearing as tolerated with CAM boot donned on right.    OBSERVATION:  Right ankle incisional site scabbed over.                                                                                                                                 TREATMENT DATE:    04-17-24  EXERCISE LOG  RT Ankle  Exercise Repetitions and Resistance Comments  Nustep Lvl 5 x 15 mins CAM boot donned   Seated Rocker board DF/PF x 5 mins, Side/Side x 5 mins   Seated Dyna Disc CW/CCW  x 3.5 mins each way   4-way Ankle  Red x 20 reps each way   Towel Scrunches 20 reps    Towel Inversion/Eversion 20 reps     Blank cell = exercise not performed today  Reviewed HEP VASO x 15 mins low pressure RT foot   PATIENT EDUCATION:  Education details: Ankle ABC's. Person educated: Patient Education method: Medical Illustrator Education comprehension: verbalized understanding and returned demonstration  HOME EXERCISE PROGRAM: Access Code: [email or web address]  ASSESSMENT:  CLINICAL IMPRESSION: Pt arrives for today's treatment session denying any pain.  Pt is ambulating with CAM boot donned and using RW.  Pt reports minimal soreness after last treatment session.  Pt will be discharging today and going to another clinic after the first of the year. Pt given updated HEP and tband for home use.  Normal responses to vaso noted upon removal.  Pt encouraged to call facility with any questions or concerns.  Pt denied any pain at completion of today's treatment session.  Pt ready for discharge at this time.     OBJECTIVE IMPAIRMENTS: Abnormal gait, decreased activity tolerance, difficulty walking, decreased ROM, increased edema, and pain.   ACTIVITY LIMITATIONS: carrying, lifting, bending, standing, and locomotion level  PARTICIPATION LIMITATIONS: meal prep, cleaning, laundry, driving, shopping, community activity, occupation, and yard work  PERSONAL FACTORS: 1 comorbidity: I and D are also affecting patient's functional outcome.   REHAB POTENTIAL: Good  CLINICAL DECISION MAKING: Stable/uncomplicated  EVALUATION COMPLEXITY: Low   GOALS:  SHORT TERM GOALS:  Target date: 04/22/24  Ind with an initial HEP. Goal status: MET   LONG TERM GOALS: Target date: 05/20/24 (actual timelines in accord with MD guidelines).    Ind with an advanced HEP.  Goal status: INITIAL  2.  Increase  right ankle dorsiflexion to 6- 8 degrees to normalize the patient's gait pattern.  Goal status: INITIAL  3.  Increase right ankle strength to 4+ to 5/5 to increase stability for functional tasks.  Goal status: INITIAL  4.  Walk 500 feet with a straight cane with a normal gait cycle.  Goal status: INITIAL  5.  Perform ADL's with pain not > 3/10.  Goal status: INITIAL  6.  Improve LEFS score by at least 20 points.  Goal status: INITIAL   PLAN:  PT FREQUENCY: 2x/week  PT DURATION: 6 weeks  PLANNED INTERVENTIONS: 97110-Therapeutic exercises, 97530- Therapeutic activity, [id number]- Neuromuscular re-education, 97535- Self Care, [id number]- Manual therapy, [id number]- Electrical stimulation (unattended), [id number]- Vasopneumatic device, Patient/Family education, and Cryotherapy  PLAN FOR NEXT SESSION: Nustep with CAM boot donned, seated BAPS (Level 1-2), seated Rockerboard (dorsi/plantarflexion), gait training.     Delon DELENA Gosling, PTA 04/17/2024, 12:00 PM  "

## 2024-04-30 ENCOUNTER — Other Ambulatory Visit: Payer: Self-pay | Admitting: Hematology and Oncology

## 2024-04-30 DIAGNOSIS — C50211 Malignant neoplasm of upper-inner quadrant of right female breast: Secondary | ICD-10-CM

## 2024-05-14 ENCOUNTER — Inpatient Hospital Stay: Admitting: Hematology and Oncology

## 2024-05-30 ENCOUNTER — Inpatient Hospital Stay: Admitting: Hematology and Oncology
